# Patient Record
Sex: Female | Born: 1945 | Race: Asian | Hispanic: No | Smoking: Former smoker
Health system: Southern US, Community
[De-identification: ages and names within clinical notes are randomized; demographics above are authoritative.]

## PROBLEM LIST (undated history)

## (undated) DIAGNOSIS — K219 Gastro-esophageal reflux disease without esophagitis: Secondary | ICD-10-CM

## (undated) DIAGNOSIS — I10 Essential (primary) hypertension: Secondary | ICD-10-CM

## (undated) DIAGNOSIS — R1013 Epigastric pain: Secondary | ICD-10-CM

## (undated) DIAGNOSIS — C49A0 Gastrointestinal stromal tumor, unspecified site: Secondary | ICD-10-CM

## (undated) DIAGNOSIS — M1990 Unspecified osteoarthritis, unspecified site: Secondary | ICD-10-CM

## (undated) DIAGNOSIS — K648 Other hemorrhoids: Secondary | ICD-10-CM

## (undated) DIAGNOSIS — R42 Dizziness and giddiness: Secondary | ICD-10-CM

## (undated) DIAGNOSIS — C801 Malignant (primary) neoplasm, unspecified: Secondary | ICD-10-CM

## (undated) DIAGNOSIS — K859 Acute pancreatitis without necrosis or infection, unspecified: Principal | ICD-10-CM

## (undated) HISTORY — DX: Unspecified osteoarthritis, unspecified site: M19.90

## (undated) HISTORY — PX: OTHER SURGICAL HISTORY: SHX169

## (undated) HISTORY — DX: Other hemorrhoids: K64.8

## (undated) HISTORY — PX: CHOLECYSTECTOMY: SHX55

## (undated) HISTORY — DX: Gastrointestinal stromal tumor, unspecified site: C49.A0

## (undated) HISTORY — DX: Epigastric pain: R10.13

## (undated) HISTORY — PX: ESOPHAGOGASTRODUODENOSCOPY: SHX1529

## (undated) HISTORY — PX: COLONOSCOPY: SHX174

## (undated) HISTORY — DX: Gastro-esophageal reflux disease without esophagitis: K21.9

## (undated) HISTORY — DX: Essential (primary) hypertension: I10

## (undated) HISTORY — DX: Dizziness and giddiness: R42

---

## 2000-04-05 ENCOUNTER — Emergency Department (HOSPITAL_COMMUNITY): Admission: EM | Admit: 2000-04-05 | Discharge: 2000-04-05 | Payer: Self-pay | Admitting: Emergency Medicine

## 2000-04-05 ENCOUNTER — Encounter: Payer: Self-pay | Admitting: Emergency Medicine

## 2000-06-30 ENCOUNTER — Emergency Department (HOSPITAL_COMMUNITY): Admission: EM | Admit: 2000-06-30 | Discharge: 2000-06-30 | Payer: Self-pay | Admitting: Emergency Medicine

## 2000-06-30 ENCOUNTER — Encounter: Payer: Self-pay | Admitting: Emergency Medicine

## 2000-07-14 ENCOUNTER — Encounter: Admission: RE | Admit: 2000-07-14 | Discharge: 2000-07-14 | Payer: Self-pay | Admitting: Internal Medicine

## 2000-07-14 ENCOUNTER — Encounter: Payer: Self-pay | Admitting: Internal Medicine

## 2000-08-09 ENCOUNTER — Other Ambulatory Visit: Admission: RE | Admit: 2000-08-09 | Discharge: 2000-08-09 | Payer: Self-pay | Admitting: Obstetrics and Gynecology

## 2000-08-18 ENCOUNTER — Encounter: Payer: Self-pay | Admitting: Obstetrics and Gynecology

## 2000-08-18 ENCOUNTER — Ambulatory Visit (HOSPITAL_COMMUNITY): Admission: RE | Admit: 2000-08-18 | Discharge: 2000-08-18 | Payer: Self-pay | Admitting: Obstetrics and Gynecology

## 2000-09-01 ENCOUNTER — Ambulatory Visit (HOSPITAL_COMMUNITY): Admission: RE | Admit: 2000-09-01 | Discharge: 2000-09-01 | Payer: Self-pay | Admitting: Gastroenterology

## 2000-09-01 ENCOUNTER — Encounter (INDEPENDENT_AMBULATORY_CARE_PROVIDER_SITE_OTHER): Payer: Self-pay | Admitting: *Deleted

## 2000-09-24 ENCOUNTER — Emergency Department (HOSPITAL_COMMUNITY): Admission: EM | Admit: 2000-09-24 | Discharge: 2000-09-24 | Payer: Self-pay | Admitting: Emergency Medicine

## 2000-09-24 ENCOUNTER — Encounter: Payer: Self-pay | Admitting: Emergency Medicine

## 2007-02-23 ENCOUNTER — Emergency Department (HOSPITAL_COMMUNITY): Admission: EM | Admit: 2007-02-23 | Discharge: 2007-02-23 | Payer: Self-pay | Admitting: Emergency Medicine

## 2007-03-15 ENCOUNTER — Inpatient Hospital Stay (HOSPITAL_COMMUNITY): Admission: EM | Admit: 2007-03-15 | Discharge: 2007-03-26 | Payer: Self-pay | Admitting: Emergency Medicine

## 2007-03-16 ENCOUNTER — Encounter: Payer: Self-pay | Admitting: Internal Medicine

## 2007-03-16 DIAGNOSIS — Z85028 Personal history of other malignant neoplasm of stomach: Secondary | ICD-10-CM | POA: Insufficient documentation

## 2007-03-18 ENCOUNTER — Encounter (INDEPENDENT_AMBULATORY_CARE_PROVIDER_SITE_OTHER): Payer: Self-pay | Admitting: General Surgery

## 2007-03-18 ENCOUNTER — Encounter: Payer: Self-pay | Admitting: Internal Medicine

## 2007-03-18 DIAGNOSIS — C49A0 Gastrointestinal stromal tumor, unspecified site: Secondary | ICD-10-CM

## 2007-03-18 HISTORY — DX: Gastrointestinal stromal tumor, unspecified site: C49.A0

## 2007-03-21 ENCOUNTER — Ambulatory Visit: Payer: Self-pay | Admitting: Internal Medicine

## 2007-04-06 ENCOUNTER — Encounter: Admission: RE | Admit: 2007-04-06 | Discharge: 2007-04-06 | Payer: Self-pay | Admitting: General Surgery

## 2007-04-06 ENCOUNTER — Inpatient Hospital Stay (HOSPITAL_COMMUNITY): Admission: EM | Admit: 2007-04-06 | Discharge: 2007-04-11 | Payer: Self-pay | Admitting: General Surgery

## 2007-04-11 ENCOUNTER — Encounter (INDEPENDENT_AMBULATORY_CARE_PROVIDER_SITE_OTHER): Payer: Self-pay | Admitting: *Deleted

## 2007-04-17 ENCOUNTER — Ambulatory Visit: Payer: Self-pay | Admitting: Gastroenterology

## 2007-04-20 HISTORY — PX: BREAST BIOPSY: SHX20

## 2007-04-26 ENCOUNTER — Encounter: Admission: RE | Admit: 2007-04-26 | Discharge: 2007-04-26 | Payer: Self-pay | Admitting: General Surgery

## 2007-04-29 ENCOUNTER — Inpatient Hospital Stay (HOSPITAL_COMMUNITY): Admission: EM | Admit: 2007-04-29 | Discharge: 2007-05-09 | Payer: Self-pay | Admitting: Emergency Medicine

## 2007-05-01 ENCOUNTER — Encounter (INDEPENDENT_AMBULATORY_CARE_PROVIDER_SITE_OTHER): Payer: Self-pay | Admitting: *Deleted

## 2007-05-03 ENCOUNTER — Encounter (INDEPENDENT_AMBULATORY_CARE_PROVIDER_SITE_OTHER): Payer: Self-pay | Admitting: *Deleted

## 2007-05-31 ENCOUNTER — Ambulatory Visit: Payer: Self-pay | Admitting: Hematology and Oncology

## 2007-06-01 ENCOUNTER — Encounter: Payer: Self-pay | Admitting: Internal Medicine

## 2007-06-01 LAB — COMPREHENSIVE METABOLIC PANEL
ALT: 17 U/L (ref 0–35)
Albumin: 4.3 g/dL (ref 3.5–5.2)
CO2: 27 mEq/L (ref 19–32)
Chloride: 107 mEq/L (ref 96–112)
Potassium: 4.3 mEq/L (ref 3.5–5.3)
Sodium: 142 mEq/L (ref 135–145)
Total Bilirubin: 0.7 mg/dL (ref 0.3–1.2)
Total Protein: 6.8 g/dL (ref 6.0–8.3)

## 2007-06-01 LAB — CBC WITH DIFFERENTIAL/PLATELET
BASO%: 0.4 % (ref 0.0–2.0)
Eosinophils Absolute: 0.1 10*3/uL (ref 0.0–0.5)
LYMPH%: 23.5 % (ref 14.0–48.0)
MCHC: 34.6 g/dL (ref 32.0–36.0)
MONO#: 0.3 10*3/uL (ref 0.1–0.9)
NEUT#: 2.3 10*3/uL (ref 1.5–6.5)
RBC: 4.06 10*6/uL (ref 3.70–5.32)
RDW: 14.3 % (ref 11.3–14.5)
WBC: 3.5 10*3/uL — ABNORMAL LOW (ref 3.9–10.0)
lymph#: 0.8 10*3/uL — ABNORMAL LOW (ref 0.9–3.3)

## 2007-06-01 LAB — LACTATE DEHYDROGENASE: LDH: 132 U/L (ref 94–250)

## 2007-06-08 ENCOUNTER — Ambulatory Visit (HOSPITAL_COMMUNITY): Admission: RE | Admit: 2007-06-08 | Discharge: 2007-06-08 | Payer: Self-pay | Admitting: Hematology and Oncology

## 2007-06-09 ENCOUNTER — Ambulatory Visit: Payer: Self-pay | Admitting: Internal Medicine

## 2007-06-13 ENCOUNTER — Ambulatory Visit: Payer: Self-pay | Admitting: Internal Medicine

## 2007-06-22 LAB — CBC WITH DIFFERENTIAL/PLATELET
BASO%: 0 % (ref 0.0–2.0)
EOS%: 4.2 % (ref 0.0–7.0)
LYMPH%: 23.3 % (ref 14.0–48.0)
MCHC: 34.3 g/dL (ref 32.0–36.0)
MCV: 83.6 fL (ref 81.0–101.0)
MONO%: 10.4 % (ref 0.0–13.0)
NEUT#: 3.2 10*3/uL (ref 1.5–6.5)
Platelets: 191 10*3/uL (ref 145–400)
RBC: 4.78 10*6/uL (ref 3.70–5.32)
RDW: 13.3 % (ref 11.3–14.5)

## 2007-06-28 ENCOUNTER — Ambulatory Visit (HOSPITAL_COMMUNITY): Admission: RE | Admit: 2007-06-28 | Discharge: 2007-06-28 | Payer: Self-pay | Admitting: Hematology and Oncology

## 2007-06-28 ENCOUNTER — Encounter: Admission: RE | Admit: 2007-06-28 | Discharge: 2007-06-28 | Payer: Self-pay | Admitting: Hematology and Oncology

## 2007-06-28 ENCOUNTER — Encounter (INDEPENDENT_AMBULATORY_CARE_PROVIDER_SITE_OTHER): Payer: Self-pay | Admitting: Diagnostic Radiology

## 2007-07-03 ENCOUNTER — Encounter (INDEPENDENT_AMBULATORY_CARE_PROVIDER_SITE_OTHER): Payer: Self-pay | Admitting: *Deleted

## 2007-07-03 ENCOUNTER — Ambulatory Visit (HOSPITAL_COMMUNITY): Admission: RE | Admit: 2007-07-03 | Discharge: 2007-07-03 | Payer: Self-pay | Admitting: Hematology and Oncology

## 2007-08-07 ENCOUNTER — Ambulatory Visit: Payer: Self-pay | Admitting: Hematology and Oncology

## 2007-08-09 LAB — CBC WITH DIFFERENTIAL/PLATELET
BASO%: 0.4 % (ref 0.0–2.0)
HCT: 36.3 % (ref 34.8–46.6)
MCHC: 34.3 g/dL (ref 32.0–36.0)
MONO#: 0.3 10*3/uL (ref 0.1–0.9)
RBC: 4.35 10*6/uL (ref 3.70–5.32)
WBC: 5.1 10*3/uL (ref 3.9–10.0)
lymph#: 1.1 10*3/uL (ref 0.9–3.3)

## 2007-08-09 LAB — COMPREHENSIVE METABOLIC PANEL
ALT: 16 U/L (ref 0–35)
CO2: 25 mEq/L (ref 19–32)
Calcium: 8.9 mg/dL (ref 8.4–10.5)
Chloride: 104 mEq/L (ref 96–112)
Sodium: 138 mEq/L (ref 135–145)
Total Protein: 7.3 g/dL (ref 6.0–8.3)

## 2007-08-24 ENCOUNTER — Encounter: Payer: Self-pay | Admitting: Internal Medicine

## 2007-09-19 LAB — CBC WITH DIFFERENTIAL/PLATELET
BASO%: 0.5 % (ref 0.0–2.0)
Eosinophils Absolute: 0.1 10*3/uL (ref 0.0–0.5)
LYMPH%: 28.9 % (ref 14.0–48.0)
MCHC: 34.5 g/dL (ref 32.0–36.0)
MONO#: 0.2 10*3/uL (ref 0.1–0.9)
NEUT#: 1.8 10*3/uL (ref 1.5–6.5)
RBC: 4.24 10*6/uL (ref 3.70–5.32)
RDW: 18.6 % — ABNORMAL HIGH (ref 11.3–14.5)
WBC: 3.1 10*3/uL — ABNORMAL LOW (ref 3.9–10.0)
lymph#: 0.9 10*3/uL (ref 0.9–3.3)

## 2007-10-02 ENCOUNTER — Ambulatory Visit: Payer: Self-pay | Admitting: Hematology and Oncology

## 2007-10-04 ENCOUNTER — Encounter: Payer: Self-pay | Admitting: Internal Medicine

## 2007-10-04 LAB — COMPREHENSIVE METABOLIC PANEL
ALT: 13 U/L (ref 0–35)
CO2: 22 mEq/L (ref 19–32)
Calcium: 8.8 mg/dL (ref 8.4–10.5)
Chloride: 108 mEq/L (ref 96–112)
Creatinine, Ser: 0.87 mg/dL (ref 0.40–1.20)
Glucose, Bld: 133 mg/dL — ABNORMAL HIGH (ref 70–99)
Total Protein: 7 g/dL (ref 6.0–8.3)

## 2007-10-04 LAB — CBC WITH DIFFERENTIAL/PLATELET
BASO%: 0.3 % (ref 0.0–2.0)
Eosinophils Absolute: 0.1 10*3/uL (ref 0.0–0.5)
HCT: 35.6 % (ref 34.8–46.6)
HGB: 12.2 g/dL (ref 11.6–15.9)
MCHC: 34.4 g/dL (ref 32.0–36.0)
MONO#: 0.2 10*3/uL (ref 0.1–0.9)
NEUT#: 1.2 10*3/uL — ABNORMAL LOW (ref 1.5–6.5)
NEUT%: 49.4 % (ref 39.6–76.8)
WBC: 2.3 10*3/uL — ABNORMAL LOW (ref 3.9–10.0)
lymph#: 0.9 10*3/uL (ref 0.9–3.3)

## 2007-10-23 ENCOUNTER — Encounter: Payer: Self-pay | Admitting: Internal Medicine

## 2007-12-01 ENCOUNTER — Ambulatory Visit: Payer: Self-pay | Admitting: Hematology and Oncology

## 2007-12-05 ENCOUNTER — Encounter: Payer: Self-pay | Admitting: Internal Medicine

## 2007-12-05 LAB — CBC WITH DIFFERENTIAL/PLATELET
BASO%: 0.4 % (ref 0.0–2.0)
Eosinophils Absolute: 0.1 10*3/uL (ref 0.0–0.5)
HCT: 33.6 % — ABNORMAL LOW (ref 34.8–46.6)
LYMPH%: 29.6 % (ref 14.0–48.0)
MCHC: 34.8 g/dL (ref 32.0–36.0)
MONO#: 0.2 10*3/uL (ref 0.1–0.9)
NEUT#: 1.5 10*3/uL (ref 1.5–6.5)
NEUT%: 59.1 % (ref 39.6–76.8)
Platelets: 104 10*3/uL — ABNORMAL LOW (ref 145–400)
RBC: 3.64 10*6/uL — ABNORMAL LOW (ref 3.70–5.32)
WBC: 2.5 10*3/uL — ABNORMAL LOW (ref 3.9–10.0)
lymph#: 0.7 10*3/uL — ABNORMAL LOW (ref 0.9–3.3)

## 2007-12-05 LAB — COMPREHENSIVE METABOLIC PANEL
ALT: 15 U/L (ref 0–35)
CO2: 24 mEq/L (ref 19–32)
Calcium: 8.4 mg/dL (ref 8.4–10.5)
Chloride: 109 mEq/L (ref 96–112)
Glucose, Bld: 108 mg/dL — ABNORMAL HIGH (ref 70–99)
Sodium: 143 mEq/L (ref 135–145)
Total Protein: 6.4 g/dL (ref 6.0–8.3)

## 2008-02-05 ENCOUNTER — Ambulatory Visit (HOSPITAL_COMMUNITY): Admission: RE | Admit: 2008-02-05 | Discharge: 2008-02-05 | Payer: Self-pay | Admitting: Hematology and Oncology

## 2008-02-05 ENCOUNTER — Encounter (INDEPENDENT_AMBULATORY_CARE_PROVIDER_SITE_OTHER): Payer: Self-pay | Admitting: *Deleted

## 2008-02-12 ENCOUNTER — Ambulatory Visit: Payer: Self-pay | Admitting: Hematology and Oncology

## 2008-02-14 ENCOUNTER — Encounter: Payer: Self-pay | Admitting: Internal Medicine

## 2008-02-14 LAB — CBC WITH DIFFERENTIAL/PLATELET
Basophils Absolute: 0 10*3/uL (ref 0.0–0.1)
Eosinophils Absolute: 0.3 10*3/uL (ref 0.0–0.5)
HCT: 33.9 % — ABNORMAL LOW (ref 34.8–46.6)
HGB: 11.4 g/dL — ABNORMAL LOW (ref 11.6–15.9)
LYMPH%: 22.1 % (ref 14.0–48.0)
MONO#: 0.3 10*3/uL (ref 0.1–0.9)
NEUT#: 3.2 10*3/uL (ref 1.5–6.5)
NEUT%: 65.7 % (ref 39.6–76.8)
Platelets: 127 10*3/uL — ABNORMAL LOW (ref 145–400)
WBC: 4.8 10*3/uL (ref 3.9–10.0)
lymph#: 1.1 10*3/uL (ref 0.9–3.3)

## 2008-02-14 LAB — COMPREHENSIVE METABOLIC PANEL
ALT: 15 U/L (ref 0–35)
CO2: 25 mEq/L (ref 19–32)
Calcium: 8.6 mg/dL (ref 8.4–10.5)
Chloride: 111 mEq/L (ref 96–112)
Creatinine, Ser: 1.01 mg/dL (ref 0.40–1.20)
Glucose, Bld: 103 mg/dL — ABNORMAL HIGH (ref 70–99)
Total Bilirubin: 0.6 mg/dL (ref 0.3–1.2)

## 2008-03-04 ENCOUNTER — Ambulatory Visit: Payer: Self-pay | Admitting: Internal Medicine

## 2008-03-08 ENCOUNTER — Encounter (INDEPENDENT_AMBULATORY_CARE_PROVIDER_SITE_OTHER): Payer: Self-pay | Admitting: *Deleted

## 2008-03-08 ENCOUNTER — Ambulatory Visit: Payer: Self-pay | Admitting: Gastroenterology

## 2008-03-08 ENCOUNTER — Inpatient Hospital Stay (HOSPITAL_COMMUNITY): Admission: EM | Admit: 2008-03-08 | Discharge: 2008-03-11 | Payer: Self-pay | Admitting: Emergency Medicine

## 2008-03-10 ENCOUNTER — Ambulatory Visit: Payer: Self-pay | Admitting: Hematology and Oncology

## 2008-03-11 ENCOUNTER — Encounter: Payer: Self-pay | Admitting: Gastroenterology

## 2008-03-11 ENCOUNTER — Encounter: Payer: Self-pay | Admitting: Internal Medicine

## 2008-03-22 ENCOUNTER — Ambulatory Visit: Payer: Self-pay | Admitting: Internal Medicine

## 2008-03-22 DIAGNOSIS — R1013 Epigastric pain: Secondary | ICD-10-CM | POA: Insufficient documentation

## 2008-03-22 DIAGNOSIS — R112 Nausea with vomiting, unspecified: Secondary | ICD-10-CM | POA: Insufficient documentation

## 2008-03-22 HISTORY — DX: Epigastric pain: R10.13

## 2008-03-29 ENCOUNTER — Ambulatory Visit (HOSPITAL_COMMUNITY): Admission: RE | Admit: 2008-03-29 | Discharge: 2008-03-29 | Payer: Self-pay | Admitting: Internal Medicine

## 2008-04-01 ENCOUNTER — Ambulatory Visit: Payer: Self-pay | Admitting: Internal Medicine

## 2008-04-01 DIAGNOSIS — G4700 Insomnia, unspecified: Secondary | ICD-10-CM | POA: Insufficient documentation

## 2008-04-04 ENCOUNTER — Telehealth: Payer: Self-pay | Admitting: Internal Medicine

## 2008-04-05 ENCOUNTER — Ambulatory Visit: Payer: Self-pay | Admitting: Hematology and Oncology

## 2008-04-09 LAB — CBC WITH DIFFERENTIAL/PLATELET
BASO%: 0.5 % (ref 0.0–2.0)
Eosinophils Absolute: 0.1 10*3/uL (ref 0.0–0.5)
LYMPH%: 34.4 % (ref 14.0–48.0)
MCHC: 34.8 g/dL (ref 32.0–36.0)
MONO#: 0.2 10*3/uL (ref 0.1–0.9)
NEUT#: 1.6 10*3/uL (ref 1.5–6.5)
Platelets: 132 10*3/uL — ABNORMAL LOW (ref 145–400)
RBC: 3.7 10*6/uL (ref 3.70–5.32)
RDW: 13.7 % (ref 11.3–14.5)
WBC: 3 10*3/uL — ABNORMAL LOW (ref 3.9–10.0)
lymph#: 1 10*3/uL (ref 0.9–3.3)

## 2008-06-04 ENCOUNTER — Ambulatory Visit: Payer: Self-pay | Admitting: Internal Medicine

## 2008-06-04 DIAGNOSIS — R51 Headache: Secondary | ICD-10-CM | POA: Insufficient documentation

## 2008-06-04 DIAGNOSIS — R42 Dizziness and giddiness: Secondary | ICD-10-CM | POA: Insufficient documentation

## 2008-06-04 DIAGNOSIS — R519 Headache, unspecified: Secondary | ICD-10-CM | POA: Insufficient documentation

## 2008-06-04 LAB — CONVERTED CEMR LAB
BUN: 16 mg/dL (ref 6–23)
Creatinine, Ser: 0.8 mg/dL (ref 0.4–1.2)

## 2008-06-09 ENCOUNTER — Encounter: Admission: RE | Admit: 2008-06-09 | Discharge: 2008-06-09 | Payer: Self-pay | Admitting: Internal Medicine

## 2008-06-10 ENCOUNTER — Ambulatory Visit: Payer: Self-pay | Admitting: Hematology and Oncology

## 2008-06-12 ENCOUNTER — Encounter: Payer: Self-pay | Admitting: Internal Medicine

## 2008-06-12 LAB — CBC WITH DIFFERENTIAL/PLATELET
Basophils Absolute: 0 10*3/uL (ref 0.0–0.1)
Eosinophils Absolute: 0.2 10*3/uL (ref 0.0–0.5)
HGB: 12.2 g/dL (ref 11.6–15.9)
LYMPH%: 23.7 % (ref 14.0–49.7)
MCV: 92 fL (ref 79.5–101.0)
MONO#: 0.3 10*3/uL (ref 0.1–0.9)
MONO%: 8.8 % (ref 0.0–14.0)
NEUT#: 1.9 10*3/uL (ref 1.5–6.5)
Platelets: 171 10*3/uL (ref 145–400)

## 2008-06-12 LAB — COMPREHENSIVE METABOLIC PANEL
Albumin: 4.4 g/dL (ref 3.5–5.2)
Alkaline Phosphatase: 81 U/L (ref 39–117)
BUN: 20 mg/dL (ref 6–23)
CO2: 25 mEq/L (ref 19–32)
Glucose, Bld: 103 mg/dL — ABNORMAL HIGH (ref 70–99)
Potassium: 4.2 mEq/L (ref 3.5–5.3)
Total Bilirubin: 0.5 mg/dL (ref 0.3–1.2)

## 2008-06-12 LAB — LACTATE DEHYDROGENASE: LDH: 154 U/L (ref 94–250)

## 2008-07-01 ENCOUNTER — Encounter: Payer: Self-pay | Admitting: Internal Medicine

## 2008-09-30 ENCOUNTER — Ambulatory Visit: Payer: Self-pay | Admitting: Hematology and Oncology

## 2008-10-02 ENCOUNTER — Encounter (INDEPENDENT_AMBULATORY_CARE_PROVIDER_SITE_OTHER): Payer: Self-pay | Admitting: *Deleted

## 2008-10-02 ENCOUNTER — Ambulatory Visit (HOSPITAL_COMMUNITY): Admission: RE | Admit: 2008-10-02 | Discharge: 2008-10-02 | Payer: Self-pay | Admitting: Hematology and Oncology

## 2008-10-02 LAB — CBC WITH DIFFERENTIAL/PLATELET
Basophils Absolute: 0 10*3/uL (ref 0.0–0.1)
Eosinophils Absolute: 0 10*3/uL (ref 0.0–0.5)
HGB: 14.8 g/dL (ref 11.6–15.9)
LYMPH%: 17.8 % (ref 14.0–49.7)
MCV: 87 fL (ref 79.5–101.0)
MONO%: 10.9 % (ref 0.0–14.0)
NEUT#: 2.4 10*3/uL (ref 1.5–6.5)
NEUT%: 69.4 % (ref 38.4–76.8)
Platelets: 149 10*3/uL (ref 145–400)
RBC: 4.89 10*6/uL (ref 3.70–5.45)

## 2008-10-02 LAB — COMPREHENSIVE METABOLIC PANEL
Alkaline Phosphatase: 88 U/L (ref 39–117)
BUN: 12 mg/dL (ref 6–23)
Creatinine, Ser: 0.69 mg/dL (ref 0.40–1.20)
Glucose, Bld: 106 mg/dL — ABNORMAL HIGH (ref 70–99)
Total Bilirubin: 0.9 mg/dL (ref 0.3–1.2)

## 2008-10-06 ENCOUNTER — Encounter (INDEPENDENT_AMBULATORY_CARE_PROVIDER_SITE_OTHER): Payer: Self-pay | Admitting: *Deleted

## 2008-10-06 ENCOUNTER — Emergency Department (HOSPITAL_COMMUNITY): Admission: EM | Admit: 2008-10-06 | Discharge: 2008-10-07 | Payer: Self-pay | Admitting: Emergency Medicine

## 2008-10-09 ENCOUNTER — Encounter: Payer: Self-pay | Admitting: Internal Medicine

## 2009-03-28 ENCOUNTER — Ambulatory Visit: Payer: Self-pay | Admitting: Hematology and Oncology

## 2009-04-01 ENCOUNTER — Ambulatory Visit (HOSPITAL_COMMUNITY): Admission: RE | Admit: 2009-04-01 | Discharge: 2009-04-01 | Payer: Self-pay | Admitting: Hematology and Oncology

## 2009-04-01 ENCOUNTER — Encounter (INDEPENDENT_AMBULATORY_CARE_PROVIDER_SITE_OTHER): Payer: Self-pay | Admitting: *Deleted

## 2009-04-01 LAB — CBC WITH DIFFERENTIAL/PLATELET
Basophils Absolute: 0 10*3/uL (ref 0.0–0.1)
EOS%: 2.8 % (ref 0.0–7.0)
MCH: 30.8 pg (ref 25.1–34.0)
MCHC: 34.4 g/dL (ref 31.5–36.0)
MCV: 89.5 fL (ref 79.5–101.0)
MONO%: 7.9 % (ref 0.0–14.0)
RBC: 4.47 10*6/uL (ref 3.70–5.45)
RDW: 12.8 % (ref 11.2–14.5)

## 2009-04-01 LAB — COMPREHENSIVE METABOLIC PANEL
AST: 25 U/L (ref 0–37)
Albumin: 4 g/dL (ref 3.5–5.2)
Alkaline Phosphatase: 78 U/L (ref 39–117)
BUN: 14 mg/dL (ref 6–23)
Potassium: 4.7 mEq/L (ref 3.5–5.3)

## 2009-04-28 ENCOUNTER — Ambulatory Visit: Payer: Self-pay | Admitting: Hematology and Oncology

## 2009-04-30 ENCOUNTER — Encounter: Payer: Self-pay | Admitting: Internal Medicine

## 2009-08-20 ENCOUNTER — Ambulatory Visit: Payer: Self-pay | Admitting: Hematology and Oncology

## 2009-08-21 ENCOUNTER — Encounter (INDEPENDENT_AMBULATORY_CARE_PROVIDER_SITE_OTHER): Payer: Self-pay | Admitting: *Deleted

## 2009-08-21 ENCOUNTER — Ambulatory Visit (HOSPITAL_COMMUNITY): Admission: RE | Admit: 2009-08-21 | Discharge: 2009-08-21 | Payer: Self-pay | Admitting: Hematology and Oncology

## 2009-08-21 LAB — CBC WITH DIFFERENTIAL/PLATELET
BASO%: 0.5 % (ref 0.0–2.0)
LYMPH%: 24.7 % (ref 14.0–49.7)
MCHC: 34.2 g/dL (ref 31.5–36.0)
MONO#: 0.4 10*3/uL (ref 0.1–0.9)
Platelets: 182 10*3/uL (ref 145–400)
RBC: 4.76 10*6/uL (ref 3.70–5.45)
WBC: 4 10*3/uL (ref 3.9–10.3)

## 2009-08-21 LAB — COMPREHENSIVE METABOLIC PANEL
ALT: 20 U/L (ref 0–35)
Alkaline Phosphatase: 106 U/L (ref 39–117)
CO2: 28 mEq/L (ref 19–32)
Sodium: 140 mEq/L (ref 135–145)
Total Bilirubin: 1.1 mg/dL (ref 0.3–1.2)
Total Protein: 7.8 g/dL (ref 6.0–8.3)

## 2009-08-21 LAB — LACTATE DEHYDROGENASE: LDH: 142 U/L (ref 94–250)

## 2009-08-27 ENCOUNTER — Encounter: Payer: Self-pay | Admitting: Internal Medicine

## 2010-02-20 ENCOUNTER — Ambulatory Visit: Payer: Self-pay | Admitting: Hematology and Oncology

## 2010-02-24 LAB — COMPREHENSIVE METABOLIC PANEL
ALT: 27 U/L (ref 0–35)
AST: 30 U/L (ref 0–37)
Creatinine, Ser: 0.73 mg/dL (ref 0.40–1.20)
Total Bilirubin: 0.8 mg/dL (ref 0.3–1.2)

## 2010-02-24 LAB — CBC WITH DIFFERENTIAL/PLATELET
BASO%: 0.4 % (ref 0.0–2.0)
EOS%: 2.9 % (ref 0.0–7.0)
HCT: 41.2 % (ref 34.8–46.6)
LYMPH%: 24.7 % (ref 14.0–49.7)
MCH: 29.6 pg (ref 25.1–34.0)
MCHC: 34.1 g/dL (ref 31.5–36.0)
MCV: 86.9 fL (ref 79.5–101.0)
MONO%: 7.2 % (ref 0.0–14.0)
NEUT%: 64.8 % (ref 38.4–76.8)
Platelets: 185 10*3/uL (ref 145–400)

## 2010-03-05 ENCOUNTER — Encounter: Payer: Self-pay | Admitting: Internal Medicine

## 2010-04-17 ENCOUNTER — Ambulatory Visit: Payer: Self-pay | Admitting: Internal Medicine

## 2010-04-21 ENCOUNTER — Ambulatory Visit
Admission: RE | Admit: 2010-04-21 | Discharge: 2010-04-21 | Payer: Self-pay | Source: Home / Self Care | Attending: Internal Medicine | Admitting: Internal Medicine

## 2010-04-21 ENCOUNTER — Encounter: Payer: Self-pay | Admitting: Internal Medicine

## 2010-04-24 ENCOUNTER — Encounter: Payer: Self-pay | Admitting: Internal Medicine

## 2010-05-09 ENCOUNTER — Other Ambulatory Visit: Payer: Self-pay | Admitting: Hematology and Oncology

## 2010-05-09 DIAGNOSIS — C49A0 Gastrointestinal stromal tumor, unspecified site: Secondary | ICD-10-CM

## 2010-05-10 ENCOUNTER — Encounter: Payer: Self-pay | Admitting: Hematology and Oncology

## 2010-05-10 ENCOUNTER — Encounter: Payer: Self-pay | Admitting: Internal Medicine

## 2010-05-21 NOTE — Procedures (Signed)
Summary: Upper Endoscopy  Patient: [name] Note: All result statuses are Final unless otherwise noted.  Tests: (1) Upper Endoscopy (EGD)   EGD Upper Endoscopy       DONE     [facility]     [address]     [place], [place]  [address]           ENDOSCOPY PROCEDURE REPORT           PATIENT:  Dawn, [name]  MR#:  #[id number]     BIRTHDATE:  [birth year], 64 yrs. 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  The [facility] [id number] endoscope was     introduced through the mouth and advanced to the second portion of     the duodenum, without limitations.  The instrument was slowly     withdrawn as the mucosa was fully examined.     <<PROCEDUREIMAGES>>           There was stenosis in the body and the antrum of the stomach.     Persistent stenosis of body-antrum junction in area of prior wedge     resection of (GIST).  There were columnar-type mucosal changes in     the distal esophagus, that could represent Barrett's esophagus.     Tiny  area of columnar change just above z-line (40 cm). With     standard forceps, a biopsy was obtained and sent to pathology.     Otherwise the examination was normal.    Retroflexed views revealed     no abnormalities.    The scope was then withdrawn from the patient     and the procedure completed.           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 Marvell Fuller office and schedule a follow-up     appointment for late February or early March           REPEAT EXAM:  await biopsies to determine           [name],  MD, [facility]           CC:  Arlan Organ, MD     Maurice Small, MD     Claud Kelp, MD     The Patient           n.     eSIGNED:   [name] at 04/21/2010 11:25 AM           Calla Kicks, #[id number]  Note: An exclamation mark (!) indicates a result that was not dispersed into the flowsheet. Document Creation Date: 04/22/2010 9:14 AM _______________________________________________________________________  (1) Order result status: Final Collection or observation date-time: 04/21/2010 11:08 Requested date-time:  Receipt date-time:  Reported date-time:  Referring Physician:   Ordering Physician: Stan Head [phone]) Specimen Source:  Source: Launa Grill Order Number: [id number] Lab site:

## 2010-05-21 NOTE — Letter (Signed)
Summary: EGD Instructions  [facility]  [address] [place], [place] [address]   Phone: [phone]  Fax: [phone]       [name]    23-Feb-1946    MRN: [id number]       Procedure Day /Date: Tuesday 04/21/10     Arrival Time: 9:30 am     Procedure Time: 10:30 am     Location of Procedure:                    _ x _  Endoscopy Center (4th Floor)  PREPARATION FOR ENDOSCOPY   On 04/21/10 THE DAY OF THE PROCEDURE:  1.   No solid foods, milk or milk products are allowed after midnight the night before your procedure.  2.   Do not drink anything colored red or purple.  Avoid juices with pulp.  No orange juice.  3.  You may drink clear liquids until 8:30 am, which is 2 hours before your procedure.                                                                                                CLEAR LIQUIDS INCLUDE: Water Jello Ice Popsicles Tea (sugar ok, no milk/cream) Powdered fruit flavored drinks Coffee (sugar ok, no milk/cream) Gatorade Juice: apple, white grape, white cranberry  Lemonade Clear bullion, consomm, broth Carbonated beverages (any kind) Strained chicken noodle soup Hard Candy   MEDICATION INSTRUCTIONS  Unless otherwise instructed, you should take regular prescription medications with a small sip of water as early as possible the morning of your procedure.                   OTHER INSTRUCTIONS  You will need a responsible adult at least 64 years of age to accompany you and drive you home.   This person must remain in the waiting room during your procedure.  Wear loose fitting clothing that is easily removed.  Leave jewelry and other valuables at home.  However, you may wish to bring a book to read or an iPod/MP3 player to listen to music as you wait for your procedure to start.  Remove all body piercing jewelry and leave at home.  Total time from sign-in until discharge is approximately 2-3 hours.  You should  go home directly after your procedure and rest.  You can resume normal activities the day after your procedure.  The day of your procedure you should not:   Drive   Make legal decisions   Operate machinery   Drink alcohol   Return to work  You will receive specific instructions about eating, activities and medications before you leave.    The above instructions have been reviewed and explained to me by   Lamona Curl CMA Duncan Dull)  April 17, 2010 11:11 AM     I fully understand and can verbalize these instructions _____________________________ Date12/30/11

## 2010-05-21 NOTE — Discharge Summary (Signed)
Summary: Partial Gastric Outlet Obstruction, Gastroparesis  NAME:  [name], [name]             ACCOUNT NO.:  [id number]      MEDICAL RECORD NO.:  [id number]          PATIENT TYPE:  INP      LOCATION:  1531                         [facility]ACILITY:  [facility]      PHYSICIAN:  Angelia Mould. Derrell Lolling, M.D.DATE OF BIRTH:  1945-09-30      DATE OF ADMISSION:  04/06/2007   DATE OF DISCHARGE:  04/11/2007                                  DISCHARGE SUMMARY      FINAL DIAGNOSES:   1. Partial gastric outlet obstruction, improved.   2. Gastroparesis.   3. Gastric bezoar, resolved.   4. Protein calorie malnutrition, and failure to thrive.   5. 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      OPERATIONS PERFORMED:  Upper endoscopy, date April 08, 2007.      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  She was operated upon on March 18, 2007, and we were able   to staple and wedge out the tumor along the greater curvature of the   stomach.  This did narrow the gastric antrum somewhat, but   postoperatively she did well, got her NG tube out, and resumed diet, and   went home.      Since her discharge, she has felt weak and has complained of early   satiety and with intermittent vomiting, although she continued to have   bowel movements.  An upper GI was performed on the date of this   admission and showed a large gastric bezoar and delayed emptying of   barium into the duodenum, but ultimately it did empty.  She had lab work   on the date of admission showing a hemoglobin of 13, white blood cell   count of 4900, BUN of 8, and creatinine 0.2, and normal amylase.      I felt the patient was becoming malnourished due to inadequate oral   intake and also at risk of dehydration, and chose to admit her to the   hospital  for hydration, nutritional support, and further evaluation of   her gastric motility      PHYSICAL EXAMINATION:  GENERAL:  A thin, pleasant [place] woman who   appears to be in mild distress.   ABDOMEN:  Soft.  Mild tenderness in the left upper quadrant.  Midline   scar well healed.  Positive bowel sounds.  Not obviously distended.      HOSPITAL COURSE:  The patient was admitted, started on IV fluids, and   ultimately on hyperalimentation with a PICC line.  She was seen by Dr.   Claudette Head.  With bowel rest, she felt better and had no further   vomiting.  Dr. Russella Dar did an endoscopy on April 08, 2007, and reported   that it showed minimal retained food.  It also showed some narrowing at  the surgical site, but it was felt that this would probably open up over   time, and it was not a fixed stricture.      We put the patient back on a liquid diet, which she tolerated okay for a   couple of days.  We treated her very conservatively.      On April 11, 2007, the patient was feeling much better and was asking   to go home.  We chose to let her go home and continue the home   hyperalimentation and just told her to drink a liquid diet as tolerated.   She was asked to return to see me in the office in about 7-10 days.               Angelia Mould. Derrell Lolling, M.D.   Electronically Signed            HMI/MEDQ  D:  06/18/2007  T:  06/19/2007  Job:  [id number]

## 2010-05-21 NOTE — Letter (Signed)
Summary: [facility]  [facility]   Imported By: Lester [place] 03/18/2010 08:51:11  _____________________________________________________________________  External Attachment:    Type:   Image     Comment:   External Document

## 2010-05-21 NOTE — Letter (Signed)
[facility]   Imported By: Sherian Rein 09/11/2009 10:11:25  _____________________________________________________________________  External Attachment:    Type:   Image     Comment:   External Document

## 2010-05-21 NOTE — Miscellaneous (Signed)
Summary: omeprazole and amitriptylline rx  Clinical Lists Changes  Medications: Added new medication of OMEPRAZOLE 20 MG  CPDR (OMEPRAZOLE) 1 each day 30 minutes before meal - Signed Added new medication of AMITRIPTYLINE HCL 25 MG  TABS (AMITRIPTYLINE HCL) 1/2 tab nightly before bedtime for 1 week then 1 tab nightly - Signed Rx of OMEPRAZOLE 20 MG  CPDR (OMEPRAZOLE) 1 each day 30 minutes before meal;  #30 x 11;  Signed;  Entered by: [name] MD, [facility];  Authorized by: [name] MD, [facility];  Method used: Electronically to [facility]. [phone]*, [address] [place], [place], [place]  [address], Ph: [phone], Fax: [phone] Rx of AMITRIPTYLINE HCL 25 MG  TABS (AMITRIPTYLINE HCL) 1/2 tab nightly before bedtime for 1 week then 1 tab nightly;  #30 x 11;  Signed;  Entered by: [name] MD, [facility] Graham;  Authorized by: [name] MD, [facility];  Method used: Electronically to [facility]. [phone]*, [address], [place], [place], [place]  [address], Ph: [phone], Fax: [phone]    Prescriptions: AMITRIPTYLINE HCL 25 MG  TABS (AMITRIPTYLINE HCL) 1/2 tab nightly before bedtime for 1 week then 1 tab nightly  #30 x 11   Entered and Authorized by:   [name] MD, [facility]   Signed by:   [name] MD, [facility] on 04/21/2010   Method used:   Electronically to        [facility] [phone]* (retail)       [address]       [place], [place]  [address]       Ph: [phone]       Fax: [phone]   RxID:   [id number] OMEPRAZOLE 20 MG  CPDR (OMEPRAZOLE) 1 each day 30 minutes before meal  #30 x 11   Entered and Authorized by:   [name] MD, [facility]   Signed by:   [name] MD, FACG on 04/21/2010   Method used:   Electronically to        [facility] [phone]* (retail)       [address]       [place], [place]  [address]       Ph: [phone]       Fax: [phone]  RxID:   [id number]

## 2010-05-21 NOTE — Assessment & Plan Note (Signed)
Summary: reflux...as.    History of Present Illness Visit Type: Follow-up Visit Primary GI MD: Stan Head MD [facility] Primary Provider: Maurice Small, MD Requesting Provider: n/a Chief Complaint: GERD/ in am History of Present Illness:   65 yo [place] woman with prior resection of gastric GIST. She is having 2 months of epigastric burnng throughout the day with associated hot sensation in epigastrium and chest. she has regurgitation. Not sleeping well with this. She says she has vomited some. She feels bloated or swollen when she eats.  Here with family to help interpret. Patient speaks some English   GI Review of Systems    Reports acid reflux, bloating, heartburn, and  nausea.      Denies abdominal pain, belching, chest pain, dysphagia with liquids, dysphagia with solids, loss of appetite, vomiting, vomiting blood, weight loss, and  weight gain.        Denies anal fissure, black tarry stools, change in bowel habit, constipation, diarrhea, diverticulosis, fecal incontinence, heme positive stool, hemorrhoids, irritable bowel syndrome, jaundice, light color stool, liver problems, rectal bleeding, and  rectal pain.    Current Medications (verified): 1)  Norvasc 2.5 Mg Tabs (Amlodipine Besylate) .Marland Kitchen.. 1 By Mouth Once Daily  Allergies (verified): No Known Drug Allergies  Past History:  Past Medical History: Arthritis Hypertension  Past Surgical History: Reviewed history from 03/22/2008 and no changes required. GIST resection  Family History: Reviewed history from 03/22/2008 and no changes required. unremarkable  Social History: Reviewed history from 03/22/2008 and no changes required. Single Patient is a former smoker.  Alcohol Use - no Illicit Drug Use - no  Review of Systems       still gets dizzy at night - vertigo  Vital Signs:  Patient profile:   65 year old female Height:      60 inches Weight:      101.38 pounds BMI:     19.87 Pulse rate:   72 /  minute Pulse rhythm:   regular BP sitting:   104 / 68  (left arm) Cuff size:   regular  Vitals Entered By: June McMurray CMA Duncan Dull) (April 17, 2010 10:41 AM)  Physical Exam  General:  slightly frail Asian woman Eyes:  anicteric Mouth:  edentulous Lungs:  Clear throughout to auscultation. Heart:  Regular rate and rhythm; no murmurs, rubs,  or bruits. Abdomen:  tender to light touch over scar soft, no masses otherwise BS+   Impression & Recommendations:  Problem # 1:  EPIGASTRIC PAIN, CHRONIC (ICD-789.06) Assessment Deteriorated ? GERD vs recurrent functional abdominal pain that she has had in past - it was improved by amitriptyline in past  - this was stopped for unclear reasons Prilosec OTC once daily samples for now Risks, benefits,and indications of endoscopic procedure(s) were reviewed with the patient and all questions answered. 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 Please follow written prep instructions that were given to you today at your visit.  2)  We have given you samples of Prilosec OTC. You should take 1 (20 mg) tablet by mouth every morning. 3)  Copy sent to : Dr L.Odogwu, Dr Roseanne Reno 4)  The  medication list was reviewed and reconciled.  All changed / newly prescribed medications were explained.  A complete medication list was provided to the patient / caregiver.

## 2010-05-21 NOTE — Letter (Signed)
[facility]   Imported By: Sherian Rein 05/22/2009 07:24:55  _____________________________________________________________________  External Attachment:    Type:   Image     Comment:   External Document

## 2010-05-21 NOTE — Letter (Signed)
Summary: Patient [name] Biopsy Results  [facility]  [address] [place], [place] [address]   Phone: [phone]  Fax: [phone]        April 24, 2010 MRN: [id number]    [name] [address] [place], [place]  [address]    Dear [name],  I am pleased to inform you that the biopsies taken during your recent endoscopic examination did not show any problems. It was benign, essentially normal.  Continue with the treatment plan as outlined on the day of your      exam and see me in February or March as recommended..  Please call us if you are having persistent problems or have questions about your condition that have not been fully answered at this time.  Sincerely,  [name] MD, [facility]  This letter has been electronically signed by your physician.  Appended Document: Patient Notice-Endo Biopsy Results Letter mailed

## 2010-06-24 ENCOUNTER — Ambulatory Visit (INDEPENDENT_AMBULATORY_CARE_PROVIDER_SITE_OTHER): Payer: Medicaid Other | Admitting: Internal Medicine

## 2010-06-24 ENCOUNTER — Encounter: Payer: Self-pay | Admitting: Internal Medicine

## 2010-06-24 DIAGNOSIS — R1013 Epigastric pain: Secondary | ICD-10-CM

## 2010-06-30 NOTE — Assessment & Plan Note (Signed)
Summary: ABD PAIN F/U//SCH'D W/PT//MEDLIST//CX POLICY ADVISED    History of Present Illness Visit Type: Follow-up Visit Primary GI MD: Stan Head MD [facility] Primary Provider: Maurice Small, MD Requesting Provider: n/a Chief Complaint: GERD History of Present Illness:    65 year old [place] woman with chronic epigastric pain  after resection of GIST from the stomach. She was seen recently complaining of burning abdominal pain. Upper endoscopy did not show any significant pathology. She was started on omeprazole 20 mg daily and had amitriptyline restarted. She is here with her daughter who serves as an interpreter for the most part today. She is much better indicating that the medication is helping relieve her symptoms. It sounds like she is using the omeprazole daily and the amitriptyline on an as-needed basis mainly to help her sleep.   GI Review of Systems      Denies abdominal pain, acid reflux, belching, bloating, chest pain, dysphagia with liquids, dysphagia with solids, heartburn, loss of appetite, nausea, vomiting, vomiting blood, weight loss, and  weight gain.        Denies anal fissure, black tarry stools, change in bowel habit, constipation, diarrhea, diverticulosis, fecal incontinence, heme positive stool, hemorrhoids, irritable bowel syndrome, jaundice, light color stool, liver problems, rectal bleeding, and  rectal pain.    Current Medications (verified): 1)  Norvasc 2.5 Mg Tabs (Amlodipine Besylate) .Marland Kitchen.. 1 By Mouth Once Daily 2)  Omeprazole 20 Mg  Cpdr (Omeprazole) .Marland Kitchen.. 1 Each Day 30 Minutes Before Meal 3)  Amitriptyline Hcl 25 Mg  Tabs (Amitriptyline Hcl) .Marland Kitchen.. 1 Tab Nightly Before Bedtime  Allergies (verified): No Known Drug Allergies  Past History:  Past Medical History: Arthritis Hypertension Chronic Epigastric Pain after GIST resection  Past Surgical History: Reviewed history from 03/22/2008 and no changes required. GIST resection  Family  History: unremarkable No FH of Colon Cancer:  Social History: Unemployed Single Patient is a former smoker.  Alcohol Use - no Illicit Drug Use - no  Vital Signs:  Patient profile:   65 year old female Height:      60 inches Weight:      103 pounds BMI:     20.19 BSA:     1.41 Pulse rate:   76 / minute Pulse rhythm:   regular BP sitting:   120 / 64  (left arm) Cuff size:   regular  Vitals Entered By: Ok Anis CMA (June 24, 2010 4:05 PM)  Physical Exam  General:  Thin Asian woman in NAD.   Impression & Recommendations:  Problem # 1:  EPIGASTRIC PAIN, CHRONIC (ICD-789.06) Assessment Improved  She is doing well on omeprazole and what must be intermittent amitriptyline at this time. in the past I had thought the amitriptyline had benefit her morning more than anything. She will continue this regimen since she is improved.  I will see her in a year, sooner if needed. she is currently searching for a new primary care physician that is on the [employer].  15 minutes time spent with patient today over half counselling.  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 2)  Please continue current medications.  3)  The medication list was reviewed and reconciled.  All changed / newly prescribed medications were explained.  A complete medication list was provided to the patient / caregiver. Prescriptions: OMEPRAZOLE 20 MG  CPDR (OMEPRAZOLE) 1 each day 30 minutes  before meal  #30 x 11   Entered and Authorized by:   [name] MD, [facility]   Signed by:   [name] MD, [facility] on 06/24/2010   Method used:   Electronically to        [facility] [phone]* (retail)       [address]       [place], [place]  [address]       Ph: [phone]       Fax: [phone]   RxID:    [id number] AMITRIPTYLINE HCL 25 MG  TABS (AMITRIPTYLINE HCL) 1 tab nightly before bedtime  #30 x 11   Entered and Authorized by:   [name] MD, [facility]   Signed by:   [name] MD, FACG on 06/24/2010   Method used:   Electronically to        [facility]. [phone]* (retail)       [address]       [place], [place]  [address]       Ph: [phone]       Fax: [phone]   RxID:   [id number]  cc: Dr. Lysbeth Penner

## 2010-07-27 LAB — URINALYSIS, ROUTINE W REFLEX MICROSCOPIC
Bilirubin Urine: NEGATIVE
Hgb urine dipstick: NEGATIVE
Nitrite: NEGATIVE
Protein, ur: NEGATIVE mg/dL
Urobilinogen, UA: 0.2 mg/dL (ref 0.0–1.0)

## 2010-07-27 LAB — COMPREHENSIVE METABOLIC PANEL
ALT: 15 U/L (ref 0–35)
Alkaline Phosphatase: 77 U/L (ref 39–117)
CO2: 27 mEq/L (ref 19–32)
GFR calc non Af Amer: >60 mL/min (ref >60)
Glucose, Bld: 95 mg/dL (ref 70–99)
Potassium: 5.4 mEq/L — ABNORMAL HIGH (ref 3.5–5.1)
Sodium: 145 mEq/L (ref 135–145)
Total Bilirubin: 0.7 mg/dL (ref 0.3–1.2)

## 2010-07-27 LAB — CBC
Hemoglobin: 14.1 g/dL (ref 12.0–15.0)
RBC: 4.74 MIL/uL (ref 3.87–5.11)

## 2010-07-27 LAB — LACTIC ACID, PLASMA: Lactic Acid, Venous: 1.7 mmol/L (ref 0.5–2.2)

## 2010-07-27 LAB — URINE CULTURE

## 2010-07-27 LAB — DIFFERENTIAL
Basophils Relative: 0 % (ref 0–1)
Eosinophils Absolute: 0.1 10*3/uL (ref 0.0–0.7)
Monocytes Relative: 7 % (ref 3–12)
Neutrophils Relative %: 68 % (ref 43–77)

## 2010-07-27 LAB — LIPASE, BLOOD: Lipase: 48 U/L (ref 11–59)

## 2010-09-01 NOTE — H&P (Signed)
[name], HIRD NO.:  [id number]   MEDICAL RECORD NO.:  [id number]          PATIENT TYPE:  EMS   LOCATION:  ED                           FACILITY:  [facility]   PHYSICIAN:  [name], MD,FACGDATE OF BIRTH:  18-Dec-1945   DATE OF ADMISSION:  03/15/2007  DATE OF DISCHARGE:                              HISTORY & PHYSICAL   CHIEF COMPLAINT:  Vomiting blood.   HISTORY:  Ms. Dascoli is a generally healthy 65 year old [place] female  who was admitted through the emergency room.  She was brought in earlier  today per her family with complaints of vomiting blood at home.  She had  onset about 9:00 a.m. this morning with dizziness and weakness and then  apparently about noon began vomiting, initially with black dark  material, and then according to her family, eventually brighter blood.  She had about 6 bowel movements at home this morning as well, all of  black appearing.  She did not have any syncope, has no complaint of  chest pain but does complain of being very weak and dizzy.  She has not  had any previous bleeding problems or GI problems.  She uses Aleve  occasionally and has been taking 1-2 daily over the past few days for  generalized body aches.  She has no history of liver disease or  hepatitis that she is aware of.  Her family says that she has not been  feeling well over the past month or so with a decrease in appetite,  decrease in p.o. intake, complaints of heartburn and a burning in her  stomach and squeezing pain.  She has not had any dysphasia or  odynophagia.  Initially, in the emergency room, she was hypotensive with  blood pressure 80/60.  She responded to fluid bolus with blood pressure  up to 100 systolically.  Initial hemoglobin is 11.6.  Platelet count is  normal.  She is admitted at this time with an acute upper GI bleed for  supportive management and further diagnostic workup.   CURRENT MEDICATIONS:  None on a regular basis, occasional  Aleve.   ALLERGIES:  NO KNOWN DRUG ALLERGIES.   PAST HISTORY:  Completely benign.  No prior surgeries or known chronic  medical illnesses.   Labs in the ER show a WBC of 9.1, hemoglobin 11.2, hematocrit of 32.2,  MCV of 88, platelets 196, BUN of 49, creatinine 0.65.  LFTs normal.  Albumin 3.  UA is negative.  She was in the emergency room 11/06 with  complaints of a cough, hemoglobin was 14.3 at that time.   FAMILY HISTORY:  Negative for GI disease.   SOCIAL HISTORY:  The patient lives with her daughter.  She is  [place]  She does smoke 2-3 cigarettes per day.  No regular ETOH.   REVIEW OF SYSTEMS:  Difficult due to language barrier, but is as  outlined in the HPI.  She has complained of generalized body aches the  past few days.  GENITOURINARY:  Negative.  ENT: Negative.  CARDIOVASCULAR:  Negative.  RESPIRATORY: Negative.  All other review of  systems  negative except as outlined above.   PHYSICAL EXAMINATION:  GENERAL:  Well-developed, pale Asian female.  She  is alert.  Family is at her bedside.  VITAL SIGNS:  Blood pressure initially 82/60 with a pulse of 112; now  102/70, pulse in the 80s.  She does have some black emesis on her gown.  HEENT: Anicteric.  NECK:  Supple without nodes.  CARDIOVASCULAR:  Regular rate and rhythm with S1 and S2.  No murmur,  rub, or gallop.  PULMONARY:  Clear to A and P.  ABDOMEN:  Soft.  Bowel sounds are active.  She is tender in the  epigastrium.  There is no mass or hepatosplenomegaly felt.  No guarding  or rebound.  EXTREMITIES:  Without clubbing, cyanosis or edema.  NEURO:  Grossly nonfocal.  RECTAL:  Exam not done at this time.   IMPRESSION:  A 65 year old [place] female with acute upper  gastrointestinal bleed with associated hypotension; rule out peptic  ulcer disease or occult gastric lesion, rule out Mallory Weiss tear,  acute esophagitis.   PLAN:  The patient is admitted to the step-down unit for close  observation, volume  repletion, serial H&H's, transfuse as indicated.  She will be placed on a Protonix infusion and will be scheduled for  upper endoscopy.  For details ,please see the orders.      Amy Esterwood, PA-C      [name], MD,FACG  Electronically Signed    AE/MEDQ  D:  03/15/2007  T:  03/16/2007  Job:  [id number]

## 2010-09-01 NOTE — H&P (Signed)
[name], [name]             ACCOUNT NO.:  [id number]   MEDICAL RECORD NO.:  [id number]          PATIENT TYPE:  INP   LOCATION:  1502                         FACILITY:  [facility]   PHYSICIAN:  Angelia Mould. Derrell Lolling, M.D.DATE OF BIRTH:  1945/05/25   DATE OF ADMISSION:  04/29/2007  DATE OF DISCHARGE:                              HISTORY & PHYSICAL   CHIEF COMPLAINT:  Nausea, vomiting, diarrhea and fever.   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  She was operated  on on December 29 by Dr. Derrell Lolling with the tumor along the greater  curvature of the stomach and it left what was thought to be about a 3 cm  lumen along the lesser curvature.  She did well initially but then  started having a little nausea and vomiting.  Was able to tolerate a  diet and was discharged.  She had problems with nausea and vomiting and  was readmitted by Dr. Derrell Lolling on the, I am not sure of the date,  according to this it says she was discharged on the 7th and admitted on  the 8th of December.  I expect it was the other way.  She was endoscoped  during this admission and found to have a stricture of the midportion of  her stomach.  Since then she has got a PICC line and I think she has  received home TPN and had called Dr. Derrell Lolling earlier in the week and Dr.  Derrell Lolling told me that she was scheduled for upper endoscopy again by Dr.  Russella Dar at some time in the near future.  Today, this is Saturday, she  called and I was the physician on call and saying that she started  having diarrhea yesterday 2x and then 2x today and then also having  temperature elevation and persistent kind of nausea and vomiting and she  said she was really getting by before without the nausea and vomiting.  She was advised to come to the emergency room and was seen by the ER  physician who on  examination thought that she was mildly tender in her  right upper abdomen and obtained an ultrasound of the gallbladder.  This  showed a little bit of thickening in the gallbladder wall and I was  called to evaluate the patient.   She did arrive here at the emergency room by noon and her pulse was 130,  temperature 101.7.  Her pulse now since she has had an IV is down to  about 105.  Blood pressure is okay and her temperature was 101  approximately 4 p.m.  On physical exam I do not find her to have obvious  surgical abdomen.  She is kind of vaguely tender in the upper abdomen on  the right more so than the left and there is no tenderness around her  PICC line.  She appears to be resting comfortably, does not look like  she is toxic but does not  look like she feels well either.  I think that  what we need to do is admit her and do IV cultures and also get a urine  culture and also draw a blood culture through the PICC line as this  possibly could be related to infection of the PICC line since it has  been in now for over a month.  Her white count is low in the range of  1900.  When she was in December her white count was about 3000 so it  appears that she runs a low white count frequently.  I do not have the  etiology of her diarrhea which she is on kind of limited liquids and we  will get stool for C. difficile but this is probably not a C. difficile  issue problem.  I am going to order a CT of the abdomen to include the  lower chest.  It might show that we are not having acute gallbladder and  also that there is no pseudocyst or inflammation in the midline of her  abdomen from her surgery which is now nearly 7 weeks earlier.  Her  midline incision is well-healed and we will start her on Zosyn after she  has had the blood cultures and urine cultures.  We are going to admit  her to Dr. Jacinto Halim service.  We will get a CT this evening since it is  approximately 4 p.m.   This note was  dictated by Osvaldo Angst,  who evaluated the patient  in the [facility] on the date of admission.     ______________________________  [name], M.D.      Angelia Mould. Derrell Lolling, M.D.  Electronically Signed    WJW/MEDQ  D:  04/29/2007  T:  04/30/2007  Job:  [id number]

## 2010-09-01 NOTE — Op Note (Signed)
NAMECAYCEE, [name]             ACCOUNT NO.:  [id number]   MEDICAL RECORD NO.:  [id number]          PATIENT TYPE:  INP   LOCATION:  1527                         [facility]ACILITY:  [facility]   PHYSICIAN:  Angelia Mould. Derrell Lolling, M.D.DATE OF BIRTH:  [birth year]   DATE OF PROCEDURE:  03/18/2007  DATE OF DISCHARGE:                               OPERATIVE REPORT   PREOPERATIVE DIAGNOSIS:  Gastrointestinal stromal tumor of the stomach.   POSTOPERATIVE DIAGNOSIS:  Gastrointestinal stromal tumor of the stomach.   OPERATION PERFORMED:  Exploratory laparotomy, wedge resection GIST  (gastrointestinal stromal tumor) of the stomach.   SURGEON:  Angelia Mould. Derrell Lolling, M.D.   FIRST ASSISTANT:  Alfonse Ras, M.D.   OPERATIVE INDICATIONS:  This is a 66 year old [place] female who was  admitted on November 26 with hematemesis, melena, dizziness, and  significant anemia.  She was transfused and clinically her bleeding  stopped.  Upper endoscopy showed what appeared to be a submucosal tumor  in the mid portion of the stomach with central ulceration consistent  with recent hemorrhage.  CT scan showed a dumbbell-shaped tumor mass in  the stomach, part of it intraluminal and part of it extraluminal, along  the greater curvature.  There was no adenopathy or signs of any other  problem.  She was advised to have this area resected.  She was brought  to the operating room electively.   OPERATIVE FINDINGS:  The patient had a dumbbell-shaped tumor along the  greater curvature of the stomach.  There was about a 3.5-cm intraluminal  component and about 3.5-cm extraluminal component.  The attachment of  the tumor was along the greater curvature.  I was able to resect this  using the GIA stapler as a wedge resection and this left about 2.0-cm  lumen along the lesser curvature at its narrowest point with good  vascular supply.  The liver felt normal.  There was no adenopathy along  the aorta or the porta hepatis or the  omentum or the colon.  The spleen  felt normal to slightly enlarged but smooth.   OPERATIVE TECHNIQUE:  Following the induction of general endotracheal  anesthesia, a nasogastric tube was placed.  The abdomen was prepped and  draped in a sterile fashion.  The patient was identified as the correct  patient and correct procedure.  Upper midline laparotomy incision was  made.  The abdomen was entered and explored with findings as described  above.  Self-retaining retractors were placed.   I mobilized the stomach by dividing some of the gastrocolic omentum.  I  did this using the LigaSure device.  Larger vessels were clamped and  tied with 2-0 silk ties or 2-0 silk suture ligatures.  This allowed me  to get circumferentially around the stomach.  I took a little bit of the  lesser curvature down.  Once I had everything completely mobilized, it  was apparent to me that the tumor was soft and had benign physical  characteristics.  It appeared to be attached to the greater curvature of  the stomach.  I chose to try to perform a wedge resection.  I used a GIA  stapler and was able to tether the tumor up and fired the GIA 75 mm  staple a single time across the greater curvature of the stomach and  removed the specimen.  I oversewed the staple line with a running suture  of 2-0 Vicryl.  I inspected the lumen at its narrowest point, and it was  about 2 cm.  I easily passed the nasogastric tube through this.  I had  some bleeding from some of the gastrocolic omentum which required  further suture ligatures and then we had good hemostasis.   I took the specimen to the side table and cut the old staple line out.  I laid everything out.  It looked like I had good margins all the way  around.  Dr. Colin Benton inspected this and agreed.  In some areas, I had  about 4 cm of margin.  In the narrowest place, I had about a 2-cm margin  but it was on stretch and about a 1-cm margin when it was allowed to  retract.   Nevertheless, we had clean margins all around grossly.  I felt  this was adequate, and the specimen was sent for routine histology.   We irrigated out the abdomen and pelvis with saline.  A few blood clots  were removed.  We inspected all the areas of dissection.  There was no  bleeding anywhere.  We reinspected the nasogastric tube position, passed  the staple line and it looked good.  Retractors were removed.  The  midline fascia was closed with a running suture of #1 PDS and skin  closed with skin staples.  Clean bandages were placed and the patient  taken to the recovery room in stable condition.  Estimated blood loss  was about 100 to 250 mL.   COMPLICATIONS:  None.   SPONGE, NEEDLE AND INSTRUMENT COUNTS:  Correct.      Angelia Mould. Derrell Lolling, M.D.  Electronically Signed     HMI/MEDQ  D:  03/18/2007  T:  03/18/2007  Job:  [id number]   cc:   [name], MD,FACG  [facility]  [address] [place], [place] [address]

## 2010-09-01 NOTE — H&P (Signed)
NAMEMAKENLEY, SHIMP NO.:  [id number]   MEDICAL RECORD NO.:  [id number]          PATIENT TYPE:  EMS   LOCATION:  ED                           FACILITY:  [facility]   PHYSICIAN:  Ramiro Harvest, MD    DATE OF BIRTH:  February 25, 1946   DATE OF ADMISSION:  03/08/2008  DATE OF DISCHARGE:                              HISTORY & PHYSICAL   PRIMARY CARE PHYSICIAN:  Dr. Kirby Funk of [facility]   GASTROENTEROLOGIST:  Dr. Marina Goodell of [facility].   ONCOLOGIST:  Dr. Dalene Carrow.   HPI:  Ms. Glas is a 65 year old [place] female who speaks very  little English and as such most of the history was obtained from a  neighbor and her daughter.  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  Patient stated the abdominal pain was squeezing and cramping  in nature, was a 6 to a 7/10 with no radiation and not constant in  nature.  Patient also states that she had several normal bowel movements  on the day of admission.  Patient denies any fever.  No cough.  No upper  respiratory symptoms.  No shortness of breath.  No chest pain.  No  melena.  No hematemesis.  No hematochezia.  No focal neurological  symptoms.  No use of NSAIDs.  No recent travel.  No change in diet.  Patient does endorse some dysuria.  Patient was seen in the ED.  CBC  showed a white count of 2.3, hemoglobin of 11.7, platelets of 125,  hematocrit of 34.2.  Coags were within normal limits.  CMET with a  calcium of 8.2, protein of 5.8, otherwise was within normal limits.  Lipase of 50.  EKG showed accelerated junctional rhythm.  Point of care  cardiac markers were negative.  UA was negative.  CT of the head was  negative.  Acute abdominal  series was negative.  We were called to admit  the patient for further evaluation and management.   ALLERGIES:  NO KNOWN DRUG ALLERGIES.   PAST MEDICAL HISTORY:  1. A GI stromal tumor status post wedge resection, March 18, 2007,      per Dr. Derrell Lolling.  2. Hypertension during the hospitalization of December 2008.  3. History of gastroparesis.  4. History of partial gastric outlet obstruction, April 06, 2007.  5. Gastric bezoar which resolved on April 06, 2007.  6. Failure to thrive.  7. 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  8. A gram-negative bacteremia secondary to an infected PICC line.   MEDICATIONS:  1. Gleevec 400 mg p.o. daily.  2. Zofran 8 mg p.o. every 12 hours as needed.   SOCIAL HISTORY:  Patient lives with her daughter in [place] and  patient is [place]  Prior tobacco history, quit in 2008.  No alcohol  use.  No IV drug use.   FAMILY HISTORY:  Noncontributory.   REVIEW OF SYSTEMS:  As per HPI, otherwise negative.   PHYSICAL EXAM:  Temperature 97.7.  blood pressure 159/53.  Pulse of 91.  Respiratory rate 20.  Satting 97% on room air.  GENERAL:  Patient in bed in no apparent distress.  HEENT: Normocephalic, atraumatic.  Pupils equal, round, and reactive to  light and accommodation.  Extraocular movements intact.  Oropharynx is  clear.  No lesions.  No exudates.  NECK:  Supple.  No lymphadenopathy.  Dry mucous membranes.  RESPIRATORY:  Lungs are clear to auscultation bilaterally.  No crackles.  No wheezes.  No rhonchi.  CARDIOVASCULAR:  Regular rate and rhythm.  No murmurs, rubs, or gallops.  ABDOMEN:  Soft, nondistended, and tender to palpation in the epigastric  region greater than the right upper quadrant.  Positive bowel sounds.  EXTREMITIES:  No clubbing, cyanosis, or edema.  NEUROLOGICAL:  Patient is alert and oriented x3.  Cranial nerves II-XII  are grossly intact.  No focal deficits.   LABS:  CBC, white count to  2.3, hemoglobin 11.7, platelets 125,  hematocrit 34.2, ANC of 1.5, PT of 15.0, INR of 1.1, sodium 140,  potassium 4.4, chloride 112, bicarb 26, BUN 13, creatinine 0.89, glucose  167, bilirubin 0.8, alk phosphatase 60, AST 24, ALT 17, protein 5.8,  albumin 3.6, calcium of 8.2, lipase of 50.  EKG showed accelerated  junctional rhythm.  No ST-T wave changes.  UA was negative for nitrite,  negative for leukocytes.  Point of care cardiac markers, CK-MB less than  1.0, myoglobin 39.0, troponin I less than 0.05.  CT of the head without  contrast showed no acute intracranial abnormality, mild cerebral  atrophia.  Acute abdominal series shows no acute abdominal or pulmonary  abnormality.   ASSESSMENT AND PLAN:  1. Nausea and emesis, questionable etiology, and the differential      includes a gastric outlet obstruction versus gastroparesis versus      an infection versus pancreatitis versus cholecystitis versus peptic      ulcer disease versus recurrence of gastrointestinal stromal tumor.      We will admit the patient.  Check a CT of the abdomen and pelvis.      Check blood cultures x2.  Hydrate with intravenous fluids, bowel      rest, intravenous Reglan, and we will place on empiric intravenous      antibiotics of Zosyn for now.  We will consult with GI for further      evaluation and management.  2. Dehydration, intravenous fluids.  3. Dizziness, likely secondary to dehydration.  We will check      orthostasis.  We will place on intravenous fluids and monitor.  4. History of gastrointestinal stromal tumor status post wedge      resection.  Continue home dose Gleevec.  May need to inform Dr.      Dalene Carrow of patient's admission.  5. Prophylaxis.  Protonix for gastrointestinal prophylaxis.      Sequential compression devices for deep venous thrombosis      prophylaxis.   It has been a pleasure taking care of Ms. Kensinger.      Ramiro Harvest, MD  Electronically Signed     DT/MEDQ  D:   03/08/2008  T:  03/08/2008  Job:  [id number]   cc:   [name], M.D.  Fax: [phone]   Wilhemina Bonito. Marina Goodell, MD  [address] [facility]  [place]  [place] [address]   Angelia Mould. Derrell Lolling, M.D.  [address]  [place]  [place] [address]   Vicente Serene I. Odogwu, M.D.  Fax: [phone]

## 2010-09-01 NOTE — Consult Note (Signed)
NAMEJAYMES, REVELS             ACCOUNT NO.:  [id number]   MEDICAL RECORD NO.:  [id number]          PATIENT TYPE:  INP   LOCATION:  1527                         FACILITY:  [facility]   PHYSICIAN:  Angelia Mould. Derrell Lolling, M.D.DATE OF BIRTH:  03/19/1946   DATE OF CONSULTATION:  03/17/2007  DATE OF DISCHARGE:                                 CONSULTATION   REASON FOR CONSULTATION:  Evaluate upper GI bleed and gastric tumor.   HISTORY OF PRESENT ILLNESS:  This is a 65 year old [place] female who  has been healthy.  She was admitted on March 15, 2007, with  complaints of vomiting blood at home, dizziness and weakness.  She also  reports some upper abdominal pain and left flank pain for about three  days.  No fever or chills.  She also complains of a cough.  She has no  history of liver disease or hepatitis.  She has had decreased appetite  for about 1 month.  She has had some heartburn and burning sensation in  her stomach.  No swallowing problems.   When she came to the emergency room she was hypotensive with blood  pressure of 80/60 and responded to fluid bolus with normalization of  vital signs.  Initial hemoglobin in the emergency room was 11.6 but when  that was repeated about four hours later it was 7.6 and she required  transfusion of 2 units of packed red blood cells.  Her hemoglobin now  has been stable and she is not clinically actively bleeding.  Hemoglobin  last night, near midnight, was 11.1.   She has had a CT scan which shows a bilobed gastric mass in the mid body  of the stomach somewhere between the antrum and the body of the stomach.  There is a 3 cm intraluminal component and another 3 cm extraluminal  component.  There are no inflammatory changes.  There is no adenopathy  or other abnormalities noted.  She has had an endoscopy by Dr. 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  The intraluminal component  appeared ulcerated but it was mostly a submucosal mass.  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  The cardia and fundus of the stomach were  normal.  The antrum and pylorus were normal.  The mass was not actively  bleeding.  She has been stable since yesterday.  I am evaluating her for  consideration of surgical resection of this area.   PAST HISTORY:  Completely benign.  No known surgeries or chronic medical  illnesses.   CURRENT MEDICATIONS:  None on a regular basis that she sometimes takes  Aleve.   ALLERGIES:  None known.   FAMILY HISTORY:  Negative for GI disease.   SOCIAL HISTORY:  She lives with her daughters.  She is [place]  She  smokes two to three cigarettes per day.  No regular alcohol.  REVIEW OF SYSTEMS:  Difficult due to language barrier.  We attempted a  15-system review of systems and they were all negative except as  described above.   PHYSICAL EXAMINATION:  GENERAL:  Pleasant, alert, thin [place] woman  in no distress.  She does appear tired, a little bit lethargic but quite  oriented and understanding.  Translation was done by one daughter at the  bedside and one daughter on a cell phone.  VITAL SIGNS:  Blood pressure 133/66, pulse 69 and regular, respirations  16, temperature 98.  Oxygen saturation 98% on room air.  HEENT:  Eyes:  Sclerae clear.  Extraocular movements intact.  Ears,  mouth, throat, nose, lips, tongue and oropharynx are without gross  lesions.  NECK:  Supple, nontender.  No mass.  No adenopathy.  No jugular venous  distention.  LUNGS:  Clear to auscultation anteriorly.  No chest wall tenderness.  HEART:  Regular rate and rhythm.  No murmurs.  Radial and femoral pulses  are palpable.  ABDOMEN:  Soft.  A little bit tender in the left upper quadrant.  No  guarding.  No mass.  No distention.   No hernias.  Active bowel sounds.  EXTREMITIES:  Moves all four extremities well without pain or deformity.  NEUROLOGIC:  No gross motor or sensory deficits.   DATA:  I have reviewed the CT scan, the upper endoscopy report and  photographs and all of her lab work.  I have discussed the endoscopy  with Dr. Stan Head and I have discussed the radiology findings with  Dr. Irish Lack.   ASSESSMENT:  1. 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  2. Cough, etiology unclear.   PLAN:  1. The patient will require resection of her tumor.  I will try to      resect this with a 1-2 cm margin all the way around.  This may      require a sleeve resection but I might be able to do this as a      wedge resection as well.  2. Preop pulmonary evaluation with a chest x-ray and preop lab work.   I have discussed the indication and details of surgery with the patient  and her daughters.  Risks and complications have been outlined,  including but not limited to bleeding, infection, anastomotic leak,  thromboembolic problems, pulmonary complications, wound problems such as  infection or hernia.  They seem to understand all these issues well.  At  this time, all their questions were answered.  They are in full  agreement with this plan.      Angelia Mould. Derrell Lolling, M.D.  Electronically Signed     HMI/MEDQ  D:  03/17/2007  T:  03/17/2007  Job:  [id number]   cc:   [name], MD,FACG  [facility]  [address] [place], [place] [address]

## 2010-09-01 NOTE — Discharge Summary (Signed)
NAMEDAY, GREB             ACCOUNT NO.:  [id number]   MEDICAL RECORD NO.:  [id number]          PATIENT TYPE:  INP   LOCATION:  1531                         FACILITY:  [facility]   PHYSICIAN:  Angelia Mould. Derrell Lolling, M.D.DATE OF BIRTH:  May 21, 1945   DATE OF ADMISSION:  04/06/2007  DATE OF DISCHARGE:  04/11/2007                               DISCHARGE SUMMARY   FINAL DIAGNOSES:  1. Partial gastric outlet obstruction, improved.  2. Gastroparesis.  3. Gastric bezoar, resolved.  4. Protein calorie malnutrition, and failure to thrive.  5. 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   OPERATIONS PERFORMED:  Upper endoscopy, date April 08, 2007.   HISTORY:  This is a 65 year old [place] female who was admitted with  upper GI bleed on March 15, 2007 and was found to have a  gastrointestinal stromal tumor along the greater curvature of the  stomach.  She was operated upon on March 18, 2007, and we were able  to staple and wedge out the tumor along the greater curvature of the  stomach.  This did narrow the gastric antrum somewhat, but  postoperatively she did well, got her NG tube out, and resumed diet, and  went home.   Since her discharge, she has felt weak and has complained of early  satiety and with intermittent vomiting, although she continued to have  bowel movements.  An upper GI was performed on the date of this  admission and showed a large gastric bezoar and delayed emptying of  barium into the duodenum, but ultimately it did empty.  She had lab work  on the date of admission showing a hemoglobin of 13, white blood cell  count of 4900, BUN of 8, and creatinine 0.2, and normal amylase.   I felt the patient was becoming malnourished due to inadequate oral  intake and also at risk of dehydration, and chose to admit her to the  hospital for hydration, nutritional support, and further evaluation of  her gastric motility   PHYSICAL EXAMINATION:   GENERAL:  A thin, pleasant [place] woman who  appears to be in mild distress.  ABDOMEN:  Soft.  Mild tenderness in the left upper quadrant.  Midline  scar well healed.  Positive bowel sounds.  Not obviously distended.   HOSPITAL COURSE:  The patient was admitted, started on IV fluids, and  ultimately on hyperalimentation with a PICC line.  She was seen by Dr.  Claudette Head.  With bowel rest, she felt better and had no further  vomiting.  Dr. Russella Dar did an endoscopy on April 08, 2007, and reported  that it showed minimal retained food.  It also showed some narrowing at  the surgical site, but it was felt that this would probably open up over  time, and it was not a fixed stricture.   We put the patient back on a liquid diet, which she tolerated okay for a  couple of days.  We treated her very conservatively.   On April 11, 2007, the patient was feeling much better and was asking  to go home.  We chose to  let her go home and continue the home  hyperalimentation and just told her to drink a liquid diet as tolerated.  She was asked to return to see me in the office in about 7-10 days.      Angelia Mould. Derrell Lolling, M.D.  Electronically Signed     HMI/MEDQ  D:  06/18/2007  T:  06/19/2007  Job:  [id number]

## 2010-09-01 NOTE — Discharge Summary (Signed)
[name], HAGG             ACCOUNT NO.:  [id number]   MEDICAL RECORD NO.:  [id number]          PATIENT TYPE:  INP   LOCATION:  1435                         FACILITY:  [facility]   PHYSICIAN:  [name], M.D.  DATE OF BIRTH:  01/18/1946   DATE OF ADMISSION:  03/08/2008  DATE OF DISCHARGE:  03/11/2008                               DISCHARGE SUMMARY   REASON FOR ADMISSION:  The patient is 65 year old, [place] female  with a history of GIST status post wedge resection as well as  gastroparesis and partial gastric outlet obstruction, who presented with  several hours of nausea, emesis, and upper abdominal pain, with weakness  and dizziness.   SIGNIFICANT FINDINGS:  Temperature 97.7, blood pressure 189/53, heart  rate 91, respirations 20.  LUNGS:  Clear.  HEART:  Regular rate and rhythm without murmur, gallop, or rub.  ABDOMEN:  Shows tenderness in the epigastrium with normal bowel sounds.   LABORATORY:  Hemoglobin 11.7, WBC 2.3, platelet count 125,000, INR 1.1.  Sodium 140, potassium 4.4, chloride 112, bicarbonate 26, BUN 13,  creatinine 0.8, glucose 157.  Bilirubin 0.8, alk phos 60, AST 24, ALT  17, calcium 8.2, total protein 5.8, albumin 3.6, lipase 50.  Urinalysis  negative, CK-MB less than 1.0, troponin I less than 0.05.  CT scan of  the head:  No acute intracranial abnormality.  Acute abdominal series:  No acute abnormality pulmonary   HOSPITAL COURSE:  1. Gastric outlet obstruction.  The patient was seen by Dr. Russella Dar of      the Gastroenterology Service.  The assessment was probable partial      gastric outlet obstruction.  The patient was given IV Protonix      b.i.d.  She was made n.p.o.  The patient was treated with Reglan.      The patient was taken for EGD and had dilation of the gastric      stricture.  The patient's symptoms improved and she was discharged      in good condition.   DISCHARGE DIAGNOSIS:  1. Gastric outlet obstruction.  2. Abdominal pain.  3. Gastrointestinal stromal tumor with wedge resection.  4. Hypertension.  5. Gastroparesis.   PROCEDURES:  1. CT scan of the brain.  2. Gastric stricture dilation.  3. Panendoscopy.   DISPOSITION:  Discharged to home.   DISCHARGE MEDICATIONS:  Gleevec 1 mg daily.   FOLLOWUP:  With GI.   ACTIVITY:  As tolerated.           ______________________________  [name], M.D.     JJG/MEDQ  D:  05/01/2008  T:  05/01/2008  Job:  [id number]

## 2010-09-01 NOTE — H&P (Signed)
[name], [name]             ACCOUNT NO.:  [id number]   MEDICAL RECORD NO.:  [id number]          PATIENT TYPE:  INP   LOCATION:  1531                         [facility]ACILITY[facility]  [facility]   PHYSICIAN:  Angelia Mould. Derrell Lolling, M.D.DATE OF BIRTH:  Aug 26, 1945   DATE OF ADMISSION:  04/06/2007  DATE OF DISCHARGE:  03/26/2007                              HISTORY & PHYSICAL   CHIEF COMPLAINT:  Early satiety, vomiting.   HISTORY OF PRESENT ILLNESS:  This is a 65 year old [place] female who  was admitted with an upper GI bleed on November 26.  She was found to  have a gastrointestinal stromal tumor along the greater curvature of the  stomach.  She was taken to the operating room on March 18, 2007 and  we were able to use a GIA stapler and wedge out a tumor along the  greater curvature of the stomach.  This left a 2-3 cm lumen along the  lesser curvature with good blood supply.  Postoperatively she did well,  got her NG tube out and resumed liquid and then a soft diet and went  home.   Since discharge, she has had some problems with weakness and early  satiety and some vomiting intermittently.  She has had bowel movements  about every other day.  I saw her in the office yesterday and she really  looked like she was not feeling well.   She was sent for upper GI today and that shows large gastric bezoar and  delayed emptying of the barium into the duodenum but it ultimately did  empty into the duodenum. She also had lab work yesterday which shows a  hemoglobin of 13.0, white blood cell count of 4900, BUN of 8, creatinine  of 0.72 and otherwise completely normal complete metabolic panel.  Amylase was 73.   I am admitting her back to the hospital for rehydration, bowel rest,  prokinetic agents, and consideration of endoscopy to get the bezoar out  and to assess the gastric emptying channel.   PAST MEDICAL HISTORY:  Wedge resection of GIST tumor of stomach March 18, 2007, otherwise completely  benign.  She did have some hypertension  problems in the hospital last time.   CURRENT MEDICATIONS:  Protonix, Vicodin.   DRUG ALLERGIES:  None known.   FAMILY HISTORY:  Negative for GI disease.   SOCIAL HISTORY:  Lives with her daughter.  She is [place] She smokes  2 or 3 cigarettes per day.  No regular alcohol, does not speak [place].   REVIEW OF SYSTEMS:  Difficult due to language barrier. We attempted a 15  system review of systems and all negative except as described above.   PHYSICAL EXAM:  Thin, pleasant, [place] woman in no acute distress  although thin and a little bit feeble.  Translation is done by her  daughter.  EYES:  Sclerae clear.  Extraocular is intact.  ENT:  Ears, nose, mouth and throat and tongue are without gross lesions.  NECK:  Supple, nontender.  No mass.  No adenopathy.  LUNGS:  Clear to auscultation.  No chest wall tenderness.  HEART:  Regular rate and rhythm.  No murmurs.  Radial and femoral pulses  are palpable.  ABDOMEN:  Soft.  A little bit tender in the left upper quadrant.  Midline scar well healed.  Positive bowel sounds.  EXTREMITIES:  She moves all four extremities well without pain or  deformity.  NEUROLOGIC:  No gross motor or sensory deficits.   ASSESSMENT:  1. Gastric outlet obstruction and gastric bezoar.  Question whether      this is a motility disorder or mechanical narrowing due to the      surgery.  2. Protein calorie malnutrition and failure to thrive.   PLAN:  1. The patient will be admitted to hospital for IV fluid hydration,      initiation of hyperalimentation and bowel rest.  2. We will keep her on proton pump inhibitors.  3. I will ask [facility] to take a look at her in the next day or two      to consider endoscopy.      Angelia Mould. Derrell Lolling, M.D.  Electronically Signed     HMI/MEDQ  D:  04/06/2007  T:  04/07/2007  Job:  [id number]

## 2010-09-02 ENCOUNTER — Ambulatory Visit (HOSPITAL_COMMUNITY)
Admission: RE | Admit: 2010-09-02 | Discharge: 2010-09-02 | Disposition: A | Discharge status: Home / Self Care | Payer: Medicaid Other | Source: Ambulatory Visit | Attending: Hematology and Oncology | Admitting: Hematology and Oncology

## 2010-09-02 ENCOUNTER — Other Ambulatory Visit: Payer: Self-pay | Admitting: Hematology and Oncology

## 2010-09-02 ENCOUNTER — Encounter (HOSPITAL_COMMUNITY): Payer: Self-pay

## 2010-09-02 ENCOUNTER — Encounter (HOSPITAL_BASED_OUTPATIENT_CLINIC_OR_DEPARTMENT_OTHER): Payer: Medicaid Other | Admitting: Hematology and Oncology

## 2010-09-02 DIAGNOSIS — C494 Malignant neoplasm of connective and soft tissue of abdomen: Secondary | ICD-10-CM

## 2010-09-02 DIAGNOSIS — J986 Disorders of diaphragm: Secondary | ICD-10-CM | POA: Insufficient documentation

## 2010-09-02 DIAGNOSIS — K5730 Diverticulosis of large intestine without perforation or abscess without bleeding: Secondary | ICD-10-CM | POA: Insufficient documentation

## 2010-09-02 DIAGNOSIS — M949 Disorder of cartilage, unspecified: Secondary | ICD-10-CM | POA: Insufficient documentation

## 2010-09-02 DIAGNOSIS — K7689 Other specified diseases of liver: Secondary | ICD-10-CM | POA: Insufficient documentation

## 2010-09-02 DIAGNOSIS — M899 Disorder of bone, unspecified: Secondary | ICD-10-CM | POA: Insufficient documentation

## 2010-09-02 DIAGNOSIS — C49A0 Gastrointestinal stromal tumor, unspecified site: Secondary | ICD-10-CM

## 2010-09-02 HISTORY — DX: Malignant (primary) neoplasm, unspecified: C80.1

## 2010-09-02 LAB — CBC WITH DIFFERENTIAL/PLATELET
BASO%: 0.4 % (ref 0.0–2.0)
LYMPH%: 16.5 % (ref 14.0–49.7)
MCHC: 34.2 g/dL (ref 31.5–36.0)
MONO#: 0.4 10*3/uL (ref 0.1–0.9)
Platelets: 177 10*3/uL (ref 145–400)
RBC: 4.7 10*6/uL (ref 3.70–5.45)
WBC: 5.4 10*3/uL (ref 3.9–10.3)

## 2010-09-02 LAB — LACTATE DEHYDROGENASE: LDH: 138 U/L (ref 94–250)

## 2010-09-02 LAB — CMP (CANCER CENTER ONLY)
ALT(SGPT): 53 U/L — ABNORMAL HIGH (ref 10–47)
AST: 38 U/L (ref 11–38)
Alkaline Phosphatase: 132 U/L — ABNORMAL HIGH (ref 26–84)
CO2: 28 mEq/L (ref 18–33)
Sodium: 146 mEq/L — ABNORMAL HIGH (ref 128–145)
Total Bilirubin: 0.7 mg/dl (ref 0.20–1.60)
Total Protein: 7.5 g/dL (ref 6.4–8.1)

## 2010-09-02 MED ORDER — IOHEXOL 300 MG/ML  SOLN
100.0000 mL | Freq: Once | INTRAMUSCULAR | Status: AC | PRN
  Administered 2010-09-02: 100 mL via INTRAVENOUS

## 2010-09-04 NOTE — Op Note (Signed)
. [facility]  Patient:    [name], [name]                      MRN: [id number] Proc. Date: 09/01/00 Adm. Date:  09/01/00 Attending:  Verlin Grills, M.D. CC:         Beather Arbour. Thomasena Edis, M.D.  [name], M.D.   Operative Report  PROCEDURE:  Esophagogastroduodenoscopy with small bowel biopsies using the Pediatric colonoscope, and protocolonoscopy to the cecum.  ENDOSCOPIST:  Verlin Grills, M.D.  REFERRING PHYSICIAN:  Dr. Kirby Funk, Dr. Artist Pais.  INDICATIONS:  [name] is a 65 year old [place] female with generalized abdominal pain and weight loss.  The patient was evaluated in the [facility] emergency room on June 30, 2000, complaining of abdominal pain, bloating, vomiting, and diarrhea.  Her complete metabolic profile, CBC, urine pregnancy test, amylase, urinalysis, chest x-ray and acute abdominal x-ray series were normal.  Her July 14, 2000, CT scan of the abdomen and pelvis revealed a cyst in the right lobe of the liver, an area of thickened, dilated bronchi in the left lingular region (rule out focal bronchiectasis), and a retroverted uterus with prominent endometrial cavity associated with a 1.5 cm simple cyst in the right ovary.  Ms. Reaves has an appointment with Dr. Artist Pais for gynecologic consultation.  She is referred by Dr. Kirby Funk to undergo esophagogastroduodenoscopy, small bowel biopsies, and diagnostic colonoscopy.  The patients husband speaks [place] and acted as Copy in my office. they viewed our colonoscopy education film.  I attempted to discuss the complications associated with upper and lower GI endoscopy including intestinal bleeding and intestinal perforation.   The operative permit has been signed.  MEDICATION ALLERGIES:  None.  CURRENT MEDICATIONS:  Nexium.  PAST MEDICAL HISTORY:  No history of chronic medical illnesses, injuries or surgical  procedures.  HABITS:  Ms. Tallman does not consume alcohol.  She does smoke 1/4 a pack of cigarettes per day.  PREMEDICATIONS:  Versed 6 mg, Fentanyl 50 mcg.  ENDOSCOPE:  Olympus Pediatric colonoscope.  DESCRIPTION OF PROCEDURE:  After obtaining informed consent the patient was placed in the left lateral decubitus position.  I administered intravenous versed and intravenous Fentanyl to achieve conscious sedation for the procedure.  The patients blood pressure, oxygen saturation, and cardiac rhythm were monitored throughout the procedure and documented in the medical record.  The Olympus pediatric colonoscope was passed through the posterior hypopharynx into the proximal esophagus without difficulty.  The hypopharynx, larynx and vocal cords appeared normal.  Esophagoscopy: The proximal, mid and lower segments of the esophagus appeared completely normal.  Gastroscopy: A retroflexed view of the gastric cardia and fundus was normal. The gastric body, antrum, and pylorus appeared normal.  Duodenoscopy: The duodenal bulb, mid duodenum, distal duodenum, and proximal jejunum appeared normal.  Five biopsies were taken from the second - third portions of the duodenum to rule out villous atrophy or parasitic infection.  ASSESSMENT:  Normal esophagogastroduodenoscopy.  Small bowel biopsies pending.  PROCEDURE:  PROCTOCOLONOSCOPY TO THE CECUM:  Anal inspection was normal. Digital rectal examination was normal.  The Olympus Pediatric Video Colonoscope was introduced into the rectum and advanced to the cecum under direct visualization.  Colonic preparation for the exam today was excellent.  Rectum:  Normal.  Sigmoid colon:  Sigmoid colon and descending colon normal.  Splenic flexure:  Normal.  Transverse colon:  Normal.  Hepatic flexure:  Normal.  Ascending  colon:  Normal.  Cecum and ileocecal valve:  Normal.  ASSESSMENT:  Normal proctocolonoscopy to the cecum.  No endoscopic  evidence for the presence of colorectal neoplasia. DD:  09/01/00 TD:  09/02/00 Job: [id number]

## 2010-09-04 NOTE — Discharge Summary (Signed)
NAMEDORSEY, CHARETTE             ACCOUNT NO.:  [id number]   MEDICAL RECORD NO.:  [id number]          PATIENT TYPE:  INP   LOCATION:  1527                         FACILITY:  [facility]   PHYSICIAN:  Angelia Mould. Derrell Lolling, M.D.DATE OF BIRTH:  1945/12/03   DATE OF ADMISSION:  03/15/2007  DATE OF DISCHARGE:  03/26/2007                               DISCHARGE SUMMARY   FINAL DIAGNOSES:  1. 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  2. Upper GI (gastrointestinal) bleed secondary to #1.  3. Hyponatremia, resolved.  4. Hypertension.  5. Thrush.   OPERATIONS PERFORMED:  1. Upper endoscopy with biopsy dated 03/16/2007.  2. Laparotomy with wedge resection of gastrointestinal stromal tumor      of greater curvature of stomach date 03/18/2007.   HISTORY:  This is a generally healthy, thin 65 year old [place]  female who was admitted through the emergency room by Dr. Stan Head  because of vomiting blood at home.  She had dizziness and weakness as a  complaint.  No history of liver disease or hepatitis.  She did complain  of heartburn and discomfort in the epigastrium.  She was seen in the  emergency room by Dr. Leone Payor.  She had initial blood pressure of 80/60  and responded nicely to fluid bolus.  Initial hemoglobin was 11.6 prior  to re-equilibration.  She was admitted by Dr. Stan Head.   PHYSICAL EXAMINATION ON ADMISSION:  VITAL SIGNS:  She had a blood  pressure of 82/60 with a pulse of 112 and after IV saline was up to  102/70 with a pulse down to the 80s.  EYES: Sclerae clear.  No icterus.  LUNGS: Clear to auscultation.  HEART: Regular rate and rhythm, no murmur.  ABDOMEN: Soft, active bowel sounds.  Mild tenderness in the epigastrium  but no mass felt.   HOSPITAL COURSE:  The patient was admitted by Dr. Stan Head.  Four  hours after admission, her hemoglobin was noted to be 7.6 and she  required transfusion of two units of packed red blood  cells and she  remained fairly stable thereafter.  She had an upper endoscopy by Dr.  Stan Head which showed what appeared to be a gastrointestinal stromal  tumor in the midportion of the stomach, intraluminal component,  ulcerated.  The CT scan showed a bilobed, dumbbell-shaped gastric mass  in the mid body of the stomach with about a 3-cm intraluminal component  and another 3-cm extraluminal component but no inflammatory changes or  adenopathy.  She remained stable.  I was asked to see her on 03/17/2007  in consultation and I felt that this area would need to be resected.  This was discussed with her through an interpreter and with her daughter  who spoke [place] and she consented to this.   The patient was taken to the operating room on March 18, 2007.  She  was explored and I found that she had a bilobed mass along the greater  curvature of the stomach.  I found that I could resect this by stapling  around it and  leaving the lesser curvature of the stomach intact,  leaving a 2- to 3-cm channel.  The staple line was oversewn.   Final pathology report showed that this was a gastrointestinal stromal  tumor.  I had negative margins all the way around with the closest  margin to the proximal margin being 1.5 cm.  There was no vascular or  perineural invasion.  Two lymph nodes were incidentally examined and  they were negative.  Immunohistochemical stains include CD117 (c-kit)  and CD34 were positive.  The tumor was negative for S-100, desmin, and  smooth muscle actin.  Dr. Guerry Bruin felt that due to the tumor size,  mitotic rate, that the tumor had an intermediate risk of progression.   Postoperatively, the patient did fairly well.  She had an ileus for a  few days and required nasogastric suction for a few days.  She had some  problems transiently with hyponatremia thought to be due to  overhydration and, with fluid restriction, this resolved.  She had some  problems with  hypertension which responded nicely to beta blockers and  she was switched to oral beta blockers at a later date.  She also was  identified to have thrush and that was treated with oral Magic mouthwash  and that improved as well.   We started a clear liquid diet on 03/23/2007 following unremarkable upper  GI which showed no leak or obstruction.  Her appetite was not great and  it took a few days to get her ready to go home.  She was discharged on  03/26/2007.  At that time, she felt good and was tolerating full liquids,  keeping things down and wanted to go home and really was not distended.  Follow-up appointment was arranged with me in the office.  The wound was  looking good.      Angelia Mould. Derrell Lolling, M.D.  Electronically Signed     HMI/MEDQ  D:  04/04/2007  T:  04/04/2007  Job:  [id number]   cc:   [name], MD,FACG  [facility]  [address] [place], [place] [address]

## 2010-09-10 ENCOUNTER — Encounter (HOSPITAL_BASED_OUTPATIENT_CLINIC_OR_DEPARTMENT_OTHER): Payer: Medicaid Other | Admitting: Hematology and Oncology

## 2010-09-10 DIAGNOSIS — C494 Malignant neoplasm of connective and soft tissue of abdomen: Secondary | ICD-10-CM

## 2010-11-12 ENCOUNTER — Inpatient Hospital Stay (HOSPITAL_COMMUNITY)
Admission: EM | Admit: 2010-11-12 | Discharge: 2010-11-16 | DRG: 419 | Disposition: A | Discharge status: Home / Self Care | Payer: Medicaid Other | Attending: Obstetrics and Gynecology | Admitting: Obstetrics and Gynecology

## 2010-11-12 DIAGNOSIS — K8010 Calculus of gallbladder with chronic cholecystitis without obstruction: Secondary | ICD-10-CM | POA: Diagnosis present

## 2010-11-12 DIAGNOSIS — K8000 Calculus of gallbladder with acute cholecystitis without obstruction: Principal | ICD-10-CM | POA: Diagnosis present

## 2010-11-12 DIAGNOSIS — R7989 Other specified abnormal findings of blood chemistry: Secondary | ICD-10-CM | POA: Diagnosis present

## 2010-11-12 DIAGNOSIS — K660 Peritoneal adhesions (postprocedural) (postinfection): Secondary | ICD-10-CM | POA: Diagnosis present

## 2010-11-12 DIAGNOSIS — Z903 Acquired absence of stomach [part of]: Secondary | ICD-10-CM

## 2010-11-12 DIAGNOSIS — I10 Essential (primary) hypertension: Secondary | ICD-10-CM | POA: Diagnosis present

## 2010-11-12 DIAGNOSIS — Z8500 Personal history of malignant neoplasm of unspecified digestive organ: Secondary | ICD-10-CM

## 2010-11-13 ENCOUNTER — Encounter (HOSPITAL_COMMUNITY): Payer: Self-pay

## 2010-11-13 ENCOUNTER — Emergency Department (HOSPITAL_COMMUNITY): Payer: Medicaid Other

## 2010-11-13 ENCOUNTER — Inpatient Hospital Stay (HOSPITAL_COMMUNITY): Payer: Medicaid Other

## 2010-11-13 DIAGNOSIS — R740 Nonspecific elevation of levels of transaminase and lactic acid dehydrogenase [LDH]: Secondary | ICD-10-CM

## 2010-11-13 DIAGNOSIS — R7402 Elevation of levels of lactic acid dehydrogenase (LDH): Secondary | ICD-10-CM

## 2010-11-13 DIAGNOSIS — K8020 Calculus of gallbladder without cholecystitis without obstruction: Secondary | ICD-10-CM

## 2010-11-13 DIAGNOSIS — R109 Unspecified abdominal pain: Secondary | ICD-10-CM

## 2010-11-13 DIAGNOSIS — K859 Acute pancreatitis without necrosis or infection, unspecified: Secondary | ICD-10-CM

## 2010-11-13 LAB — URINALYSIS, ROUTINE W REFLEX MICROSCOPIC
Glucose, UA: NEGATIVE mg/dL
Hgb urine dipstick: NEGATIVE
Nitrite: NEGATIVE
Protein, ur: NEGATIVE mg/dL
Urobilinogen, UA: 0.2 mg/dL (ref 0.0–1.0)
pH: 7.5 (ref 5.0–8.0)

## 2010-11-13 LAB — DIFFERENTIAL
Basophils Absolute: 0 10*3/uL (ref 0.0–0.1)
Basophils Relative: 0 % (ref 0–1)
Eosinophils Absolute: 0.1 10*3/uL (ref 0.0–0.7)
Eosinophils Relative: 2 % (ref 0–5)
Monocytes Absolute: 0.5 10*3/uL (ref 0.1–1.0)
Monocytes Relative: 8 % (ref 3–12)

## 2010-11-13 LAB — COMPREHENSIVE METABOLIC PANEL
ALT: 61 U/L — ABNORMAL HIGH (ref 0–35)
Albumin: 3.6 g/dL (ref 3.5–5.2)
Calcium: 9.3 mg/dL (ref 8.4–10.5)
GFR calc Af Amer: >60 mL/min (ref >60)
Glucose, Bld: 126 mg/dL — ABNORMAL HIGH (ref 70–99)
Sodium: 138 mEq/L (ref 135–145)
Total Protein: 7.1 g/dL (ref 6.0–8.3)

## 2010-11-13 LAB — CBC
Hemoglobin: 13.4 g/dL (ref 12.0–15.0)
MCH: 29.5 pg (ref 26.0–34.0)
MCHC: 35 g/dL (ref 30.0–36.0)
RDW: 12.4 % (ref 11.5–15.5)

## 2010-11-13 LAB — URINE MICROSCOPIC-ADD ON

## 2010-11-13 LAB — LIPASE, BLOOD: Lipase: 77 U/L — ABNORMAL HIGH (ref 11–59)

## 2010-11-13 MED ORDER — IOHEXOL 300 MG/ML  SOLN
80.0000 mL | Freq: Once | INTRAMUSCULAR | Status: AC | PRN
  Administered 2010-11-13: 80 mL via INTRAVENOUS

## 2010-11-14 ENCOUNTER — Inpatient Hospital Stay (HOSPITAL_COMMUNITY): Payer: Medicaid Other

## 2010-11-14 ENCOUNTER — Other Ambulatory Visit (INDEPENDENT_AMBULATORY_CARE_PROVIDER_SITE_OTHER): Payer: Self-pay | Admitting: General Surgery

## 2010-11-14 DIAGNOSIS — K811 Chronic cholecystitis: Secondary | ICD-10-CM

## 2010-11-14 LAB — COMPREHENSIVE METABOLIC PANEL
ALT: 47 U/L — ABNORMAL HIGH (ref 0–35)
Albumin: 3.1 g/dL — ABNORMAL LOW (ref 3.5–5.2)
Alkaline Phosphatase: 106 U/L (ref 39–117)
Chloride: 110 mEq/L (ref 96–112)
Glucose, Bld: 130 mg/dL — ABNORMAL HIGH (ref 70–99)
Potassium: 3.2 mEq/L — ABNORMAL LOW (ref 3.5–5.1)
Sodium: 142 mEq/L (ref 135–145)
Total Protein: 6.2 g/dL (ref 6.0–8.3)

## 2010-11-14 LAB — URINE CULTURE
Colony Count: NO GROWTH
Culture  Setup Time: 201207270456

## 2010-11-14 LAB — LIPASE, BLOOD: Lipase: 48 U/L (ref 11–59)

## 2010-11-15 NOTE — Op Note (Signed)
NAMETAMIRRA, SIENKIEWICZ NO.:  [id number]  MEDICAL RECORD NO.:  [id number]  LOCATION:  1332                         FACILITY:  [facility]  PHYSICIAN:  Angelia Mould. Derrell Lolling, M.D.DATE OF BIRTH:  July 08, 1945  DATE OF PROCEDURE:  11/14/2010 DATE OF DISCHARGE:                              OPERATIVE REPORT   PREOPERATIVE DIAGNOSIS:  Acute and chronic cholecystitis with cholelithiasis.  POSTOPERATIVE DIAGNOSIS:  Acute and chronic cholecystitis with cholelithiasis.  OPERATION PERFORMED:  Laparoscopic lysis of adhesions, laparoscopic cholecystectomy with intraoperative cholangiogram.  SURGEON:  Angelia Mould. Derrell Lolling, M.D.  FIRST ASSISTANT:  Ardeth Sportsman, MD  OPERATIVE INDICATIONS:  This is a 65 year old [place] woman who has a past history of a partial gastrectomy for GIST tumor several years ago. There has been no known recurrence today.  She has chronic epigastric pain and nausea.   She was admitted to this hospital more than 24 hours ago with epigastric pain but was found to have elevated liver function tests, marginally elevated lipase, and a gallbladder ultrasound which showed gallstones.  She also had a CT scan which showed no sign of any tumor. This morning, her liver function tests have essentially normalized and her lipase has normalized.  I advised her and her son that I thought that at least some of her abdominal pain symptoms were due to her gallbladder and that she may have passed a common bile duct stone.  I offered to proceed with cholecystectomy today and they very much wanted to do that.  OPERATIVE FINDINGS:  The gallbladder was chronically inflamed.  It was slightly thick-walled, discolored with extensive adhesions to it, suggesting multiple prior episodes.  The anatomy of the cystic duct, cystic artery and common bile duct were normal.  Common bile duct was slightly dilated on cholangiogram but the cholangiogram otherwise looked normal.  We could see  intrahepatic and extrahepatic bile ducts.  The contrast flowed into the duodenum and there was no filling defect. There was some contrast in the colon from the CAT scan with slightly obscured visualization of the distal common bile duct.  There were adhesions of the stomach to the anterior midline and also adhesions of the omentum to the anterior midline.  The liver looked healthy.  There was no sign of any tumor.  OPERATIVE TECHNIQUE:  Following induction of general endotracheal anesthesia, the patient's abdomen was prepped and draped in a sterile fashion.  Intravenous antibiotics were given.  The patient was identified as correct patient and correct procedure.  0.5% Marcaine with epinephrine was used as a local infiltration anesthetic.  5-mm optical trocar was placed in the right upper quadrant.  This was uneventful.  There were no adhesions in the right upper quadrant. Pneumoperitoneum was created.  Video cam was inserted with visualization and  findings as described above.  I placed another 5-mm trocar in the right upper quadrant and used this to take down some of the adhesions around the falciform ligament and some of the adhesions around the umbilicus. I then put a 5-mm trocar in subxiphoid region and an 11-mm trocar in the umbilicus, directing it to the right laterally.  These were all done under direct vision and this was atraumatic.  We lifted the fundus of the gallbladder.  We took all the adhesions down off the gallbladder.  We were able to then identify the infundibulum and retract it and then take all the adhesions down there.  We incised the peritoneum on the right and the left of the infundibulum.  We isolated the cystic duct and the cystic artery.  The cystic artery was isolated. As it went onto the wall of the gallbladder, secured with metal clips and divided.  We created a window behind the cystic duct.  We got a little bit of bleeding behind the cystic duct and on  looking to the right, found that there was a posterior branch of the cystic artery and we isolated it and secured it with metal clips and divided it.  That stopped all the bleeding and everything was clean.  Cholangiogram catheter was inserted into the cystic duct and a cholangiogram was obtained using the C-arm.  The cholangiogram was essentially normal as described above.  The cholangiogram catheter was removed, the cystic ducts secured with multiple metal clips and divided.  The gallbladder was dissected from its bed with electrocautery, placed in the specimen bag and removed.  The subphrenic space and the operative field and subhepatic space were copiously irrigated with saline.  Hemostasis was excellent.  The irrigation fluid was completely clear.  The trocars were removed under direct vision.  There was no bleeding from trocar sites. The fascia at the umbilicus was closed with a figure-of-eight suture of 0 Vicryl.  The skin incisions were closed with subcuticular sutures of 4- 0 Monocryl and Dermabond.  The patient tolerated the procedure well and was taken to recovery room in stable condition.  Estimated blood loss was probably about 20 mL or less.  Complications were none.  Sponge, needle and instrument counts were correct.     Angelia Mould. Derrell Lolling, M.D.     HMI/MEDQ  D:  11/14/2010  T:  11/14/2010  Job:  [id number]  cc:   Venita Lick. Russella Dar, MD, FACG [address] [place] [address]  Electronically Signed by Claud Kelp M.D. on 11/15/2010 11:42:12 AM

## 2010-11-16 LAB — BASIC METABOLIC PANEL
BUN: 9 mg/dL (ref 6–23)
CO2: 27 mEq/L (ref 19–32)
Calcium: 9.5 mg/dL (ref 8.4–10.5)
Chloride: 104 mEq/L (ref 96–112)
Creatinine, Ser: 0.62 mg/dL (ref 0.50–1.10)
GFR calc Af Amer: >60 mL/min (ref >60)
GFR calc non Af Amer: >60 mL/min (ref >60)
Glucose, Bld: 120 mg/dL — ABNORMAL HIGH (ref 70–99)
Potassium: 3.6 mEq/L (ref 3.5–5.1)
Sodium: 140 mEq/L (ref 135–145)

## 2010-11-17 NOTE — Consult Note (Signed)
[name], [name]             ACCOUNT NO.:  [id number]  MEDICAL RECORD NO.:  [id number]  LOCATION:  1332                         FACILITY:  [facility]  PHYSICIAN:  Ollen Gross. Vernell Morgans, M.D. DATE OF BIRTH:  [birth year]  DATE OF CONSULTATION:  11/13/2010 DATE OF DISCHARGE:                                CONSULTATION   REQUESTING PHYSICIAN:  Malcolm T. Russella Dar, MD, Clementeen Graham.  PRIMARY CARE PHYSICIAN:  [name], M.D.  CONSULTING SURGEON:  Ollen Gross. Vernell Morgans, M.D.  REASON FOR CONSULTATION:  Biliary colic.  HISTORY OF PRESENT ILLNESS:  [name] is a 65 year old [place] female with a history of GIST tumor status post resection as well as gastric outlet obstruction status post dilatation with hypertension and arthritis, who has had intermittent episodic epigastric pains for approximately 4 years.  She has had several workups in the past including ultrasound which did not show any gallstones.  She then underwent endoscopy which did reveal stenosis at the body and antrum after her GIST removal.  She has received several dilatations via endoscopy by the gastroenterologist.  Her most recent endoscopy was in January 2012, which showed very mild amount of stenosis once again at the body and the antrum of the stomach.  Yesterday, the patient was at home and began having very similar symptoms.  She states her pain was in the epigastrium.  She admits to nausea and emesis x6.  She certainly no longer has any nausea or vomiting but continues to complain of epigastric abdominal pain.  She presented to the emergency department where she had further workup which included ultrasound of the abdomen revealing gallbladder sludge and gallbladder stones without evidence for cholecystitis.  She had a dilated proximal common bile duct up to 8 mm.  She also had CT scan of her abdomen and pelvis to rule out any further issues from her GIST tumor.  This revealed a mild common duct dilatation up to 7 mm.   There was no obstructing lesion seen on CT scan.  Her gallbladder stones could not be demonstrated on CT scan as they could on ultrasound.  She also had some elevated LFTs.  Her alkaline phosphatase was 126, AST 111, ALT 61.  Her total bilirubin was normal as well as her white blood cell count.  She did have a lipase that was slightly elevated at 77.  We have been ultimately asked to evaluate the patient in case she needs laparoscopic cholecystectomy.  REVIEW OF SYSTEMS:  Please see HPI.  Otherwise, all other systems have been reviewed and are negative.  FAMILY HISTORY:  Noncontributory.  PAST MEDICAL HISTORY: 1. GIST tumor status post resection. 2. Hypertension. 3. Osteoarthritis.  PAST SURGICAL HISTORY:  Laparotomy with wedge resection secondary to GIST tumor.  SOCIAL HISTORY:  The patient lives at home with her family.  She is from [place] and moved to [place] in 1994.  She denies any alcohol, tobacco or illicit drug abuse.  ALLERGIES:  NKDA.  MEDICATIONS AT HOME:  Include: 1. Norvasc 10 mg daily. 2. Omeprazole 20 mg daily.  PHYSICAL EXAMINATION:  GENERAL:  [name] is a pleasant well-developed, well-nourished [place] female who is currently in bed in no  acute distress. VITAL SIGNS:  Temperature 98, pulse 64, blood pressure 135/73, respirations 22. HEENT:  Head is normocephalic, atraumatic.  Sclerae noninjected.  Pupils are equal, round and reactive to light.  Ears and nose without any obvious masses or lesions.  No rhinorrhea.  Mouth is pink.  Throat shows no exudate. HEART:  Regular rate and rhythm.  Normal S1, S2.  No murmurs, gallops or rubs are noted.  She does have palpable carotid, radial and pedal pulses bilaterally. LUNGS:  Clear to auscultation bilaterally with no wheezes, rhonchi or rubs noted.  Respiratory effort is nonlabored. ABDOMEN:  Soft and very tender in the epigastric region.  She does have mild right upper quadrant tenderness but no  Murphy sign.  She does have active bowel sounds and is otherwise nondistended.  She does have upper midline scar present but no masses, hernias or organomegaly are noted. MUSCULOSKELETAL:  All 4 extremities are symmetrical with no cyanosis, clubbing or edema. SKIN:  Warm and dry with no masses, lesions or rashes. PSYCHIATRIC:  The patient is alert and oriented x3 with an appropriate affect.  LABORATORY DATA:  Lipase 77.  Sodium 138, potassium 3.7, glucose 126, BUN 21, creatinine 0.8.  Total bilirubin 0.4, alkaline phosphatase 126, AST 111, ALT 61.  White blood cell count 6000, hemoglobin 13.4, hematocrit 38.3, platelet count is 166.  DIAGNOSTICS:  CT scan of the abdomen and pelvis revealed mild common bile duct dilatation up to 7 mm.  No gallstones are evident on CT scan and no other obstructing lesions are identified.  Ultrasound of the abdomen and pelvis revealed gallbladder sludge and gallstones with no evidence of cholecystitis.  The common bile duct measures approximately 8 mm.  IMPRESSION: 1. Biliary pancreatitis with cholelithiasis. 2. History of gastrointestinal stromal tumor with postoperative     gastric outlet obstructions requiring endoscopic dilatation. 3. Hypertension.  PLAN:  Currently, given the patient's history of similar symptoms as well as pain the past without findings of gallstones on ultrasound and ultimate findings of gastric outlet obstruction with endoscopy, we cannot be sure at this time that her pain is definitely related to gallstones or biliary colic.  It is quite likely though given her symptoms that this is possible.  We discussed this patient with the gastroenterologist.  What we will plan on doing is repeating her LFTs as well as the lipase in the morning.  If these are trending upwards, then we would recommend proceeding with an endoscopy versus ERCP.  If they begin trending down, then we will likely proceed with a laparoscopic cholecystectomy.   We will follow the patient along with you.  Thank you for your consultation.     Letha Cape, PA   ______________________________ Ollen Gross. Vernell Morgans, M.D.    KEO/MEDQ  D:  11/13/2010  T:  11/13/2010  Job:  [id number]  cc:   Venita Lick. Russella Dar, MD, FACG [address] [place] [address]  [name], M.D. Fax: [phone]  Electronically Signed by Barnetta Chapel PA on 11/16/2010 11:05:55 AM Electronically Signed by Chevis Pretty III M.D. on 11/17/2010 03:50:51 AM

## 2010-11-19 ENCOUNTER — Telehealth (INDEPENDENT_AMBULATORY_CARE_PROVIDER_SITE_OTHER): Payer: Self-pay

## 2010-11-19 LAB — CULTURE, BLOOD (ROUTINE X 2)
Culture  Setup Time: 201207270843
Culture: NO GROWTH

## 2010-11-19 NOTE — Telephone Encounter (Signed)
Lmom for pt to call for po appt. Need to f/u in office in 3-4 weeks.

## 2010-11-23 NOTE — Discharge Summary (Signed)
  [name], [name] NO.:  [id number]  MEDICAL RECORD NO.:  [id number]  LOCATION:  1332                         FACILITY:  [facility]  PHYSICIAN:  [name], M.D.  DATE OF BIRTH:  September 29, 1945  DATE OF ADMISSION:  11/12/2010 DATE OF DISCHARGE:  11/16/2010                              DISCHARGE SUMMARY   REASON FOR ADMISSION:  A 65 year old [place] female GIFT tumor, who presented with 2 days of epigastric pain and mildly elevated liver function tests.  SIGNIFICANT FINDINGS:  VITAL SIGNS:  Blood pressure 134/72/, heart rate 26, respirations 20, temperature 98.4. LUNGS:  Clear. HEART:  Regular rate and rhythm without murmur, gallops, or rub. ABDOMEN:  Tender in the epigastric area.  Normal bowel sounds.  No mass. EXTREMITIES:  No edema.  LABORATORY DATA:  Sodium 138, potassium 3.7, glucose 126, creatinine 0.8, SGOT 111, SGPT 61, alk phosphorus 126, lipase 77.  Ultrasound showed stone without definite evidence of cholecystitis, proximal, bile duct is 8 mm.  HOSPITAL COURSE:  The patient was admitted with abdominal pain.  A CT scan of the abdomen showed mild common bile duct dilatation of 7 mm but no obstruction identified.  The patient was seen by GI and general surgery was consulted.  On January 28, the patient was taken for a laparoscopic cholecystectomy with intraoperative cholangiogram.  The cholangiogram was negative for any stone.  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  The patient tolerated the procedure well.  She did have some postoperative nausea which resolved after 24 hours.  Her hypertension remained under good control during the hospitalization.  Followup liver function tests on July 28 were normalizing.  DISCHARGE DIAGNOSES: 1. Cholelithiasis. 2. Chronic cholecystitis. 3. Hypertension. 4. GIFT tumor.  PROCEDURES: 1. CT scan of the abdomen. 2. Ultrasound of the abdomen. 3. Laparoscopic cholecystectomy with  intraoperative cholangiogram.  DISCHARGE MEDICATIONS: 1. Amlodipine 10 mg 1 p.o. daily. 2. Omeprazole 20 mg 1 p.o. daily. 3. Vicodin 5/500 1 or 2 q.4h. p.r.n. for pain.  DISPOSITION:  Discharged to home.  FOLLOW UP:  As per general surgery.  DIET:  Low sodium.  ACTIVITY:  As tolerated.  CODE STATUS:  Full code.          ______________________________ [name], M.D.     JJG/MEDQ  D:  11/16/2010  T:  11/16/2010  Job:  [id number]  cc:   Angelia Mould. Derrell Lolling, M.D. [address], [address] [place] [address]  Electronically Signed by Kirby Funk M.D. on 11/23/2010 01:59:26 PM

## 2010-12-08 NOTE — H&P (Signed)
NAMELENELL, MCCONNELL NO.:  [id number]  MEDICAL RECORD NO.:  [id number]  LOCATION:  WLED                         FACILITY:  [facility]  PHYSICIAN:  Houston Siren, MD           DATE OF BIRTH:  [birth year]  DATE OF ADMISSION:  11/12/2010 DATE OF DISCHARGE:                             HISTORY & PHYSICAL   REASON FOR ADMISSION:  Abdominal pain.  ADVANCE DIRECTIVE:  Full code.  HISTORY OF PRESENT ILLNESS:  This is a 65 year old [place] female with history of GIST (gastrointestinal stromal cell tumor), diagnosed about 4 years ago, status post resection; GERD; hypertension; and osteoarthritis; presents to the emergency room at [facility] with epigastric pain for 2 days.  Family said that last week she was doing well without any abdominal pain.  She has no fever or chills, but some nausea.  She denied any black stool or bloody stool.  She does take occasional Aleve in significant amount.  For her GIST, she had beenfollowed by Dr. Chip Boer and had negative abdominal CT about 2 months ago. Workup in the emergency room shows that her liver function tests slightly elevated with SGOT of 111, SGPT of 61, and alkaline phosphatase of 126.  An abdominal ultrasound shows stones and sludge, but the common bile duct is only slightly dilated at 8 mm.  She has normal creatinine and her lipase is slightly elevated at 77.  Her white count is 6000 and hemoglobin is 13.4.  Her urinalysis is negative.  Hospitalist was asked to admit the patient because of gallstone, biliary pain.  PAST MEDICAL HISTORY:  As above.  SOCIAL HISTORY:  She came from [place] in the 1990s.  She lives with her family.  Denies alcohol, drug, or tobacco use.  ALLERGIES:  No known drug allergies.  CURRENT MEDICATIONS: 1. Azor 10 mg once a day. 2. Omeprazole 20 mg t.i.d.  FAMILY HISTORY:  Noncontributory.  PHYSICAL EXAMINATION:  VITAL SIGNS:  Blood pressure 134/73, pulse of 76, respiratory rate of 20, temperature  98.4. GENERAL:  She is alert and oriented, but she appeared to be exhausted. HEENT:  Sclerae nonicteric.  Speech is fluent.  Tongue is midline. CARDIAC:  S1 and S2, regular. LUNGS:  Clear. ABDOMEN:  Tender in the epigastric area and little bit over the right upper quadrant area.  Surgical wound is well healed.  Bowel sounds present.  Difficult to palpate mass since she is in so much pain.  She does have a little bit of left flank pain as well. EXTREMITIES:  No edema.  She has good pulses bilaterally. SKIN:  Warm and dry.  No evidence of shock. PSYCHIATRIC AND NEUROLOGIC:  Unremarkable as well.  LABORATORY STUDIES:  Serum sodium 138, potassium 3.7, glucose 126, creatinine 0.8.  SGOT 111, SGPT 61, alkaline phosphatase 126, lipase 77. Ultrasound shows sludge and stone without definite evidence for cholecystitis, proximal common bile duct is 8 mm, the distal duct is obscured by bowel gas artifact.  She has no hydronephrosis.  White count of 6000, hemoglobin of 13.4, platelet count of 166,000.  Urinalysis is negative.  IMPRESSION:  This is a 65 year old [place] female who presented with epigastric pain with  elevated alkaline phosphatase, liver function tests, and ultrasound shows stones and sludge, it is suspicious for gallstones stuck in the common bile duct (choledocholithiasis).  Having said that, I am also concerned about recurrence of her small bowel tumor and this must be excluded, therefore I will get a repeat abdominal pelvic CT and if negative, then ERCP with stone removal will make sense. For now, we will place on n.p.o., give her intravenous fluids, and IV Dilaudid.  We will admit her to Dr. Amedeo Kinsman service and as per prior arrangement, he will resume her care.  We will continue her Azor. Please consult GI in the morning.  She is stable.     Houston Siren, MD     PL/MEDQ  D:  11/13/2010  T:  11/13/2010  Job:  [id number]  Electronically Signed by Houston Siren  on  12/08/2010 12:49:44 AM

## 2011-01-06 LAB — CULTURE, BLOOD (ROUTINE X 2)

## 2011-01-06 LAB — CBC
HCT: 27.3 — ABNORMAL LOW
Hemoglobin: 11.3 — ABNORMAL LOW
Hemoglobin: 8.8 — ABNORMAL LOW
MCHC: 33.9
MCHC: 34.4
MCHC: 34.6
MCV: 86.7
MCV: 87.1
MCV: 87.2
MCV: 88.4
Platelets: 104 — ABNORMAL LOW
Platelets: 131 — ABNORMAL LOW
Platelets: 141 — ABNORMAL LOW
RBC: 2.94 — ABNORMAL LOW
RBC: 3.78 — ABNORMAL LOW
RBC: 3.8 — ABNORMAL LOW
RDW: 13.9
RDW: 14
RDW: 14.1
WBC: 1.9 — ABNORMAL LOW
WBC: 3.3 — ABNORMAL LOW

## 2011-01-06 LAB — URINALYSIS, ROUTINE W REFLEX MICROSCOPIC
Bilirubin Urine: NEGATIVE
Glucose, UA: NEGATIVE
Ketones, ur: NEGATIVE
Nitrite: NEGATIVE
Protein, ur: NEGATIVE
pH: 5.5

## 2011-01-06 LAB — HEPATIC FUNCTION PANEL
Albumin: 2.9 — ABNORMAL LOW
Alkaline Phosphatase: 154 — ABNORMAL HIGH
Bilirubin, Direct: 0.3
Total Bilirubin: 1.4 — ABNORMAL HIGH

## 2011-01-06 LAB — DIFFERENTIAL
Basophils Absolute: 0
Basophils Relative: 0
Eosinophils Absolute: 0
Eosinophils Absolute: 0.1
Eosinophils Relative: 1
Eosinophils Relative: 5
Lymphocytes Relative: 19
Lymphs Abs: 0.5 — ABNORMAL LOW
Monocytes Absolute: 0 — ABNORMAL LOW
Monocytes Absolute: 0.3
Monocytes Absolute: 0.3
Monocytes Relative: 10
Neutrophils Relative %: 69

## 2011-01-06 LAB — PROTIME-INR: Prothrombin Time: 13.4

## 2011-01-06 LAB — COMPREHENSIVE METABOLIC PANEL
ALT: 125 — ABNORMAL HIGH
AST: 153 — ABNORMAL HIGH
AST: 25
AST: 41 — ABNORMAL HIGH
Albumin: 2.7 — ABNORMAL LOW
Albumin: 3 — ABNORMAL LOW
Albumin: 3.3 — ABNORMAL LOW
Alkaline Phosphatase: 148 — ABNORMAL HIGH
Alkaline Phosphatase: 210 — ABNORMAL HIGH
BUN: 12
BUN: 5 — ABNORMAL LOW
CO2: 23
CO2: 24
Calcium: 7.7 — ABNORMAL LOW
Calcium: 8.5
Chloride: 107
Chloride: 113 — ABNORMAL HIGH
Creatinine, Ser: 0.7
Creatinine, Ser: 0.87
GFR calc Af Amer: >60
GFR calc Af Amer: >60
GFR calc Af Amer: >60
GFR calc non Af Amer: >60
GFR calc non Af Amer: >60
GFR calc non Af Amer: >60
Glucose, Bld: 116 — ABNORMAL HIGH
Potassium: 3.8
Potassium: 4.2
Sodium: 139
Sodium: 140
Sodium: 140
Total Bilirubin: 1.5 — ABNORMAL HIGH
Total Protein: 4.9 — ABNORMAL LOW
Total Protein: 5.3 — ABNORMAL LOW
Total Protein: 6

## 2011-01-06 LAB — CATH TIP CULTURE: Culture: NO GROWTH

## 2011-01-06 LAB — URINE CULTURE

## 2011-01-06 LAB — TRIGLYCERIDES: Triglycerides: 125

## 2011-01-06 LAB — CHOLESTEROL, TOTAL: Cholesterol: 161

## 2011-01-06 LAB — POCT PREGNANCY, URINE
Operator id: 22985
Preg Test, Ur: NEGATIVE

## 2011-01-06 LAB — LIPASE, BLOOD: Lipase: 39

## 2011-01-06 LAB — MAGNESIUM: Magnesium: 1.9

## 2011-01-07 LAB — BASIC METABOLIC PANEL
CO2: 23
CO2: 26
Calcium: 9.1
Chloride: 108
Creatinine, Ser: 0.61
GFR calc Af Amer: >60
GFR calc Af Amer: >60
GFR calc Af Amer: >60
GFR calc non Af Amer: >60
GFR calc non Af Amer: >60
Potassium: 4.2
Potassium: 4.5
Sodium: 139
Sodium: 140

## 2011-01-07 LAB — COMPREHENSIVE METABOLIC PANEL
ALT: 81 — ABNORMAL HIGH
AST: 56 — ABNORMAL HIGH
Albumin: 2.8 — ABNORMAL LOW
Alkaline Phosphatase: 133 — ABNORMAL HIGH
Alkaline Phosphatase: 149 — ABNORMAL HIGH
BUN: 14
CO2: 26
GFR calc non Af Amer: >60
Glucose, Bld: 136 — ABNORMAL HIGH
Potassium: 3.9
Potassium: 4.3
Sodium: 138
Total Protein: 5.4 — ABNORMAL LOW

## 2011-01-07 LAB — DIFFERENTIAL
Basophils Relative: 0
Eosinophils Absolute: 0.1
Eosinophils Relative: 3
Monocytes Relative: 11
Neutrophils Relative %: 74

## 2011-01-07 LAB — CBC
Hemoglobin: 10.6 — ABNORMAL LOW
RBC: 3.54 — ABNORMAL LOW
WBC: 4.5

## 2011-01-07 LAB — PHOSPHORUS
Phosphorus: 3.2
Phosphorus: 3.5

## 2011-01-07 LAB — PREALBUMIN
Prealbumin: 19
Prealbumin: 29.3

## 2011-01-19 LAB — COMPREHENSIVE METABOLIC PANEL
BUN: 13
CO2: 26
Calcium: 8.2 — ABNORMAL LOW
Chloride: 112
Creatinine, Ser: 0.89
GFR calc Af Amer: >60
GFR calc non Af Amer: >60
Glucose, Bld: 167 — ABNORMAL HIGH
Total Bilirubin: 0.8

## 2011-01-19 LAB — POCT I-STAT, CHEM 8
BUN: 13
Creatinine, Ser: 1.1
Hemoglobin: 11.9 — ABNORMAL LOW
Potassium: 4.4
Sodium: 142
TCO2: 23

## 2011-01-19 LAB — CROSSMATCH

## 2011-01-19 LAB — BASIC METABOLIC PANEL
BUN: 9
CO2: 26
Chloride: 113 — ABNORMAL HIGH
Chloride: 116 — ABNORMAL HIGH
Creatinine, Ser: 0.81
Creatinine, Ser: 0.84
GFR calc Af Amer: >60
GFR calc Af Amer: >60
GFR calc non Af Amer: >60
Glucose, Bld: 106 — ABNORMAL HIGH
Glucose, Bld: 91
Potassium: 3.5
Potassium: 3.8
Sodium: 143

## 2011-01-19 LAB — CBC
HCT: 31.9 — ABNORMAL LOW
HCT: 34.2 — ABNORMAL LOW
Hemoglobin: 10.8 — ABNORMAL LOW
Hemoglobin: 11.7 — ABNORMAL LOW
MCHC: 33.8
MCHC: 34.1
MCV: 95.3
MCV: 96.1
RBC: 3.32 — ABNORMAL LOW
RBC: 3.59 — ABNORMAL LOW
RDW: 14.1
WBC: 2.3 — ABNORMAL LOW

## 2011-01-19 LAB — DIFFERENTIAL
Basophils Absolute: 0
Basophils Relative: 0
Eosinophils Relative: 3
Eosinophils Relative: 3
Lymphocytes Relative: 25
Lymphs Abs: 0.6 — ABNORMAL LOW
Monocytes Absolute: 0.4
Monocytes Relative: 9
Neutro Abs: 1.5 — ABNORMAL LOW
Neutro Abs: 3
Neutrophils Relative %: 66

## 2011-01-19 LAB — URINALYSIS, ROUTINE W REFLEX MICROSCOPIC
Glucose, UA: NEGATIVE
Ketones, ur: NEGATIVE
Nitrite: NEGATIVE
Protein, ur: NEGATIVE
pH: 7.5

## 2011-01-19 LAB — POCT CARDIAC MARKERS
CKMB, poc: <1 — ABNORMAL LOW
Myoglobin, poc: 39

## 2011-01-19 LAB — ABO/RH: ABO/RH(D): B POS

## 2011-01-19 LAB — URINE CULTURE: Culture: NO GROWTH

## 2011-01-19 LAB — CULTURE, BLOOD (ROUTINE X 2): Culture: NO GROWTH

## 2011-01-19 LAB — LIPASE, BLOOD: Lipase: 50

## 2011-01-19 LAB — AMYLASE: Amylase: 97

## 2011-01-22 LAB — BASIC METABOLIC PANEL
BUN: 14
Calcium: 8.9
Chloride: 107
Creatinine, Ser: 0.63
GFR calc Af Amer: >60
GFR calc non Af Amer: >60

## 2011-01-22 LAB — COMPREHENSIVE METABOLIC PANEL
AST: 25
Albumin: 3 — ABNORMAL LOW
Albumin: 3.2 — ABNORMAL LOW
Alkaline Phosphatase: 62
Alkaline Phosphatase: 63
Alkaline Phosphatase: 71
BUN: 2 — ABNORMAL LOW
BUN: 24 — ABNORMAL HIGH
BUN: 6
CO2: 27
Chloride: 110
Chloride: 112
Creatinine, Ser: 0.67
GFR calc Af Amer: >60
Glucose, Bld: 101 — ABNORMAL HIGH
Glucose, Bld: 133 — ABNORMAL HIGH
Potassium: 3.9
Potassium: 4.3
Potassium: 4.4
Total Bilirubin: 0.8
Total Bilirubin: 1.1
Total Protein: 5.1 — ABNORMAL LOW
Total Protein: 5.7 — ABNORMAL LOW

## 2011-01-22 LAB — CBC
HCT: 31.1 — ABNORMAL LOW
Hemoglobin: 10.9 — ABNORMAL LOW
MCV: 87.5
Platelets: 131 — ABNORMAL LOW
Platelets: 187
RBC: 3.55 — ABNORMAL LOW
RDW: 13.4
WBC: 3.2 — ABNORMAL LOW
WBC: 3.8 — ABNORMAL LOW

## 2011-01-22 LAB — DIFFERENTIAL
Basophils Absolute: 0
Basophils Absolute: 0
Basophils Relative: 0
Basophils Relative: 1
Lymphocytes Relative: 20
Lymphocytes Relative: 27
Monocytes Absolute: 0.2
Monocytes Absolute: 0.3
Monocytes Relative: 9
Neutro Abs: 1.9
Neutro Abs: 2.1
Neutro Abs: 2.5
Neutrophils Relative %: 65

## 2011-01-22 LAB — TRIGLYCERIDES
Triglycerides: 121
Triglycerides: 144

## 2011-01-22 LAB — CHOLESTEROL, TOTAL
Cholesterol: 168
Cholesterol: 184

## 2011-01-22 LAB — PHOSPHORUS
Phosphorus: 3.4
Phosphorus: 3.5
Phosphorus: 4.3

## 2011-01-22 LAB — MAGNESIUM: Magnesium: 1.9

## 2011-01-25 LAB — BASIC METABOLIC PANEL
BUN: 13
BUN: 7
CO2: 21
CO2: 24
Calcium: 8.4
Chloride: 91 — ABNORMAL LOW
Chloride: 96
Creatinine, Ser: 0.59
GFR calc Af Amer: >60
GFR calc non Af Amer: >60
Glucose, Bld: 105 — ABNORMAL HIGH
Glucose, Bld: 72
Potassium: 3.4 — ABNORMAL LOW
Potassium: 3.8
Sodium: 124 — ABNORMAL LOW
Sodium: 133 — ABNORMAL LOW

## 2011-01-25 LAB — CBC
HCT: 29.9 — ABNORMAL LOW
Hemoglobin: 10.6 — ABNORMAL LOW
WBC: 7.5

## 2011-01-26 LAB — HEMOGLOBIN AND HEMATOCRIT, BLOOD
HCT: 21.8 — ABNORMAL LOW
HCT: 31 — ABNORMAL LOW
HCT: 32 — ABNORMAL LOW
HCT: 32.2 — ABNORMAL LOW
HCT: 32.3 — ABNORMAL LOW
HCT: 34.5 — ABNORMAL LOW
Hemoglobin: 10.6 — ABNORMAL LOW
Hemoglobin: 11.1 — ABNORMAL LOW
Hemoglobin: 11.1 — ABNORMAL LOW
Hemoglobin: 11.2 — ABNORMAL LOW
Hemoglobin: 11.5 — ABNORMAL LOW
Hemoglobin: 12.1

## 2011-01-26 LAB — CBC
HCT: 42.2
Hemoglobin: 11.2 — ABNORMAL LOW
Hemoglobin: 11.7 — ABNORMAL LOW
Hemoglobin: 14.3
MCHC: 34.7
MCHC: 35.2
MCHC: 33.9
MCV: 88.4
Platelets: 129 — ABNORMAL LOW
Platelets: 193
RBC: 3.49 — ABNORMAL LOW
RBC: 3.78 — ABNORMAL LOW
RDW: 13.2
RDW: 12.7
WBC: 4.1
WBC: 8.1

## 2011-01-26 LAB — URINALYSIS, ROUTINE W REFLEX MICROSCOPIC
Bilirubin Urine: NEGATIVE
Glucose, UA: NEGATIVE
Ketones, ur: 15 — AB
Ketones, ur: NEGATIVE
Nitrite: NEGATIVE
Nitrite: NEGATIVE
Protein, ur: NEGATIVE
Specific Gravity, Urine: 1.023
Urobilinogen, UA: 0.2
pH: 6

## 2011-01-26 LAB — BASIC METABOLIC PANEL
BUN: 5 — ABNORMAL LOW
CO2: 22
Calcium: 7.8 — ABNORMAL LOW
Calcium: 8 — ABNORMAL LOW
Creatinine, Ser: 0.61
GFR calc Af Amer: >60
GFR calc Af Amer: >60
GFR calc non Af Amer: >60
Sodium: 134 — ABNORMAL LOW

## 2011-01-26 LAB — CROSSMATCH
ABO/RH(D): B POS
Antibody Screen: NEGATIVE

## 2011-01-26 LAB — DIFFERENTIAL
Lymphocytes Relative: 18
Lymphocytes Relative: 27
Lymphs Abs: 1.6
Lymphs Abs: 1.5
Monocytes Absolute: 0.5
Monocytes Relative: 8
Monocytes Relative: 9
Neutro Abs: 6.6
Neutro Abs: 3.4
Neutrophils Relative %: 73
Neutrophils Relative %: 61

## 2011-01-26 LAB — COMPREHENSIVE METABOLIC PANEL
ALT: 19
Albumin: 3.7
Alkaline Phosphatase: 110
BUN: 49 — ABNORMAL HIGH
BUN: 9
Calcium: 8.3 — ABNORMAL LOW
Calcium: 9.2
Creatinine, Ser: 0.65
Glucose, Bld: 133 — ABNORMAL HIGH
Glucose, Bld: 116 — ABNORMAL HIGH
Potassium: 3.7
Sodium: 142
Total Protein: 5.4 — ABNORMAL LOW
Total Protein: 6.6

## 2011-01-26 LAB — LIPASE, BLOOD: Lipase: 59

## 2011-01-26 LAB — PROTIME-INR: INR: 1.3

## 2011-01-26 LAB — PREPARE RBC (CROSSMATCH)

## 2011-03-05 ENCOUNTER — Encounter: Payer: Self-pay | Admitting: *Deleted

## 2011-03-15 ENCOUNTER — Other Ambulatory Visit: Payer: Self-pay | Admitting: Hematology and Oncology

## 2011-03-15 ENCOUNTER — Other Ambulatory Visit (HOSPITAL_BASED_OUTPATIENT_CLINIC_OR_DEPARTMENT_OTHER): Payer: Medicaid Other

## 2011-03-15 DIAGNOSIS — C494 Malignant neoplasm of connective and soft tissue of abdomen: Secondary | ICD-10-CM

## 2011-03-15 LAB — COMPREHENSIVE METABOLIC PANEL
Albumin: 4.1 g/dL (ref 3.5–5.2)
Alkaline Phosphatase: 146 U/L — ABNORMAL HIGH (ref 39–117)
BUN: 19 mg/dL (ref 6–23)
Calcium: 9.2 mg/dL (ref 8.4–10.5)
Creatinine, Ser: 0.69 mg/dL (ref 0.50–1.10)
Glucose, Bld: 94 mg/dL (ref 70–99)
Potassium: 3.8 mEq/L (ref 3.5–5.3)

## 2011-03-15 LAB — CBC WITH DIFFERENTIAL/PLATELET
Basophils Absolute: 0.1 10*3/uL (ref 0.0–0.1)
EOS%: 1.8 % (ref 0.0–7.0)
Eosinophils Absolute: 0.1 10*3/uL (ref 0.0–0.5)
HCT: 41.1 % (ref 34.8–46.6)
HGB: 14 g/dL (ref 11.6–15.9)
MCH: 30.1 pg (ref 25.1–34.0)
MCV: 88.2 fL (ref 79.5–101.0)
MONO%: 10.8 % (ref 0.0–14.0)
NEUT#: 4.6 10*3/uL (ref 1.5–6.5)
NEUT%: 67.3 % (ref 38.4–76.8)
Platelets: 183 10*3/uL (ref 145–400)
RDW: 13.7 % (ref 11.2–14.5)

## 2011-03-19 ENCOUNTER — Ambulatory Visit (HOSPITAL_BASED_OUTPATIENT_CLINIC_OR_DEPARTMENT_OTHER): Payer: Medicaid Other | Admitting: Hematology and Oncology

## 2011-03-19 VITALS — BP 150/86 | HR 78 | Temp 97.1°F | Ht 59.5 in | Wt 98.0 lb

## 2011-03-19 DIAGNOSIS — C494 Malignant neoplasm of connective and soft tissue of abdomen: Secondary | ICD-10-CM

## 2011-03-19 DIAGNOSIS — C49A0 Gastrointestinal stromal tumor, unspecified site: Secondary | ICD-10-CM | POA: Insufficient documentation

## 2011-03-19 NOTE — Progress Notes (Signed)
This office note has been dictated.

## 2011-03-19 NOTE — Progress Notes (Signed)
CC:   Angelia Mould. Derrell Lolling, M.D. Wilhemina Bonito. Marina Goodell, MD [name], M.D.  IDENTIFYING STATEMENT:  Ms. Berres is a 66 year old woman with a GI stromal tumor who presents for followup.  INTERIM HISTORY:  Ms. Dobransky is happy to report some improvement in her "abdominal pain."  She has had no vomiting.  She denies anorexia.  Her weight is stable.  MEDICATIONS:  Reviewed and updated.  ALLERGIES:  None.  PHYSICAL EXAMINATION:  General Appearance:  A well-appearing and well- nourished woman in no distress.  Vital Signs:  Pulse 78.  Blood pressure 150/86.  Temperature 97.1.  Respirations 20.  Weight is 98 pounds. HEENT:  Head is atraumatic, normocephalic.  Sclerae are anicteric. Mouth is moist.  Chest:  Clear.  CVS:  Unremarkable.  Abdomen:  Soft, nontender.  Bowel sounds present.  Extremities:  No edema.  LABORATORY DATA:  On 03/15/2011, white cell count 6.8, hemoglobin 14, hematocrit 41.1, platelets 183.  Sodium 143, potassium 3.8, chloride 106, CO2 27, BUN 19, creatinine 0.69, glucose 94, t. bilirubin 0.5, alkaline phosphatase 146, AST 87, ALT 155, calcium 9.2.  LDH 163.  IMPRESSION AND PLAN:  Ms. Schrodt is a 65 year old woman who is status post gastric resection on 03/18/2007 for an intermediate grade, 5.5 cm, gastrointestinal stromal tumor.  She began Gleevec on 07/06/2007 through March 2010.  Her liver test are elevated. Will obtain an ultrasound and repeat liver test in next month or so. We will telephone her with results. She follows up in 6-9 months' time or sooner if needed.    ______________________________ Laurice Record, M.D. LIO/MEDQ  D:  03/19/2011  T:  03/19/2011  Job:  [id number]

## 2011-03-22 ENCOUNTER — Other Ambulatory Visit (HOSPITAL_COMMUNITY): Payer: Self-pay | Admitting: Hematology and Oncology

## 2011-03-22 DIAGNOSIS — C49A0 Gastrointestinal stromal tumor, unspecified site: Secondary | ICD-10-CM

## 2011-04-09 ENCOUNTER — Telehealth: Payer: Self-pay | Admitting: Hematology and Oncology

## 2011-04-09 NOTE — Telephone Encounter (Signed)
Received staff message from LO to schedule pt for lb/us 1/2 (orders entered). S/w pt today re appts for 1/2 @ 10:30 am for lb and 11 am @ [facility] for [place]. Per pt mailed appts and scheduled interpreter for above visit (s/w lawanna).

## 2011-04-21 ENCOUNTER — Other Ambulatory Visit (HOSPITAL_BASED_OUTPATIENT_CLINIC_OR_DEPARTMENT_OTHER): Payer: Medicaid Other | Admitting: Lab

## 2011-04-21 ENCOUNTER — Ambulatory Visit (HOSPITAL_COMMUNITY)
Admission: RE | Admit: 2011-04-21 | Discharge: 2011-04-21 | Disposition: A | Discharge status: Home / Self Care | Payer: Medicaid Other | Source: Ambulatory Visit | Attending: Hematology and Oncology | Admitting: Hematology and Oncology

## 2011-04-21 ENCOUNTER — Telehealth: Payer: Self-pay | Admitting: Nurse Practitioner

## 2011-04-21 DIAGNOSIS — Z9089 Acquired absence of other organs: Secondary | ICD-10-CM | POA: Insufficient documentation

## 2011-04-21 DIAGNOSIS — C49A0 Gastrointestinal stromal tumor, unspecified site: Secondary | ICD-10-CM

## 2011-04-21 DIAGNOSIS — C494 Malignant neoplasm of connective and soft tissue of abdomen: Secondary | ICD-10-CM

## 2011-04-21 DIAGNOSIS — R7989 Other specified abnormal findings of blood chemistry: Secondary | ICD-10-CM | POA: Insufficient documentation

## 2011-04-21 LAB — HEPATIC FUNCTION PANEL
Albumin: 4.5 g/dL (ref 3.5–5.2)
Alkaline Phosphatase: 142 U/L — ABNORMAL HIGH (ref 39–117)
Bilirubin, Direct: 0.2 mg/dL (ref 0.0–0.3)
Indirect Bilirubin: 0.5 mg/dL (ref 0.0–0.9)
Total Bilirubin: 0.7 mg/dL (ref 0.3–1.2)

## 2011-04-21 NOTE — Telephone Encounter (Signed)
Spoke with patient.  Informed per Dr. Dalene Carrow, liver function labs are "much better". Pt. Verbalized understanding.

## 2011-08-23 ENCOUNTER — Other Ambulatory Visit (HOSPITAL_COMMUNITY)
Admission: RE | Admit: 2011-08-23 | Discharge: 2011-08-23 | Disposition: A | Discharge status: Home / Self Care | Payer: Medicaid Other | Source: Ambulatory Visit | Attending: Internal Medicine | Admitting: Internal Medicine

## 2011-08-23 DIAGNOSIS — Z01419 Encounter for gynecological examination (general) (routine) without abnormal findings: Secondary | ICD-10-CM | POA: Insufficient documentation

## 2011-08-23 DIAGNOSIS — Z1159 Encounter for screening for other viral diseases: Secondary | ICD-10-CM | POA: Insufficient documentation

## 2011-11-30 ENCOUNTER — Other Ambulatory Visit (HOSPITAL_BASED_OUTPATIENT_CLINIC_OR_DEPARTMENT_OTHER): Payer: Medicaid Other

## 2011-11-30 ENCOUNTER — Ambulatory Visit (HOSPITAL_COMMUNITY)
Admission: RE | Admit: 2011-11-30 | Discharge: 2011-11-30 | Disposition: A | Discharge status: Home / Self Care | Payer: Medicaid Other | Source: Ambulatory Visit | Attending: Hematology and Oncology | Admitting: Hematology and Oncology

## 2011-11-30 DIAGNOSIS — C494 Malignant neoplasm of connective and soft tissue of abdomen: Secondary | ICD-10-CM | POA: Insufficient documentation

## 2011-11-30 DIAGNOSIS — Z903 Acquired absence of stomach [part of]: Secondary | ICD-10-CM | POA: Insufficient documentation

## 2011-11-30 DIAGNOSIS — C49A0 Gastrointestinal stromal tumor, unspecified site: Secondary | ICD-10-CM

## 2011-11-30 DIAGNOSIS — Z9089 Acquired absence of other organs: Secondary | ICD-10-CM | POA: Insufficient documentation

## 2011-11-30 DIAGNOSIS — K7689 Other specified diseases of liver: Secondary | ICD-10-CM | POA: Insufficient documentation

## 2011-11-30 DIAGNOSIS — K838 Other specified diseases of biliary tract: Secondary | ICD-10-CM | POA: Insufficient documentation

## 2011-11-30 LAB — CBC WITH DIFFERENTIAL/PLATELET
Basophils Absolute: 0 10*3/uL (ref 0.0–0.1)
EOS%: 2.3 % (ref 0.0–7.0)
HCT: 44.8 % (ref 34.8–46.6)
HGB: 15.3 g/dL (ref 11.6–15.9)
MCH: 29.6 pg (ref 25.1–34.0)
MCV: 86.9 fL (ref 79.5–101.0)
NEUT%: 70.4 % (ref 38.4–76.8)
Platelets: 190 10*3/uL (ref 145–400)
lymph#: 1.3 10*3/uL (ref 0.9–3.3)

## 2011-11-30 LAB — CMP (CANCER CENTER ONLY)
Albumin: 4 g/dL (ref 3.3–5.5)
Alkaline Phosphatase: 136 U/L — ABNORMAL HIGH (ref 26–84)
BUN, Bld: 11 mg/dL (ref 7–22)
Calcium: 9.4 mg/dL (ref 8.0–10.3)
Glucose, Bld: 125 mg/dL — ABNORMAL HIGH (ref 73–118)
Potassium: 3.9 mEq/L (ref 3.3–4.7)

## 2011-11-30 MED ORDER — IOHEXOL 300 MG/ML  SOLN
100.0000 mL | Freq: Once | INTRAMUSCULAR | Status: AC | PRN
  Administered 2011-11-30: 80 mL via INTRAVENOUS

## 2011-12-02 ENCOUNTER — Telehealth: Payer: Self-pay | Admitting: Hematology and Oncology

## 2011-12-02 ENCOUNTER — Encounter: Payer: Self-pay | Admitting: Hematology and Oncology

## 2011-12-02 ENCOUNTER — Ambulatory Visit (HOSPITAL_BASED_OUTPATIENT_CLINIC_OR_DEPARTMENT_OTHER): Payer: Medicaid Other | Admitting: Hematology and Oncology

## 2011-12-02 VITALS — BP 127/80 | HR 81 | Temp 96.9°F | Resp 20 | Ht 59.5 in | Wt 100.9 lb

## 2011-12-02 DIAGNOSIS — C49A0 Gastrointestinal stromal tumor, unspecified site: Secondary | ICD-10-CM

## 2011-12-02 DIAGNOSIS — C494 Malignant neoplasm of connective and soft tissue of abdomen: Secondary | ICD-10-CM

## 2011-12-02 NOTE — Telephone Encounter (Signed)
appts made and printed for pt aom °

## 2011-12-02 NOTE — Progress Notes (Signed)
This office note has been dictated.

## 2011-12-02 NOTE — Progress Notes (Signed)
CC:   [name]. Derrell Lolling, M.D. [name], MD,FACG [name], M.D.  IDENTIFYING STATEMENT:  The patient is a 66 year old woman with history of a GI stromal tumor who presents for followup.  INTERVAL HISTORY:  The patient continues to report "chronic abdominal pain."  She was seen by Dr. Leone Payor 2 weeks ago and was given Prilosec and notes some relief.  She has not lost any weight.  Denies nausea or vomiting.  MEDICATIONS:  Reviewed and updated.  ALLERGIES:  None.  PHYSICAL EXAMINATION:  General:  The patient is alert and oriented x3. Vitals:  Pulse 81, blood pressure 177/80, temperature 100.9, respirations 20, weight is 100.9.  HEENT:  Head is atraumatic, normocephalic.  Mouth moist.  Chest:  Clear.  Abdomen:  Soft, nontender. Bowel sounds present.  Extremities:  No edema.  LABORATORY DATA:  11/30/2011 white cell count 6.8, hemoglobin 15.3, hematocrit 44.8, platelets 190.  Sodium 140, potassium 3.9, chloride 104, CO2 is 24, BUN 11, creatinine 0.8, glucose 125.  Total bilirubin 1.4, alkaline phosphatase 136, AST 31, ALT 47.  CT scan of the abdomen and pelvis on 11/30/2011 showed no evidence of GIST recurrence within the stomach.  There was no evidence of metastases in the abdomen or pelvis.  It did show progressive dilatation of the common bile duct and pancreatic duct.  IMPRESSION AND PLAN:  Ms. Gains is a 66 year old woman who is status post gastric resection on 03/18/2007 for an intermediate grade 5.5-cm gastrointestinal stromal tumor (GIST).  She received Gleevec from 07/06/2007 through March 2010.  Recent CT scan of the abdomen and pelvis showed no evidence of recurrence.  However, the common bile duct and pancreatic duct did show progressive dilatation.  The patient's liver function tests were unremarkable except for a mildly raised alkaline phosphatase.  I would like to defer these findings to her gastroenterologist, Dr. Leone Payor, to see if any further workup  such as an MRCP is required.  I also asked to see if an upper endoscopy is warranted for screening.  Otherwise from my standpoint, she follows up in 9 months' time with labs.    ______________________________ Laurice Record, M.D. LIO/MEDQ  D:  12/02/2011  T:  12/02/2011  Job:  [id number]

## 2011-12-02 NOTE — Patient Instructions (Signed)
[name]  [id number]  [facility] Discharge Instructions  RECOMMENDATIONS MADE BY THE CONSULTANT AND ANY TEST RESULTS WILL BE SENT TO YOUR REFERRING DOCTOR.   EXAM FINDINGS BY MD TODAY AND SIGNS AND SYMPTOMS TO REPORT TO CLINIC OR PRIMARY MD:   Your current list of medications are: Current Outpatient Prescriptions  Medication Sig Dispense Refill  . amLODipine (NORVASC) 10 MG tablet Take 10 mg by mouth daily.        Marland Kitchen omeprazole (PRILOSEC) 20 MG capsule Take 20 mg by mouth daily.           INSTRUCTIONS GIVEN AND DISCUSSED:   SPECIAL INSTRUCTIONS/FOLLOW-UP:  See above.  I acknowledge that I have been informed and understand all the instructions given to me and received a copy. I do not have any more questions at this time, but understand that I may call the [facility] at [phone] during business hours should I have any further questions or need assistance in obtaining follow-up care.

## 2011-12-02 NOTE — Telephone Encounter (Signed)
Pt also aware of dr Leone Payor appt     aom

## 2011-12-08 ENCOUNTER — Other Ambulatory Visit: Payer: Self-pay | Admitting: Hematology and Oncology

## 2011-12-08 ENCOUNTER — Ambulatory Visit (HOSPITAL_COMMUNITY)
Admission: RE | Admit: 2011-12-08 | Discharge: 2011-12-08 | Disposition: A | Discharge status: Home / Self Care | Payer: Medicaid Other | Source: Ambulatory Visit | Attending: Hematology and Oncology | Admitting: Hematology and Oncology

## 2011-12-08 DIAGNOSIS — C49A0 Gastrointestinal stromal tumor, unspecified site: Secondary | ICD-10-CM

## 2011-12-08 DIAGNOSIS — Z8500 Personal history of malignant neoplasm of unspecified digestive organ: Secondary | ICD-10-CM | POA: Insufficient documentation

## 2011-12-08 DIAGNOSIS — Z9089 Acquired absence of other organs: Secondary | ICD-10-CM | POA: Insufficient documentation

## 2011-12-08 DIAGNOSIS — R7989 Other specified abnormal findings of blood chemistry: Secondary | ICD-10-CM | POA: Insufficient documentation

## 2011-12-08 DIAGNOSIS — K7689 Other specified diseases of liver: Secondary | ICD-10-CM | POA: Insufficient documentation

## 2011-12-08 DIAGNOSIS — K838 Other specified diseases of biliary tract: Secondary | ICD-10-CM | POA: Insufficient documentation

## 2011-12-08 MED ORDER — GADOBENATE DIMEGLUMINE 529 MG/ML IV SOLN
10.0000 mL | Freq: Once | INTRAVENOUS | Status: AC | PRN
  Administered 2011-12-08: 9 mL via INTRAVENOUS

## 2011-12-09 NOTE — Progress Notes (Signed)
Quick Note:  We will get her an appointment  [name] - needs to see me in weeks after hospital week - assuming pains are not severe ______

## 2011-12-31 ENCOUNTER — Ambulatory Visit (INDEPENDENT_AMBULATORY_CARE_PROVIDER_SITE_OTHER): Payer: Medicaid Other | Admitting: Internal Medicine

## 2011-12-31 ENCOUNTER — Encounter: Payer: Self-pay | Admitting: Internal Medicine

## 2011-12-31 VITALS — BP 96/58 | HR 92 | Ht 59.25 in | Wt 99.4 lb

## 2011-12-31 DIAGNOSIS — K838 Other specified diseases of biliary tract: Secondary | ICD-10-CM

## 2011-12-31 DIAGNOSIS — R109 Unspecified abdominal pain: Secondary | ICD-10-CM

## 2011-12-31 DIAGNOSIS — Z8509 Personal history of malignant neoplasm of other digestive organs: Secondary | ICD-10-CM

## 2011-12-31 DIAGNOSIS — R1010 Upper abdominal pain, unspecified: Secondary | ICD-10-CM

## 2011-12-31 MED ORDER — PROMETHAZINE HCL 12.5 MG PO TABS: 12.5000 mg | ORAL_TABLET | Freq: Four times a day (QID) | ORAL | Status: DC | PRN

## 2011-12-31 NOTE — Progress Notes (Signed)
Subjective:    Patient ID: [name], female    DOB: 03/13/1946, 65 y.o.   MRN: [id number]  HPI interpreter present This is a very pleasant [place] woman with a history of a gastric GI ST tumor. She had that resected. She had a lot of functional problems with nausea and vomiting and epigastric pain that were improved and mostly relieved by amitriptyline in the past. During followup imaging she's had progressive dilation of the bile duct and pancreatic duct with some irregular beading of the left intrahepatic duct on MRCP. She continues to have epigastric pain that radiates to the back at times, and nausea and vomiting. She also still complains of headaches like she did in the past. She is no longer on the amitriptyline, we did not discuss that today. He is on Prilosec and amlodipine. Wt Readings from Last 3 Encounters:  12/31/11 99 lb 6 oz (45.076 kg)  12/02/11 100 lb 14.4 oz (45.768 kg)  03/19/11 98 lb (44.453 kg)   No Known Allergies Outpatient Prescriptions Prior to Visit  Medication Sig Dispense Refill  . amLODipine (NORVASC) 10 MG tablet Take 10 mg by mouth daily.        . omeprazole (PRILOSEC) 20 MG capsule Take 20 mg by mouth daily.         Past Medical History  Diagnosis Date  . gist dx'd 02/2007    gleevac comp  . 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 Hypertension   . Arthritis   . Internal hemorrhoids   . Vertigo    Past Surgical History  Procedure Date  . Gist resection   . Esophagogastroduodenoscopy   . Colonoscopy    History   Social History  . Marital Status: Widowed    Spouse Name: N/A    Number of Children: 1  . Years of Education: N/A   Occupational History  . UNEMPLOYED    Social History Main Topics  . Smoking status: Former Smoker -- 0.5 packs/day for 40 years    Types: Cigarettes  . Smokeless tobacco: Never Used  . Alcohol Use: No  . Drug Use: No    History reviewed. No pertinent family  history.  Review of Systems As above    Objective:   Physical Exam General:  NAD petite Asian woman Eyes:   anicteric Lungs:  clear Heart:  S1S2 no rubs, murmurs or gallops Abdomen:  soft and nontender, BS+ Ext:   no edema    Data Reviewed:  Recent MRCP, CT scanning  Mild intrahepatic/extrahepatic ductal dilatation, as described  above. No pancreatic head mass or choledocholithiasis is seen.  Primary differential considerations include distal CBD stricture or  nonvisualized calculus. Additional etiologies such as a primary  sclerosing cholangitis are also possible given the irregular/beaded  appearance within the left hepatic lobe.  In the setting of abnormal LFTs, consider ERCP for further  evaluation.   Review of LFTs are last year showed him to be fluctuating. More recently last month she had an elevated alkaline phosphatase there was mild but she's had 2-3 times abnormalities in her transaminases as well as alkaline phosphatase elevation over the last year.     Assessment & Plan:   1. Dilated bile duct   2. Upper abdominal pain   3. History of malignant gastrointestinal stromal tumor (GIST)    1. Given her overall history of think an ERCP is warranted. I explained the procedure to the interpreter including the risks   benefits and indications including the risk of pancreatitis and hospitalization. 2. Depending upon what is seen would consider therapeutic stenting trial. 3. Will reassess her GI ST resection site 4. Need to consider going back on amitriptyline for her chronic symptoms. Though perhaps some of these symptoms could of been related to biliary obstruction at some point.  I appreciate the opportunity to care for this patient.   CC: GRIFFIN,JOHN JOSEPH, MD and LAURETTA ODOGWU, MD  

## 2011-12-31 NOTE — Patient Instructions (Addendum)
You have been set up for an ERCP to be done at [facility] on October 4th at 10:30am, you need to arrive at admitting by 9:00am.   Nothing to eat or drink after midnight October 3rd.    We have sent the following medications to your pharmacy for you to pick up at your convenience: Phenergan for nausea  Thank you for choosing me and [facility].  [name], M.D., [facility]

## 2012-01-21 ENCOUNTER — Encounter (HOSPITAL_COMMUNITY)
Admission: RE | Disposition: A | Discharge status: Home / Self Care | Payer: Self-pay | Source: Ambulatory Visit | Attending: Internal Medicine

## 2012-01-21 ENCOUNTER — Encounter (HOSPITAL_COMMUNITY): Payer: Self-pay | Admitting: Anesthesiology

## 2012-01-21 ENCOUNTER — Ambulatory Visit (HOSPITAL_COMMUNITY): Payer: Medicaid Other

## 2012-01-21 ENCOUNTER — Encounter (HOSPITAL_COMMUNITY): Payer: Self-pay

## 2012-01-21 ENCOUNTER — Ambulatory Visit (HOSPITAL_COMMUNITY): Payer: Medicaid Other | Admitting: Anesthesiology

## 2012-01-21 ENCOUNTER — Inpatient Hospital Stay (HOSPITAL_COMMUNITY)
Admission: RE | Admit: 2012-01-21 | Discharge: 2012-01-29 | DRG: 438 | Disposition: A | Discharge status: Home / Self Care | Payer: Medicaid Other | Source: Ambulatory Visit | Attending: Internal Medicine | Admitting: Internal Medicine

## 2012-01-21 DIAGNOSIS — K859 Acute pancreatitis without necrosis or infection, unspecified: Principal | ICD-10-CM | POA: Diagnosis not present

## 2012-01-21 DIAGNOSIS — K838 Other specified diseases of biliary tract: Secondary | ICD-10-CM | POA: Diagnosis present

## 2012-01-21 DIAGNOSIS — R112 Nausea with vomiting, unspecified: Secondary | ICD-10-CM

## 2012-01-21 DIAGNOSIS — R42 Dizziness and giddiness: Secondary | ICD-10-CM | POA: Diagnosis present

## 2012-01-21 DIAGNOSIS — E876 Hypokalemia: Secondary | ICD-10-CM | POA: Diagnosis present

## 2012-01-21 DIAGNOSIS — I10 Essential (primary) hypertension: Secondary | ICD-10-CM | POA: Diagnosis present

## 2012-01-21 DIAGNOSIS — Z87891 Personal history of nicotine dependence: Secondary | ICD-10-CM

## 2012-01-21 DIAGNOSIS — Z8509 Personal history of malignant neoplasm of other digestive organs: Secondary | ICD-10-CM

## 2012-01-21 DIAGNOSIS — R1010 Upper abdominal pain, unspecified: Secondary | ICD-10-CM

## 2012-01-21 DIAGNOSIS — R1013 Epigastric pain: Secondary | ICD-10-CM

## 2012-01-21 DIAGNOSIS — K831 Obstruction of bile duct: Secondary | ICD-10-CM | POA: Diagnosis present

## 2012-01-21 DIAGNOSIS — Z8500 Personal history of malignant neoplasm of unspecified digestive organ: Secondary | ICD-10-CM

## 2012-01-21 DIAGNOSIS — K8689 Other specified diseases of pancreas: Secondary | ICD-10-CM | POA: Diagnosis present

## 2012-01-21 HISTORY — PX: ERCP: SHX5425

## 2012-01-21 HISTORY — DX: Acute pancreatitis without necrosis or infection, unspecified: K85.90

## 2012-01-21 SURGERY — ERCP, WITH INTERVENTION IF INDICATED
Anesthesia: General

## 2012-01-21 MED ORDER — LIDOCAINE HCL (CARDIAC) 20 MG/ML IV SOLN
INTRAVENOUS | Status: DC | PRN
  Administered 2012-01-21: 50 mg via INTRAVENOUS

## 2012-01-21 MED ORDER — SODIUM CHLORIDE 0.9 % IV SOLN: INTRAVENOUS | Status: DC

## 2012-01-21 MED ORDER — MIDAZOLAM HCL 5 MG/5ML IJ SOLN
INTRAMUSCULAR | Status: DC | PRN
  Administered 2012-01-21: 1 mg via INTRAVENOUS
  Administered 2012-01-21: 0.5 mg via INTRAVENOUS

## 2012-01-21 MED ORDER — SODIUM CHLORIDE 0.9 % IV SOLN
1.5000 g | Freq: Once | INTRAVENOUS | Status: DC
  Filled 2012-01-21: qty 1.5

## 2012-01-21 MED ORDER — PROMETHAZINE HCL 25 MG/ML IJ SOLN: 6.2500 mg | INTRAMUSCULAR | Status: DC | PRN

## 2012-01-21 MED ORDER — ONDANSETRON HCL 4 MG/2ML IJ SOLN
INTRAMUSCULAR | Status: DC | PRN
  Administered 2012-01-21: 4 mg via INTRAVENOUS

## 2012-01-21 MED ORDER — FENTANYL CITRATE 0.05 MG/ML IJ SOLN
25.0000 ug | Freq: Once | INTRAMUSCULAR | Status: AC
  Administered 2012-01-21: 25 ug via INTRAVENOUS

## 2012-01-21 MED ORDER — SODIUM CHLORIDE 0.9 % IV BOLUS (SEPSIS)
1000.0000 mL | Freq: Once | INTRAVENOUS | Status: AC
  Administered 2012-01-21: 1000 mL via INTRAVENOUS

## 2012-01-21 MED ORDER — KCL IN DEXTROSE-NACL 20-5-0.45 MEQ/L-%-% IV SOLN
INTRAVENOUS | Status: DC
  Administered 2012-01-21: 21:00:00 via INTRAVENOUS
  Filled 2012-01-21 (×2): qty 1000

## 2012-01-21 MED ORDER — NEOSTIGMINE METHYLSULFATE 1 MG/ML IJ SOLN
INTRAMUSCULAR | Status: DC | PRN
  Administered 2012-01-21: 3 mg via INTRAVENOUS

## 2012-01-21 MED ORDER — LACTATED RINGERS IV SOLN
INTRAVENOUS | Status: DC | PRN
  Administered 2012-01-21 (×2): via INTRAVENOUS

## 2012-01-21 MED ORDER — ONDANSETRON HCL 4 MG PO TABS: 4.0000 mg | ORAL_TABLET | Freq: Four times a day (QID) | ORAL | Status: DC | PRN

## 2012-01-21 MED ORDER — HYDROMORPHONE HCL PF 1 MG/ML IJ SOLN
1.0000 mg | INTRAMUSCULAR | Status: DC | PRN
  Administered 2012-01-21 – 2012-01-23 (×10): 1 mg via INTRAVENOUS
  Filled 2012-01-21 (×10): qty 1

## 2012-01-21 MED ORDER — SODIUM CHLORIDE 0.9 % IV SOLN
3.0000 g | INTRAVENOUS | Status: DC | PRN
  Administered 2012-01-21: 1.5 g via INTRAVENOUS

## 2012-01-21 MED ORDER — CISATRACURIUM BESYLATE (PF) 10 MG/5ML IV SOLN
INTRAVENOUS | Status: DC | PRN
  Administered 2012-01-21: 6 mg via INTRAVENOUS

## 2012-01-21 MED ORDER — LACTATED RINGERS IV SOLN: INTRAVENOUS | Status: DC

## 2012-01-21 MED ORDER — ONDANSETRON HCL 4 MG/2ML IJ SOLN
INTRAMUSCULAR | Status: AC
  Filled 2012-01-21: qty 2

## 2012-01-21 MED ORDER — ONDANSETRON HCL 4 MG/2ML IJ SOLN
4.0000 mg | Freq: Four times a day (QID) | INTRAMUSCULAR | Status: DC | PRN
  Administered 2012-01-22 – 2012-01-28 (×3): 4 mg via INTRAVENOUS
  Filled 2012-01-21 (×5): qty 2

## 2012-01-21 MED ORDER — INFLUENZA VIRUS VACC SPLIT PF IM SUSP
0.5000 mL | INTRAMUSCULAR | Status: AC
  Filled 2012-01-21: qty 0.5

## 2012-01-21 MED ORDER — SODIUM CHLORIDE 0.9 % IV SOLN
INTRAVENOUS | Status: AC
  Filled 2012-01-21: qty 1.5

## 2012-01-21 MED ORDER — ACETAMINOPHEN 650 MG RE SUPP: 650.0000 mg | Freq: Four times a day (QID) | RECTAL | Status: DC | PRN

## 2012-01-21 MED ORDER — ACETAMINOPHEN 325 MG PO TABS: 650.0000 mg | ORAL_TABLET | Freq: Four times a day (QID) | ORAL | Status: DC | PRN

## 2012-01-21 MED ORDER — INDOMETHACIN 50 MG RE SUPP
100.0000 mg | Freq: Once | RECTAL | Status: AC
  Administered 2012-01-21: 100 mg via RECTAL
  Filled 2012-01-21: qty 2

## 2012-01-21 MED ORDER — DEXAMETHASONE SODIUM PHOSPHATE 10 MG/ML IJ SOLN
INTRAMUSCULAR | Status: DC | PRN
  Administered 2012-01-21: 10 mg via INTRAVENOUS

## 2012-01-21 MED ORDER — GLYCOPYRROLATE 0.2 MG/ML IJ SOLN
INTRAMUSCULAR | Status: DC | PRN
  Administered 2012-01-21: 0.4 mg via INTRAVENOUS

## 2012-01-21 MED ORDER — LACTATED RINGERS IV SOLN
INTRAVENOUS | Status: DC
  Administered 2012-01-21: 1000 mL via INTRAVENOUS

## 2012-01-21 MED ORDER — PANTOPRAZOLE SODIUM 40 MG IV SOLR
40.0000 mg | Freq: Every day | INTRAVENOUS | Status: DC
  Administered 2012-01-21 – 2012-01-26 (×6): 40 mg via INTRAVENOUS
  Filled 2012-01-21 (×8): qty 40

## 2012-01-21 MED ORDER — PNEUMOCOCCAL VAC POLYVALENT 25 MCG/0.5ML IJ INJ
0.5000 mL | INJECTION | INTRAMUSCULAR | Status: AC
  Filled 2012-01-21: qty 0.5

## 2012-01-21 MED ORDER — PROPOFOL 10 MG/ML IV EMUL
INTRAVENOUS | Status: DC | PRN
  Administered 2012-01-21: 80 mg via INTRAVENOUS

## 2012-01-21 MED ORDER — SODIUM CHLORIDE 0.9 % IV SOLN
INTRAVENOUS | Status: DC | PRN
  Administered 2012-01-21: 12:00:00

## 2012-01-21 MED ORDER — BIOTENE DRY MOUTH MT LIQD
15.0000 mL | Freq: Two times a day (BID) | OROMUCOSAL | Status: DC
  Administered 2012-01-22 – 2012-01-29 (×9): 15 mL via OROMUCOSAL

## 2012-01-21 MED ORDER — FENTANYL CITRATE 0.05 MG/ML IJ SOLN
INTRAMUSCULAR | Status: DC | PRN
  Administered 2012-01-21 (×2): 25 ug via INTRAVENOUS
  Administered 2012-01-21: 50 ug via INTRAVENOUS

## 2012-01-21 MED ORDER — ONDANSETRON HCL 4 MG/2ML IJ SOLN
4.0000 mg | Freq: Once | INTRAMUSCULAR | Status: AC
  Administered 2012-01-21: 4 mg via INTRAVENOUS

## 2012-01-21 MED ORDER — MORPHINE SULFATE 2 MG/ML IJ SOLN
2.0000 mg | INTRAMUSCULAR | Status: DC | PRN
  Administered 2012-01-21: 2 mg via INTRAVENOUS
  Filled 2012-01-21: qty 1

## 2012-01-21 MED ORDER — AMLODIPINE BESYLATE 10 MG PO TABS
10.0000 mg | ORAL_TABLET | Freq: Every day | ORAL | Status: DC
  Administered 2012-01-21 – 2012-01-29 (×9): 10 mg via ORAL
  Filled 2012-01-21 (×9): qty 1

## 2012-01-21 MED ORDER — SUCCINYLCHOLINE CHLORIDE 20 MG/ML IJ SOLN
INTRAMUSCULAR | Status: DC | PRN
  Administered 2012-01-21: 80 mg via INTRAVENOUS

## 2012-01-21 MED ORDER — FENTANYL CITRATE 0.05 MG/ML IJ SOLN
INTRAMUSCULAR | Status: AC
  Filled 2012-01-21: qty 2

## 2012-01-21 NOTE — Progress Notes (Signed)
Pt. Having abdominal pain.  Belly soft but unable to pass air.  Seen by Dr. Leone Payor.  Also dryheaving because of nausea.  Pt. Voided using bedpan 250 ccs amber, clear urine.  No interpreter here at this time.  Pt. Given 4 mg Zofran and Femtanyl per order of Dr. Leone Payor per IV.  Repositioned on left side and resting at this time.  O2 on at 2L per [place].  VSS

## 2012-01-21 NOTE — Progress Notes (Signed)
Patient awakened with  Complaints of abdominal pain. Dr Leone Payor notified in to examine patient. Orders given for pain medication and meds for nausea as patient is gagging and attempting to vomit. At MD order patient given 25 mcg fentanyl and Zofran 4mg  IV at 1430

## 2012-01-21 NOTE — Interval H&P Note (Signed)
History and Physical Interval Note:  01/21/2012 10:05 AM  [name]  has presented today for surgery, with the diagnosis of dilated bile duct, upper abd. pain,hx of GIST  The various methods of treatment have been discussed with the patient and family. After consideration of risks, benefits and other options for treatment, the patient has consented to  Procedure(s) (LRB) with comments: ENDOSCOPIC RETROGRADE CHOLANGIOPANCREATOGRAPHY (ERCP) (N/A) - Needs interpreter- vietnanese as a surgical intervention .  The patient's history has been reviewed, patient examined, no change in status, stable for surgery.  I have reviewed the patient's chart and labs.  Questions were answered to the patient's satisfaction.     Stan Head, MD, [facility]

## 2012-01-21 NOTE — Progress Notes (Signed)
Dr. Leone Payor has been in to see patient. Approved interpreter used for data base.

## 2012-01-21 NOTE — Anesthesia Preprocedure Evaluation (Signed)
Anesthesia Evaluation  Patient identified by MRN, date of birth, ID band Patient awake    Reviewed: Allergy & Precautions, H&P , NPO status , Patient's Chart, lab work & pertinent test results  Airway Mallampati: II TM Distance: >3 FB Neck ROM: Full    Dental  (+) Edentulous Upper and Edentulous Lower   Pulmonary neg pulmonary ROS,  breath sounds clear to auscultation  Pulmonary exam normal       Cardiovascular hypertension, Rhythm:Regular Rate:Normal     Neuro/Psych negative neurological ROS  negative psych ROS   GI/Hepatic Neg liver ROS, GERD-  Medicated,  Endo/Other  negative endocrine ROS  Renal/GU negative Renal ROS  negative genitourinary   Musculoskeletal negative musculoskeletal ROS (+)   Abdominal   Peds negative pediatric ROS (+)  Hematology negative hematology ROS (+)   Anesthesia Other Findings   Reproductive/Obstetrics negative OB ROS                           Anesthesia Physical Anesthesia Plan  ASA: II  Anesthesia Plan: General   Post-op Pain Management:    Induction: Intravenous  Airway Management Planned: Oral ETT  Additional Equipment:   Intra-op Plan:   Post-operative Plan: Extubation in OR  Informed Consent: I have reviewed the patients History and Physical, chart, labs and discussed the procedure including the risks, benefits and alternatives for the proposed anesthesia with the patient or authorized representative who has indicated his/her understanding and acceptance.     Plan Discussed with: CRNA  Anesthesia Plan Comments:         Anesthesia Quick Evaluation

## 2012-01-21 NOTE — Anesthesia Postprocedure Evaluation (Signed)
Anesthesia Post Note  Patient: [name]  Procedure(s) Performed: Procedure(s) (LRB): ENDOSCOPIC RETROGRADE CHOLANGIOPANCREATOGRAPHY (ERCP) (N/A)  Anesthesia type: MAC  Patient location: PACU  Post pain: Pain level controlled  Post assessment: Post-op Vital signs reviewed  Last Vitals:  Filed Vitals:   01/21/12 1313  BP: 145/78  Pulse: 68  Temp: 36.6 C  Resp: 18    Post vital signs: Reviewed  Level of consciousness: sedated  Complications: No apparent anesthesia complications

## 2012-01-21 NOTE — Op Note (Addendum)
[facility] [address] [place], [address]   ERCP PROCEDURE REPORT  PATIENT: Dawn, Koestner T.  MR# :[id number] BIRTHDATE: 1945-09-12  GENDER: Female ENDOSCOPIST: [name], MD, Clementeen Graham REFERRED BY: Arlan Organ, M.D. PROCEDURE DATE:  01/21/2012 PROCEDURE:   ERCP with sphincterotomy/papillotomy ASA CLASS:   Class II INDICATIONS:dilated bile duct on MRCP, history of gastric GIST (has had epigastric pain, abnormal LFT's also) MEDICATIONS: See Anesthesia Report and Antibiotics   IV Unasyn 1.5 grams TOPICAL ANESTHETIC: none  DESCRIPTION OF PROCEDURE:   After the risks benefits and alternatives of the procedure were thoroughly explained, informed consent was obtained.  The Pentax ERCP [id number]  endoscope was introduced through the mouth  and advanced to the second portion of the duodenum .Esophagus not seen well. The stomach and duodenum were normal, s/p wedge gastric resection.  1.  The major ampulla was located in the second portion of the duodenum.  The ampulla appeared normal but was somewhat small. It was cannulated and at first wire would only enter pancreas, confirmed by contrast injection - partial filling. wire left in and second wire used to cannulate CBD. It was then injected, and in order to allow passage of balloon into duct a biliary sphincterotomy was performed. balloon occlusion cholangiogram used to opacify intrahepatics. S/P cholecystectomy. 2.  There was diffuse dilation of the common bile duct and common hemaptic duct measuring 13mm maximum 3.  A localized irregularity was found in the left intrahepatic branches. about 2 branches in periphery with slight irrecularity. 4.  Mild ductal dilation was found at the head of the pancreatic duct and neck, with partial filling and no tapering seen. The scope was then completely withdrawn from the patient and the procedure terminated.     COMPLICATIONS: .  There were no  complications.  ENDOSCOPIC IMPRESSION: 1.   The ampulla was located in the second portion of the duodenum and was somewhat small - sphincterotomy was performed to allow passage of a balloon catheter. (? if this may help relieve chronic intermittent epigastric pain - she does have the bile duct dilation and has had abnormal LFT's) 2.   There was dilation of the extrahepatic bile ducts measuring 13 mm maximum. 3.   A localized irregularity was found in the left intrahepatic branches - about 2 peripheral branches with slight irregularity of doubtful significance. 4.    Mild dilation was found in the head and neck of the pancreatic duct - partially filled by intent  RECOMMENDATIONS: Call my office next week to get appointment for early November. Liquids only today, try solids tomorrow.      eSigned:  [name], MD, [facility] 01/21/2012 1:35 PM Revised: 01/21/2012 1:35 PM  VH:QIONGEXB Odogwu, MD The Patient

## 2012-01-21 NOTE — Preoperative (Signed)
Beta Blockers   Reason not to administer Beta Blockers:Not Applicable 

## 2012-01-21 NOTE — H&P (View-Only) (Signed)
Subjective:    Patient ID: [name], female    DOB: December 21, 1945, 66 y.o.   MRN: [id number]  HPI interpreter present This is a very pleasant [place] woman with a history of a gastric GI ST tumor. She had that resected. She had a lot of functional problems with nausea and vomiting and epigastric pain that were improved and mostly relieved by amitriptyline in the past. During followup imaging she's had progressive dilation of the bile duct and pancreatic duct with some irregular beading of the left intrahepatic duct on MRCP. She continues to have epigastric pain that radiates to the back at times, and nausea and vomiting. She also still complains of headaches like she did in the past. She is no longer on the amitriptyline, we did not discuss that today. He is on Prilosec and amlodipine. Wt Readings from Last 3 Encounters:  12/31/11 99 lb 6 oz (45.076 kg)  12/02/11 100 lb 14.4 oz (45.768 kg)  03/19/11 98 lb (44.453 kg)   No Known Allergies Outpatient Prescriptions Prior to Visit  Medication Sig Dispense Refill  . amLODipine (NORVASC) 10 MG tablet Take 10 mg by mouth daily.        Marland Kitchen omeprazole (PRILOSEC) 20 MG capsule Take 20 mg by mouth daily.         Past Medical History  Diagnosis Date  . gist dx'd 02/2007    gleevac comp  . 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 Hypertension   . Arthritis   . Internal hemorrhoids   . Vertigo    Past Surgical History  Procedure Date  . Gist resection   . Esophagogastroduodenoscopy   . Colonoscopy    History   Social History  . Marital Status: Widowed    Spouse Name: N/A    Number of Children: 1  . Years of Education: N/A   Occupational History  . UNEMPLOYED    Social History Main Topics  . Smoking status: Former Smoker -- 0.5 packs/day for 40 years    Types: Cigarettes  . Smokeless tobacco: Never Used  . Alcohol Use: No  . Drug Use: No    History reviewed. No pertinent family  history.  Review of Systems As above    Objective:   Physical Exam General:  NAD petite Asian woman Eyes:   anicteric Lungs:  clear Heart:  S1S2 no rubs, murmurs or gallops Abdomen:  soft and nontender, BS+ Ext:   no edema    Data Reviewed:  Recent MRCP, CT scanning  Mild intrahepatic/extrahepatic ductal dilatation, as described  above. No pancreatic head mass or choledocholithiasis is seen.  Primary differential considerations include distal CBD stricture or  nonvisualized calculus. Additional etiologies such as a primary  sclerosing cholangitis are also possible given the irregular/beaded  appearance within the left hepatic lobe.  In the setting of abnormal LFTs, consider ERCP for further  evaluation.   Review of LFTs are last year showed him to be fluctuating. More recently last month she had an elevated alkaline phosphatase there was mild but she's had 2-3 times abnormalities in her transaminases as well as alkaline phosphatase elevation over the last year.     Assessment & Plan:   1. Dilated bile duct   2. Upper abdominal pain   3. History of malignant gastrointestinal stromal tumor (GIST)    1. Given her overall history of think an ERCP is warranted. I explained the procedure to the interpreter including the risks  benefits and indications including the risk of pancreatitis and hospitalization. 2. Depending upon what is seen would consider therapeutic stenting trial. 3. Will reassess her GI ST resection site 4. Need to consider going back on amitriptyline for her chronic symptoms. Though perhaps some of these symptoms could of been related to biliary obstruction at some point.  I appreciate the opportunity to care for this patient.   CC: Lillia Mountain, MD and Arlan Organ, MD

## 2012-01-21 NOTE — Transfer of Care (Signed)
Immediate Anesthesia Transfer of Care Note  Patient: [name]  Procedure(s) Performed: Procedure(s) (LRB) with comments: ENDOSCOPIC RETROGRADE CHOLANGIOPANCREATOGRAPHY (ERCP) (N/A) - Needs interpreter- vietnanese  Patient Location: PACU  Anesthesia Type: General  Level of Consciousness: awake, alert , oriented and patient cooperative  Airway & Oxygen Therapy: Patient Spontanous Breathing and Patient connected to T-piece oxygen  Post-op Assessment: Report given to PACU RN, Post -op Vital signs reviewed and stable and Patient moving all extremities  Post vital signs: Reviewed and stable  Complications: No apparent anesthesia complications

## 2012-01-22 ENCOUNTER — Encounter (HOSPITAL_COMMUNITY): Payer: Self-pay | Admitting: Internal Medicine

## 2012-01-22 DIAGNOSIS — K859 Acute pancreatitis without necrosis or infection, unspecified: Principal | ICD-10-CM

## 2012-01-22 DIAGNOSIS — R112 Nausea with vomiting, unspecified: Secondary | ICD-10-CM

## 2012-01-22 DIAGNOSIS — R1013 Epigastric pain: Secondary | ICD-10-CM

## 2012-01-22 HISTORY — DX: Acute pancreatitis without necrosis or infection, unspecified: K85.90

## 2012-01-22 LAB — COMPREHENSIVE METABOLIC PANEL
ALT: 248 U/L — ABNORMAL HIGH (ref 0–35)
Albumin: 3.5 g/dL (ref 3.5–5.2)
Alkaline Phosphatase: 172 U/L — ABNORMAL HIGH (ref 39–117)
BUN: 10 mg/dL (ref 6–23)
Chloride: 103 mEq/L (ref 96–112)
GFR calc Af Amer: >90 mL/min (ref >90)
Glucose, Bld: 177 mg/dL — ABNORMAL HIGH (ref 70–99)
Potassium: 3.3 mEq/L — ABNORMAL LOW (ref 3.5–5.1)
Sodium: 135 mEq/L (ref 135–145)
Total Bilirubin: 0.7 mg/dL (ref 0.3–1.2)

## 2012-01-22 LAB — CBC
HCT: 37.4 % (ref 36.0–46.0)
Hemoglobin: 13.3 g/dL (ref 12.0–15.0)
WBC: 13.4 10*3/uL — ABNORMAL HIGH (ref 4.0–10.5)

## 2012-01-22 LAB — AMYLASE: Amylase: 1296 U/L — ABNORMAL HIGH (ref 0–105)

## 2012-01-22 MED ORDER — ENOXAPARIN SODIUM 30 MG/0.3ML ~~LOC~~ SOLN
30.0000 mg | SUBCUTANEOUS | Status: DC
  Administered 2012-01-22 – 2012-01-28 (×7): 30 mg via SUBCUTANEOUS
  Filled 2012-01-22 (×8): qty 0.3

## 2012-01-22 MED ORDER — POTASSIUM CHLORIDE 10 MEQ/100ML IV SOLN
10.0000 meq | INTRAVENOUS | Status: AC
  Administered 2012-01-22 (×4): 10 meq via INTRAVENOUS
  Filled 2012-01-22 (×4): qty 100

## 2012-01-22 MED ORDER — PROMETHAZINE HCL 25 MG RE SUPP
25.0000 mg | Freq: Four times a day (QID) | RECTAL | Status: DC | PRN
  Administered 2012-01-22 – 2012-01-25 (×5): 25 mg via RECTAL
  Filled 2012-01-22 (×5): qty 1

## 2012-01-22 MED ORDER — SODIUM CHLORIDE 0.9 % IV BOLUS (SEPSIS)
500.0000 mL | Freq: Once | INTRAVENOUS | Status: AC
  Administered 2012-01-22: 500 mL via INTRAVENOUS

## 2012-01-22 MED ORDER — KCL IN DEXTROSE-NACL 40-5-0.45 MEQ/L-%-% IV SOLN
INTRAVENOUS | Status: DC
  Administered 2012-01-22: 125 mL/h via INTRAVENOUS
  Administered 2012-01-23: 100 mL/h via INTRAVENOUS
  Administered 2012-01-23 – 2012-01-24 (×3): via INTRAVENOUS
  Filled 2012-01-22 (×6): qty 1000

## 2012-01-22 NOTE — Progress Notes (Signed)
ANTICOAGULATION CONSULT NOTE - Initial Consult  Pharmacy Consult for Lovenox Indication: VTE prophylaxis  No Known Allergies  Patient Measurements: Height: 5\' 2"  (157.5 cm) (Documented in Chart from 01/21/12) Weight: 98 lb 1.7 oz (44.5 kg) (Documented in chart from 10/4) IBW/kg (Calculated) : 50.1  Heparin Dosing Weight: 44.5kg  Vital Signs: Temp: 98.1 F (36.7 C) (10/05 0405) Temp src: Oral (10/05 0405) BP: 137/59 mmHg (10/05 0405) Pulse Rate: 74  (10/05 0405)  Labs: No results found for this basename: HGB:2,HCT:3,PLT:3,APTT:3,LABPROT:3,INR:3,HEPARINUNFRC:3,CREATININE:3,CKTOTAL:3,CKMB:3,TROPONINI:3 in the last 72 hours  Estimated Creatinine Clearance: 49.9 ml/min (by C-G formula based on Cr of 0.8).   Medical History: Past Medical History  Diagnosis Date  . gist dx'd 02/2007    gleevac comp  . 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 Hypertension   . Arthritis   . Internal hemorrhoids   . Vertigo     Medications:  Scheduled:    . amLODipine  10 mg Oral Daily  . antiseptic oral rinse  15 mL Mouth Rinse BID  . fentaNYL  25 mcg Intravenous Once  . indomethacin  100 mg Rectal Once  . influenza  inactive virus vaccine  0.5 mL Intramuscular Tomorrow-1000  . ondansetron  4 mg Intravenous Once  . pantoprazole (PROTONIX) IV  40 mg Intravenous QHS  . pneumococcal 23 valent vaccine  0.5 mL Intramuscular Tomorrow-1000  . sodium chloride  1,000 mL Intravenous Once  . sodium chloride  500 mL Intravenous Once  . DISCONTD: ampicillin-sulbactam (UNASYN) IV  1.5 g Intravenous Once   Infusions:    . dextrose 5 % and 0.45 % NaCl with KCl 20 mEq/L 75 mL/hr at 01/21/12 2044  . DISCONTD: sodium chloride    . DISCONTD: lactated ringers 1,000 mL (01/21/12 1018)  . DISCONTD: lactated ringers     PRN: acetaminophen, acetaminophen, HYDROmorphone (DILAUDID) injection, ondansetron (ZOFRAN) IV, ondansetron, promethazine, DISCONTD:  morphine injection,  DISCONTD: Omnipaque 350 mg/mL (50 mL) in 0.9% normal saline (50 mL), DISCONTD: promethazine  Assessment: 65 yo F who had ERCP. Epigastric abdominal pain with n/v/dry heaves and vertigo. Now inpatient and for VTE ppx. Goal of Therapy:  4 hour heparin level goal = 0.3-0.6 units/ml Monitor platelets by anticoagulation protocol: Yes   Plan:  Lovenox 30mg  SQ q 24 hours. This was based upon CrCl ~49 ml/min and patient's weight being <45 kg (44.5kg)   Loletta Specter 01/22/2012,9:24 AM

## 2012-01-22 NOTE — Progress Notes (Addendum)
     [facility] Daily Rounding Note 01/22/2012, 9:09 AM  SUBJECTIVE: Interpreter used. Decreased epigastric abdominal pain - but lots of nausea, dry heaves and some vertigo described. No flatus or bowel movement OBJECTIVE:   General: NAD, looks better than yesterday  Vital signs in last 24 hours: Temp:  [95 F (35 C)-98.1 F (36.7 C)] 98.1 F (36.7 C) (10/05 0405) Pulse Rate:  [63-83] 74  (10/05 0405) Resp:  [9-31] 16  (10/05 0405) BP: (69-173)/(22-96) 137/59 mmHg (10/05 0405) SpO2:  [95 %-100 %] 95 % (10/05 0405) Weight:  [98 lb (44.453 kg)] 98 lb (44.453 kg) (10/04 1540)    Heart: RRR S1S2 no murmur Chest: clear Abdomen: soft, quiet, tender in epigastrium  Extremities: no edema Neuro/Psych:  Calm, appropriate affect. No nystagmus  Intake/Output from previous day: 10/04 0701 - 10/05 0700 In: 2200 [I.V.:2200] Out: 601 [Urine:600; Emesis/NG output:1]      Studies/Results: Dg Ercp With Sphincterotomy  01/21/2012  *RADIOLOGY REPORT*  Clinical Data: Dilated bile duct, upper abdominal pain, history of GIST  ERCP  Comparison:  MRCP - 12/08/2011; abdominal CT - 11/30/2011  Findings:  Eight spot fluoroscopic intraoperative images during ERCP are provided for review.  Initial spot radiographic images demonstrates an ERCP probe tip overlying the right upper abdominal quadrant.  Cholecystectomy clips are seen overlying the expected location of the gallbladder fossa.  Subsequent images demonstrates selective cannulation of the common bile duct.  Contrast injection redemonstrates mild dilatation of the CBD. There is mild dilatation of the central intrahepatic biliary system.  No significant peripheral biliary ductal dilatation.  No discrete filling defect is seen within the opacified portions of the biliary tree.  Subsequent images demonstrate insufflation of a balloon within the proximal common hepatic duct.  Final image demonstrates the ERCP probe overlying the expected location of the  distal esophagus with residual contrast in a standing column within the dilated CBD, nonspecific but suggestive of stricture or spasm.  IMPRESSION: ERCP as above.   Original Report Authenticated By: Waynard Reeds, M.D.     ASSESMENT: 1) Epigastric pain 2) Nausea and vomiting 3) Vertigo  All after ERCP to evaluate dilated bile duct in setting of prior GIST of stomach She also has a hx of chronic and recurrent nausea and vomiting with epigastric pain   PLAN: 1) Change to inpatient 2) check labs 3) add promethazine suppository 4) increase IVF's 5) add VTE prohylaxis   LOS: 1 day   Stan Head  01/22/2012, 9:09 AM  Pager [phone]   Labs show amylase >1200 - thus - diagnosis is post-ERCP pancreatitis. She also has hypokalemia - will treat. Glucose 177 - will watch for that going higher before initiating GBS checks

## 2012-01-22 NOTE — Progress Notes (Addendum)
Pt reported that she vomited 5 times last night.  I was not aware of it at the time.  She didn't call for help.  I administered  Zofran IV.  Pt reported it helped her nausea.  Pt has no VTE ordered.  This needs to be addressed.

## 2012-01-23 DIAGNOSIS — K859 Acute pancreatitis without necrosis or infection, unspecified: Principal | ICD-10-CM

## 2012-01-23 LAB — CBC
Hemoglobin: 13.2 g/dL (ref 12.0–15.0)
MCH: 28.9 pg (ref 26.0–34.0)
MCHC: 34.3 g/dL (ref 30.0–36.0)
MCV: 84.4 fL (ref 78.0–100.0)
RBC: 4.56 MIL/uL (ref 3.87–5.11)

## 2012-01-23 LAB — BASIC METABOLIC PANEL
CO2: 24 mEq/L (ref 19–32)
Calcium: 8.5 mg/dL (ref 8.4–10.5)
Creatinine, Ser: 0.6 mg/dL (ref 0.50–1.10)
GFR calc non Af Amer: >90 mL/min (ref >90)
Glucose, Bld: 138 mg/dL — ABNORMAL HIGH (ref 70–99)
Sodium: 133 mEq/L — ABNORMAL LOW (ref 135–145)

## 2012-01-23 MED ORDER — HYDROMORPHONE HCL PF 1 MG/ML IJ SOLN
1.0000 mg | INTRAMUSCULAR | Status: DC | PRN
  Administered 2012-01-23 – 2012-01-27 (×33): 1 mg via INTRAVENOUS
  Filled 2012-01-23 (×33): qty 1

## 2012-01-23 NOTE — Progress Notes (Signed)
     [facility] Daily Rounding Note 01/23/2012, 9:00 AM  SUBJECTIVE: Nausea and vomiting heaves appear to be gone after adding promethazine suppository tx. Still has abdominal pain, a bit lower in epigastrium and radiates to back as before - hydromorphone works but only lasts 2 hours. No flatus or defecation. Hx taken via interpreter OBJECTIVE: General: NAD   Vital signs in last 24 hours: Temp:  [97.5 F (36.4 C)-99.3 F (37.4 C)] 99.3 F (37.4 C) (10/06 0515) Pulse Rate:  [73-96] 96  (10/06 0515) Resp:  [18-20] 18  (10/06 0515) BP: (121-151)/(69-76) 121/71 mmHg (10/06 0515) SpO2:  [92 %-96 %] 95 % (10/06 0515)    Heart: RRR S1S2 no murmur Chest: clear Abdomen: soft, a few BS are heard, tender - moderate in epigastrium with some guarding  Extremities: no edema Neuro/Psych:  Appropriate affect  Intake/Output from previous day: 10/05 0701 - 10/06 0700 In: 1220 [P.O.:120; I.V.:1100] Out: 1200 [Urine:1200]      Lab Results:  Basename 01/23/12 0425 01/22/12 1010  WBC 11.9* 13.4*  HGB 13.2 13.3  HCT 38.5 37.4  PLT 162 178   BMET  Basename 01/23/12 0425 01/22/12 1010  NA 133* 135  K 4.4 3.3*  CL 101 103  CO2 24 22  GLUCOSE 138* 177*  BUN 9 10  CREATININE 0.60 0.53  CALCIUM 8.5 8.6   LFT  Basename 01/22/12 1010  PROT 6.2  ALBUMIN 3.5  AST 209*  ALT 248*  ALKPHOS 172*  BILITOT 0.7  BILIDIR --  IBILI --       ASSESMENT:  1) Pancreatitis from ERCP - still ill but better today, slow improvement clinically with less pain and resolution of nausea and vomiting - still has some ileus but I heard BS today 2) Hypokalemia - resolved 3) Dilated bile duct - 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 PCA 3) Home when tolerating some diet and oral pain meds - explained plan to patient - this will take a few days at least   LOS: 2 days   Stan Head  01/23/2012, 9:00 AM  Pager [phone]

## 2012-01-24 ENCOUNTER — Encounter (HOSPITAL_COMMUNITY): Payer: Self-pay | Admitting: Internal Medicine

## 2012-01-24 LAB — CBC
HCT: 38.3 % (ref 36.0–46.0)
MCH: 29.3 pg (ref 26.0–34.0)
MCV: 83.1 fL (ref 78.0–100.0)
Platelets: 165 10*3/uL (ref 150–400)
RBC: 4.61 MIL/uL (ref 3.87–5.11)
WBC: 16.2 10*3/uL — ABNORMAL HIGH (ref 4.0–10.5)

## 2012-01-24 LAB — COMPREHENSIVE METABOLIC PANEL
AST: 59 U/L — ABNORMAL HIGH (ref 0–37)
BUN: 5 mg/dL — ABNORMAL LOW (ref 6–23)
CO2: 24 mEq/L (ref 19–32)
Calcium: 8.3 mg/dL — ABNORMAL LOW (ref 8.4–10.5)
Chloride: 96 mEq/L (ref 96–112)
Creatinine, Ser: 0.53 mg/dL (ref 0.50–1.10)
GFR calc Af Amer: >90 mL/min (ref >90)
GFR calc non Af Amer: >90 mL/min (ref >90)
Glucose, Bld: 151 mg/dL — ABNORMAL HIGH (ref 70–99)
Total Bilirubin: 2.2 mg/dL — ABNORMAL HIGH (ref 0.3–1.2)

## 2012-01-24 LAB — AMYLASE: Amylase: 350 U/L — ABNORMAL HIGH (ref 0–105)

## 2012-01-24 LAB — LIPASE, BLOOD: Lipase: 199 U/L — ABNORMAL HIGH (ref 11–59)

## 2012-01-24 MED ORDER — DEXTROSE-NACL 5-0.9 % IV SOLN
INTRAVENOUS | Status: DC
  Filled 2012-01-24 (×2): qty 1000

## 2012-01-24 MED ORDER — DEXTROSE-NACL 5-0.9 % IV SOLN
INTRAVENOUS | Status: DC
  Administered 2012-01-24 – 2012-01-26 (×5): via INTRAVENOUS
  Administered 2012-01-26: 1000 mL via INTRAVENOUS

## 2012-01-24 NOTE — Care Management Note (Signed)
  Page 1 of 1   01/24/2012     3:55:40 PM   CARE MANAGEMENT NOTE 01/24/2012  Patient:  [name], [name]   Account Number:  [id number]  Date Initiated:  01/24/2012  Documentation initiated by:  Lorenda Ishihara  Subjective/Objective Assessment:   66 yo female admitted with post ERCP pancreatitis. PTA lived at home with family.     Action/Plan:   Anticipated DC Date:  01/26/2012   Anticipated DC Plan:  HOME/SELF CARE      DC Planning Services  CM consult      Choice offered to / List presented to:             Status of service:  In process, will continue to follow Medicare Important Message given?   (If response is "NO", the following Medicare IM given date fields will be blank) Date Medicare IM given:   Date Additional Medicare IM given:    Discharge Disposition:    Per UR Regulation:  Reviewed for med. necessity/level of care/duration of stay  If discussed at Long Length of Stay Meetings, dates discussed:    Comments:

## 2012-01-24 NOTE — Progress Notes (Signed)
Pain is about the same.  Remains diffusely tender on exam.  Lipase and amylase are decreasing.  WBC 16.2.  Na 128  Imp - post ERCP pancreatits.  Clinically stable.  Note mild hyponatremia.  Plan to continue pain meds, change IV to D5NS, bowel rest.

## 2012-01-24 NOTE — Progress Notes (Signed)
[facility] Progress Note  Subjective:  No further improvement in abdominal pain today.  No nausea or vomiting.    Objective:  Vital signs in last 24 hours: Temp:  [97.9 F (36.6 C)-99 F (37.2 C)] 98 F (36.7 C) (10/07 0500) Pulse Rate:  [84-105] 105  (10/07 0500) Resp:  [16] 16  (10/07 0500) BP: (105-148)/(63-81) 146/79 mmHg (10/07 0500) SpO2:  [90 %-91 %] 90 % (10/07 0500) Last BM Date: 01/21/12 General:   Alert, thin, in NAD Heart:  Tachy, but regular rhythm; no murmurs Pulm:  CTAB.  No W/R/R. Abdomen:  Soft, nondistended. Active bowel sounds.  Diffuse TTP>epigastrium.   Extremities:  Without edema. Neurologic:  Alert and  oriented x4;  grossly normal neurologically. Psych:  Alert and cooperative. Normal mood and affect.  Intake/Output from previous day: 10/06 0701 - 10/07 0700 In: 2649.2 [P.O.:180; I.V.:2469.2] Out: 2250 [Urine:2250]  Lab Results:  Lipase 199 and Amylase 350  Basename 01/24/12 0405 01/23/12 0425 01/22/12 1010  WBC 16.2* 11.9* 13.4*  HGB 13.5 13.2 13.3  HCT 38.3 38.5 37.4  PLT 165 162 178   BMET  Basename 01/24/12 0405 01/23/12 0425 01/22/12 1010  NA 128* 133* 135  K 4.4 4.4 3.3*  CL 96 101 103  CO2 24 24 22   GLUCOSE 151* 138* 177*  BUN 5* 9 10  CREATININE 0.53 0.60 0.53  CALCIUM 8.3* 8.5 8.6   LFT  Basename 01/24/12 0405  PROT 5.9*  ALBUMIN 2.9*  AST 59*  ALT 131*  ALKPHOS 152*  BILITOT 2.2*  BILIDIR --  IBILI --   Assessment / Plan: 1) Pancreatitis from ERCP - no further improvement of abdominal pain today, resolution of nausea and vomiting.  Amylase and lipase trending down. 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 *Continue NPO, pain control. *Recheck CBC and  CMP in AM.   LOS: 3 days   Dawn Rudy D.  01/24/2012, 8:49 AM  Pager number [phone]

## 2012-01-25 LAB — CBC
HCT: 34.1 % — ABNORMAL LOW (ref 36.0–46.0)
MCHC: 35.5 g/dL (ref 30.0–36.0)
Platelets: 151 10*3/uL (ref 150–400)
RDW: 12.3 % (ref 11.5–15.5)
WBC: 12.4 10*3/uL — ABNORMAL HIGH (ref 4.0–10.5)

## 2012-01-25 LAB — COMPREHENSIVE METABOLIC PANEL
AST: 19 U/L (ref 0–37)
Albumin: 2.5 g/dL — ABNORMAL LOW (ref 3.5–5.2)
Alkaline Phosphatase: 136 U/L — ABNORMAL HIGH (ref 39–117)
BUN: 5 mg/dL — ABNORMAL LOW (ref 6–23)
Chloride: 96 mEq/L (ref 96–112)
Potassium: 3.5 mEq/L (ref 3.5–5.1)
Sodium: 129 mEq/L — ABNORMAL LOW (ref 135–145)
Total Bilirubin: 1.2 mg/dL (ref 0.3–1.2)
Total Protein: 5.7 g/dL — ABNORMAL LOW (ref 6.0–8.3)

## 2012-01-25 NOTE — Progress Notes (Signed)
[facility] Progress Note  Subjective: *Abdominal pain unchanged.  Vomited at least twice.**  Objective:  Vital signs in last 24 hours: Temp:  [98.2 F (36.8 C)-98.7 F (37.1 C)] 98.2 F (36.8 C) (10/08 0555) Pulse Rate:  [98-102] 98  (10/08 0555) Resp:  [17-18] 17  (10/08 0555) BP: (121-137)/(71-79) 137/79 mmHg (10/08 0555) SpO2:  [92 %] 92 % (10/08 0555) Last BM Date: 01/21/12 General:   Alert,  Well-developed,  white female in NAD Heart:  Regular rate and rhythm; no murmurs Abdomen:  Absent bowel sounds.  Moderately diffusely tender, tympanitic Neurologic:  Alert and  oriented x4;  grossly normal neurologically. Psych:  Alert and cooperative. Normal mood and affect.  Intake/Output from previous day: 10/07 0701 - 10/08 0700 In: 2435 [I.V.:2435] Out: 2825 [Urine:2825] Intake/Output this shift:    Lab Results:  Basename 01/25/12 0350 01/24/12 0405 01/23/12 0425  WBC 12.4* 16.2* 11.9*  HGB 12.1 13.5 13.2  HCT 34.1* 38.3 38.5  PLT 151 165 162   BMET  Basename 01/25/12 0350 01/24/12 0405 01/23/12 0425  NA 129* 128* 133*  K 3.5 4.4 4.4  CL 96 96 101  CO2 24 24 24   GLUCOSE 165* 151* 138*  BUN 5* 5* 9  CREATININE 0.54 0.53 0.60  CALCIUM 8.1* 8.3* 8.5   LFT  Basename 01/25/12 0350  PROT 5.7*  ALBUMIN 2.5*  AST 19  ALT 79*  ALKPHOS 136*  BILITOT 1.2  BILIDIR --  IBILI --   PT/INR No results found for this basename: LABPROT:2,INR:2 in the last 72 hours Hepatitis Panel No results found for this basename: HEPBSAG,HCVAB,HEPAIGM,HEPBIGM in the last 72 hours  Studies/Results: No results found.   Assessment / Plan: 1.  Ongoing post ERCP pancreatitis - clinically about the same.  Plan to continue bowel rest, antiemetics, analgesics.  2.  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

## 2012-01-26 NOTE — Progress Notes (Signed)
Patient ID: [name], female   DOB: July 30, 1945, 66 y.o.   MRN: [id number]  Gastroenterology Progress Note  Subjective: Feeling overall better- no nausea or vomiting. Pain 7/10- has not tried to eat or drink as yet.  Objective:  Vital signs in last 24 hours: Temp:  [98.4 F (36.9 C)-99.5 F (37.5 C)] 98.4 F (36.9 C) (10/09 0458) Pulse Rate:  [94-97] 94  (10/09 0458) Resp:  [16-20] 16  (10/09 0458) BP: (130-140)/(54-64) 140/64 mmHg (10/09 0458) SpO2:  [90 %-93 %] 93 % (10/09 0458) Weight:  [106 lb 0.7 oz (48.1 kg)] 106 lb 0.7 oz (48.1 kg) (10/08 1518) Last BM Date: 01/25/12 General:   Alert,  Well-developed,    in NAD Heart:  Regular rate and rhythm; no murmurs Pulm;clear Abdomen:  Soft, mildly tender and nondistended. Normal bowel soundsExtremities:  Without edema. Neurologic:  Alert and  oriented x4;  grossly normal neurologically. Psych:  Alert and cooperative. Normal mood and affect.  Intake/Output from previous day: 10/08 0701 - 10/09 0700 In: -  Out: 650 [Urine:650] Intake/Output this shift:    Lab Results: no lipase today  Basename 01/25/12 0350 01/24/12 0405  WBC 12.4* 16.2*  HGB 12.1 13.5  HCT 34.1* 38.3  PLT 151 165   BMET  Basename 01/25/12 0350 01/24/12 0405  NA 129* 128*  K 3.5 4.4  CL 96 96  CO2 24 24  GLUCOSE 165* 151*  BUN 5* 5*  CREATININE 0.54 0.53  CALCIUM 8.1* 8.3*   LFT  Basename 01/25/12 0350  PROT 5.7*  ALBUMIN 2.5*  AST 19  ALT 79*  ALKPHOS 136*  BILITOT 1.2  BILIDIR --  IBILI --      Assessment / Plan: #1 66 yo female with acute post ERCP Pancreatitis,she is stable and improving. Start clear liquids today,advance to low fat full liquids as tolerates. Follow up labs in am Active Problems:  Dilated extrahepatic bile ducts  Acute pancreatitis after ERCP      LOS: 5 days   Amy Esterwood  01/26/2012, 10:57 AM

## 2012-01-27 LAB — BASIC METABOLIC PANEL
CO2: 26 mEq/L (ref 19–32)
Chloride: 98 mEq/L (ref 96–112)
Creatinine, Ser: 0.48 mg/dL — ABNORMAL LOW (ref 0.50–1.10)
Glucose, Bld: 154 mg/dL — ABNORMAL HIGH (ref 70–99)

## 2012-01-27 LAB — LIPASE, BLOOD: Lipase: 39 U/L (ref 11–59)

## 2012-01-27 MED ORDER — PANTOPRAZOLE SODIUM 40 MG PO TBEC
40.0000 mg | DELAYED_RELEASE_TABLET | Freq: Every day | ORAL | Status: DC
  Administered 2012-01-27 – 2012-01-29 (×3): 40 mg via ORAL
  Filled 2012-01-27 (×3): qty 1

## 2012-01-27 MED ORDER — HYDROMORPHONE HCL PF 1 MG/ML IJ SOLN
0.5000 mg | INTRAMUSCULAR | Status: DC | PRN
  Administered 2012-01-27 – 2012-01-29 (×5): 0.5 mg via INTRAVENOUS
  Filled 2012-01-27 (×5): qty 1

## 2012-01-27 MED ORDER — KCL IN DEXTROSE-NACL 20-5-0.9 MEQ/L-%-% IV SOLN
INTRAVENOUS | Status: DC
  Administered 2012-01-27 – 2012-01-28 (×5): via INTRAVENOUS
  Administered 2012-01-29: 75 mL/h via INTRAVENOUS
  Filled 2012-01-27 (×6): qty 1000

## 2012-01-27 MED ORDER — OXYCODONE-ACETAMINOPHEN 5-325 MG PO TABS
1.0000 | ORAL_TABLET | ORAL | Status: DC | PRN
  Administered 2012-01-27 – 2012-01-29 (×9): 1 via ORAL
  Filled 2012-01-27 (×10): qty 1

## 2012-01-27 MED ORDER — POTASSIUM CHLORIDE 10 MEQ/100ML IV SOLN
10.0000 meq | INTRAVENOUS | Status: AC
  Administered 2012-01-27 (×2): 10 meq via INTRAVENOUS
  Filled 2012-01-27 (×2): qty 100

## 2012-01-27 MED ORDER — POTASSIUM CHLORIDE CRYS ER 20 MEQ PO TBCR
20.0000 meq | EXTENDED_RELEASE_TABLET | Freq: Two times a day (BID) | ORAL | Status: DC
  Administered 2012-01-27 – 2012-01-29 (×5): 20 meq via ORAL
  Filled 2012-01-27 (×6): qty 1

## 2012-01-27 NOTE — Progress Notes (Signed)
Patient ID: [name], female   DOB: 21-Jun-1945, 66 y.o.   MRN: [id number] [facility] Progress Note  Subjective: Feeling better-still some pain, tolerating liquids  Objective:  Vital signs in last 24 hours: Temp:  [98.3 F (36.8 C)-99.2 F (37.3 C)] 98.3 F (36.8 C) (10/10 0552) Pulse Rate:  [84-96] 84  (10/10 0552) Resp:  [16-20] 16  (10/10 0552) BP: (112-143)/(57-62) 141/60 mmHg (10/10 0552) SpO2:  [94 %-98 %] 96 % (10/10 0552) Last BM Date: 01/25/12 General:   Alert,  Well-developed,    in NAD Heart:  Regular rate and rhythm; no murmurs Pulm;clear Abdomen:  Soft, tender and nondistended. Normal bowel sounds, without guarding,   Extremities:  Without edema. Neurologic:  Alert and  oriented x4;  grossly normal neurologically. Psych:  Alert and cooperative. Normal mood and affect.  Intake/Output from previous day: 10/09 0701 - 10/10 0700 In: 680 [P.O.:25; I.V.:655] Out: 1600 [Urine:1600] Intake/Output this shift:    Lab Results:  Lipase=39  Basename 01/25/12 0350  WBC 12.4*  HGB 12.1  HCT 34.1*  PLT 151   BMET  Basename 01/27/12 0335 01/25/12 0350  NA 133* 129*  K 2.4* 3.5  CL 98 96  CO2 26 24  GLUCOSE 154* 165*  BUN 3* 5*  CREATININE 0.48* 0.54  CALCIUM 8.2* 8.1*   LFT  Basename 01/25/12 0350  PROT 5.7*  ALBUMIN 2.5*  AST 19  ALT 79*  ALKPHOS 136*  BILITOT 1.2  BILIDIR --  IBILI --    Assessment / Plan: #1 66 yo female with post ERCP pancreatitis- iimproving Will try oral pain meds, and advance diet today Plan for discharge home tomorrow #2 Hypokalemia- correcting #3 hx of gastric GIST-s/p resection Active Problems:  Dilated extrahepatic bile ducts  Acute pancreatitis after ERCP      LOS: 6 days   Amy Esterwood  01/27/2012, 10:53 AM

## 2012-01-27 NOTE — Progress Notes (Signed)
This patient is receiving IV Protonix. Based on criteria approved by the Pharmacy and Therapeutics Committee, this medication is being converted to the equivalent oral dose form. These criteria include:   . The patient is eating (either orally or per tube) and/or has been taking other orally administered medications for at least 24 hours.  . This patient has no evidence of active gastrointestinal bleeding or impaired GI absorption (gastrectomy, short bowel, patient on TNA or NPO).   If you have questions about this conversion, please contact the pharmacy department.  [name], [place] 01/27/2012 2:09 PM

## 2012-01-27 NOTE — Progress Notes (Signed)
CRITICAL VALUE ALERT  Critical value received:  K of 2.4  Date of notification: 01/27/12  Time of notification:  0442  Critical value read back:yes  Nurse who received alert:  Cynda Familia, RN  MD notified (1st page):  Dr. Christella Hartigan  Time of first page:  0448  MD notified (2nd page):  Time of second page:  Responding MD:  Dr. Christella Hartigan  Time MD responded:  [phone], orders given for two runs of k, as well as of k added to IV fluid.

## 2012-01-28 LAB — BASIC METABOLIC PANEL
BUN: 3 mg/dL — ABNORMAL LOW (ref 6–23)
CO2: 26 mEq/L (ref 19–32)
Calcium: 8.6 mg/dL (ref 8.4–10.5)
Glucose, Bld: 132 mg/dL — ABNORMAL HIGH (ref 70–99)
Potassium: 3.5 mEq/L (ref 3.5–5.1)
Sodium: 136 mEq/L (ref 135–145)

## 2012-01-28 MED ORDER — ONDANSETRON HCL 4 MG/2ML IJ SOLN: 4.0000 mg | Freq: Two times a day (BID) | INTRAMUSCULAR | Status: DC

## 2012-01-28 MED ORDER — ONDANSETRON HCL 8 MG PO TABS
8.0000 mg | ORAL_TABLET | Freq: Two times a day (BID) | ORAL | Status: DC
  Administered 2012-01-28 – 2012-01-29 (×2): 8 mg via ORAL
  Filled 2012-01-28 (×3): qty 1

## 2012-01-28 NOTE — Progress Notes (Signed)
Patient ID: [name], female   DOB: Apr 30, 1945, 66 y.o.   MRN: [id number] [facility] Progress Note  Subjective: Does not feel well today- says she vomitied last night, and is tired today. Did not eat this am, still having some abdominal pain. Says pain med helps but doesn't last very long  Objective:  Vital signs in last 24 hours: Temp:  [98.2 F (36.8 C)-98.5 F (36.9 C)] 98.5 F (36.9 C) (10/11 0550) Pulse Rate:  [73-84] 84  (10/11 0550) Resp:  [16-20] 20  (10/11 0550) BP: (113-125)/(57-61) 125/61 mmHg (10/11 0550) SpO2:  [97 %-98 %] 98 % (10/11 0550) Last BM Date: 01/25/12 General:   Alert,  Well-developed,thin    in NAD Heart:  Regular rate and rhythm; no murmurs Pulm;clear Abdomen:  Soft, tender across upper abdomen, no guarding , bs+ Extremities:  Without edema. Neurologic:  Alert and  oriented x4;  grossly normal neurologically. Psych:  Alert and cooperative. Normal mood and affect.  Intake/Output from previous day: 10/10 0701 - 10/11 0700 In: 1207 [P.O.:480; I.V.:727] Out: 1250 [Urine:1250] Intake/Output this shift:    BMET  Basename 01/28/12 0345 01/27/12 0335  NA 136 133*  K 3.5 2.4*  CL 102 98  CO2 26 26  GLUCOSE 132* 154*  BUN 3* 3*  CREATININE 0.52 0.48*  CALCIUM 8.6 8.2*     Assessment / Plan: 66 yo female with post ERCP pancreatitis- slow to improve, parameter stable Will keep her today, continue Percocet, add oral Zofran Continue soft, low fat diet Hopefully home in am #2 hypokalemia-corrected. Active Problems:  Dilated extrahepatic bile ducts  Acute pancreatitis after ERCP      LOS: 7 days   Amy Esterwood  01/28/2012, 11:18 AM

## 2012-01-29 MED ORDER — PANTOPRAZOLE SODIUM 40 MG PO TBEC: 40.0000 mg | DELAYED_RELEASE_TABLET | Freq: Every day | ORAL | Status: DC

## 2012-01-29 MED ORDER — HYDROCODONE-ACETAMINOPHEN 5-500 MG PO TABS: 1.0000 | ORAL_TABLET | ORAL | Status: DC | PRN

## 2012-01-29 NOTE — Progress Notes (Signed)
Patient ID: [name], female   DOB: Dec 30, 1945, 66 y.o.   MRN: [id number]  [facility] Progress Note  Subjective: Feels OK - still nauseated intermittently and vomited again last night. She feels she can manage at home though, pain has decreased.  Objective:  Vital signs in last 24 hours: Temp:  [98 F (36.7 C)-98.3 F (36.8 C)] 98.2 F (36.8 C) (10/12 0500) Pulse Rate:  [78-94] 78  (10/12 0500) Resp:  [20-22] 20  (10/12 0500) BP: (127-131)/(62-68) 131/68 mmHg (10/12 0500) SpO2:  [98 %-100 %] 98 % (10/12 0500) Last BM Date: 01/27/12 General:   Alert, well-developed, female in NAD Heart:  Regular rate and rhythm; no murmurs Pulm;clear Abdomen:  Soft, nontender and nondistended. Normal bowel sounds, without guarding, and without rebound.   Extremities:  Without edema. Neurologic:  Alert and  oriented x4;  grossly normal neurologically. Psych:  Alert and cooperative. Normal mood and affect.  Intake/Output from previous day: 10/11 0701 - 10/12 0700 In: 150 [I.V.:150] Out: 250 [Urine:250] Intake/Output this shift:   Lab Results: No results found for this basename: WBC:3,HGB:3,HCT:3,PLT:3 in the last 72 hours BMET  [facility] 01/28/12 0345 01/27/12 0335  NA 136 133*  K 3.5 2.4*  CL 102 98  CO2 26 26  GLUCOSE 132* 154*  BUN 3* 3*  CREATININE 0.52 0.48*  CALCIUM 8.6 8.2*    Assessment / Plan: Resolving post ERCP pancreatitis Home today  Low fat diet, frequent small feedings Will arrange outpt follow up with Dr. Leone Payor Vicodin 5/500  Q 4-6 hours prn pain Pt has Zofran at home for PRN use  Active Problems:  Dilated extrahepatic bile ducts  Acute pancreatitis after ERCP    LOS: 8 days   Amy Esterwood  01/29/2012, 9:16 AM   I have taken an interval history, reviewed the chart and examined the patient. I agree with the extender's note, impression and recommendations. Improving slowly. OK for discharge and outpatient follow up with Dr.  Leone Payor.  Venita Lick. Russella Dar MD [facility]

## 2012-01-29 NOTE — Discharge Summary (Signed)
[facility] Discharge Summary  Name: [name] MRN: [id number] DOB: [birth year] 66 y.o. PCP:  Dawn [place], [name]  Date of Admission: 01/21/2012  9:13 AM Date of Discharge: 01/29/2012 Attending Physician: Dawn Boop, [name]  Discharge Diagnosis: #1 66 yo female with post ERCP pancreatitis-post ERCP and sphincterotomy on day of admission done fro dilated CBD HX of gastric GIST- s/p resection Consultations: none Procedures Performed:  Dg Ercp With Sphincterotomy  01/21/2012  *RADIOLOGY REPORT*  Clinical Data: Dilated bile duct, upper abdominal pain, history of GIST  ERCP  Comparison:  MRCP - 12/08/2011; abdominal CT - 11/30/2011  Findings:  Eight spot fluoroscopic intraoperative images during ERCP are provided for review.  Initial spot radiographic images demonstrates an ERCP probe tip overlying the right upper abdominal quadrant.  Cholecystectomy clips are seen overlying the expected location of the gallbladder fossa.  Subsequent images demonstrates selective cannulation of the common bile duct.  Contrast injection redemonstrates mild dilatation of the CBD. There is mild dilatation of the central intrahepatic biliary system.  No significant peripheral biliary ductal dilatation.  No discrete filling defect is seen within the opacified portions of the biliary tree.  Subsequent images demonstrate insufflation of a balloon within the proximal common hepatic duct.  Final image demonstrates the ERCP probe overlying the expected location of the distal esophagus with residual contrast in a standing column within the dilated CBD, nonspecific but suggestive of stricture or spasm.  IMPRESSION: ERCP as above.   Original Report Authenticated By: [name], M.D.     GI Procedures: none  History/Physical Exam:  See Admission H&P  Admission HPI: Pt was admitted with acute severe abdominal pain post ERCP on 01/21/12. Labs were consistent with pancreatitis. She had a benign hospital  course with rapid normalization of lipase, and no evidence for SIRS. She has some persistent pain and nausea. Able to slowly advance diet and convert to oral meds. Hypokalemia was corrected Stable for discharge home on 01/29/12 Low fat diet Follow up in office with Dr. Leone [name]   Discharge Vitals:  BP 131/68  Pulse 78  Temp 98.2 F (36.8 C) (Oral)  Resp 20  Ht 5\' 2"  (1.575 m)  Wt 106 lb 0.7 oz (48.1 kg)  BMI 19.40 kg/m2  SpO2 98%  Discharge Labs: No results found for this or any previous visit (from the past 24 hour(s)).  Disposition and follow-up:   [name] was discharged from [facility] in stable condition.    Follow-up Appointments: Discharge Orders    Future Appointments: Provider: Department: Dept Phone: Center:   08/29/2012 10:00 AM [name] [facility] [phone] None   09/05/2012 9:00 AM [name] [facility] [phone] None      Discharge Medications:   Medication List     As of 01/29/2012  9:25 AM    TAKE these medications         amLODipine 10 MG tablet   Commonly known as: NORVASC   Take 10 mg by mouth daily.      HYDROcodone-acetaminophen 5-500 MG per tablet   Commonly known as: VICODIN   Take 1 tablet by mouth every 4 (four) hours as needed for pain.      pantoprazole 40 MG tablet   Commonly known as: PROTONIX   Take 1 tablet (40 mg total) by mouth daily.      promethazine 12.5 MG tablet   Commonly known as: PHENERGAN   Take 12.5 mg by mouth every  6 (six) hours as needed. nausea       Signed: Mike [name] 01/29/2012, 9:25 AM      Dawn Head, [name] [facility]

## 2012-03-14 ENCOUNTER — Other Ambulatory Visit (INDEPENDENT_AMBULATORY_CARE_PROVIDER_SITE_OTHER): Payer: Medicaid Other

## 2012-03-14 ENCOUNTER — Encounter: Payer: Self-pay | Admitting: Internal Medicine

## 2012-03-14 ENCOUNTER — Ambulatory Visit (INDEPENDENT_AMBULATORY_CARE_PROVIDER_SITE_OTHER): Payer: Medicaid Other | Admitting: Internal Medicine

## 2012-03-14 VITALS — BP 110/68 | HR 101 | Ht 62.0 in | Wt 94.2 lb

## 2012-03-14 DIAGNOSIS — R634 Abnormal weight loss: Secondary | ICD-10-CM

## 2012-03-14 DIAGNOSIS — R1013 Epigastric pain: Secondary | ICD-10-CM

## 2012-03-14 DIAGNOSIS — K859 Acute pancreatitis without necrosis or infection, unspecified: Secondary | ICD-10-CM

## 2012-03-14 DIAGNOSIS — K838 Other specified diseases of biliary tract: Secondary | ICD-10-CM

## 2012-03-14 LAB — COMPREHENSIVE METABOLIC PANEL
Alkaline Phosphatase: 119 U/L — ABNORMAL HIGH (ref 39–117)
BUN: 15 mg/dL (ref 6–23)
CO2: 25 mEq/L (ref 19–32)
GFR: 82.4 mL/min (ref >60.00)
Glucose, Bld: 133 mg/dL — ABNORMAL HIGH (ref 70–99)
Total Bilirubin: 0.8 mg/dL (ref 0.3–1.2)

## 2012-03-14 MED ORDER — HYDROCODONE-ACETAMINOPHEN 5-500 MG PO TABS: 1.0000 | ORAL_TABLET | ORAL | Status: DC | PRN

## 2012-03-14 MED ORDER — PANTOPRAZOLE SODIUM 40 MG PO TBEC: 40.0000 mg | DELAYED_RELEASE_TABLET | Freq: Every day | ORAL | Status: DC

## 2012-03-14 MED ORDER — AMITRIPTYLINE HCL 25 MG PO TABS: 25.0000 mg | ORAL_TABLET | Freq: Every day | ORAL | Status: DC

## 2012-03-14 NOTE — Patient Instructions (Addendum)
Your physician has requested that you go to the basement for lab work before leaving today.  We have sent the following medications to your pharmacy for you to pick up at your convenience: Vicodin, Amitriptyline, Pantoprazole    You have been scheduled for a CT scan of the abdomen and pelvis at [facility] CT ([address]---this is in the same building as Architectural [facility]).   You are scheduled on 03/22/12 at 10:00am. You should arrive 15 minutes prior to your appointment time for registration. Please follow the written instructions below on the day of your exam:  WARNING: IF YOU ARE ALLERGIC TO IODINE/X-RAY DYE, PLEASE NOTIFY RADIOLOGY IMMEDIATELY AT [phone]! YOU WILL BE GIVEN A 13 HOUR PREMEDICATION PREP.  1) Do not eat or drink anything after 6:00am (4 hours prior to your test) 2) You have been given 2 bottles of oral contrast to drink. The solution may taste  better if refrigerated, but do NOT add ice or any other liquid to this solution. Shake well before drinking.    Drink 1 bottle of contrast @ 8:00am (2 hours prior to your exam)  Drink 1 bottle of contrast @ 9:00am (1 hour prior to your exam)  You may take any medications as prescribed with a small amount of water except for the following: Metformin, Glucophage, Glucovance, Avandamet, Riomet, Fortamet, Actoplus Met, Janumet, Glumetza or Metaglip. The above medications must be held the day of the exam AND 48 hours after the exam.  The purpose of you drinking the oral contrast is to aid in the visualization of your intestinal tract. The contrast solution may cause some diarrhea. Before your exam is started, you will be given a small amount of fluid to drink. Depending on your individual set of symptoms, you may also receive an intravenous injection of x-ray contrast/dye. Plan on being at [facility] for 30 minutes or long, depending on the type of exam you are having performed.  This test typically takes 30-45  minutes to complete.  If you have any questions regarding your exam or if you need to reschedule, you may call the CT department at [phone] between the hours of 8:00 am and 5:00 pm, Monday-Friday.    Thank you for choosing me and [facility].  [name], M.D., [facility]

## 2012-03-14 NOTE — Progress Notes (Signed)
  Subjective:    Patient ID: [name], female    DOB: 22-Jan-1946, 66 y.o.   MRN: [id number]  HPI The patient returns with an interpreter. She underwent ERCP with sphincterotomy for dilated bile ducts and inspected papillary stenosis. She suffered post ERCP pancreatitis. She remained in the hospital for about a week last month. She returns today, indicating that she still has sharp and burning epigastric pain radiating into the back. She has had some nausea and vomiting. She has lost weight. She is out of her hydrocodone and out of her pantoprazole. No fevers reported. She does report some early satiety. 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  Medications, allergies, past medical history, past surgical history, family history and social history are reviewed and updated in the EMR.  Review of Systems As above    Objective:   Physical Exam General:  NAD Eyes:   anicteric Lungs:  clear Heart:  S1S2 no rubs, murmurs or gallops Abdomen:  soft and tender in upper abdomen, no masses, BS+ Ext:   no edema    Data Reviewed:  Last hospital admit, labs Wt Readings from Last 3 Encounters:  03/14/12 94 lb 3.2 oz (42.729 kg)  01/25/12 106 lb 0.7 oz (48.1 kg)  01/25/12 106 lb 0.7 oz (48.1 kg)       Assessment & Plan:   1. Epigastric pain  Amylase, Lipase, Comp Met (CMET)  2. Dilated extrahepatic bile ducts    3. Weight loss  Amylase, Lipase, Comp Met (CMET)  4. Acute pancreatitis- after ERCP  Amylase, Lipase, Comp Met (CMET), CT Abdomen W Contrast   1. ? If this is her chronic pain or related to pancreatitis 2. CT abdomen with contrast to look for complications of post-ercp pancreatitis 3. Labs 4. Refill pantoprazole and hydrocodone-APAP 5. Restart amitriptyline which had helped her chronic epigastric pain (started after GIST resection) in the past 6. REV 6 weeks

## 2012-03-22 ENCOUNTER — Ambulatory Visit (INDEPENDENT_AMBULATORY_CARE_PROVIDER_SITE_OTHER)
Admission: RE | Admit: 2012-03-22 | Discharge: 2012-03-22 | Disposition: A | Discharge status: Home / Self Care | Payer: Medicaid Other | Source: Ambulatory Visit | Attending: Internal Medicine | Admitting: Internal Medicine

## 2012-03-22 DIAGNOSIS — K859 Acute pancreatitis without necrosis or infection, unspecified: Secondary | ICD-10-CM

## 2012-03-22 MED ORDER — IOHEXOL 300 MG/ML  SOLN
100.0000 mL | Freq: Once | INTRAMUSCULAR | Status: AC | PRN
  Administered 2012-03-22: 100 mL via INTRAVENOUS

## 2012-03-22 NOTE — Progress Notes (Signed)
Quick Note:  CT shows some residual inflammation  Continue with current treatment Follow-up 6 weeks Send letter if cannot communicate via phone  ______

## 2012-05-04 ENCOUNTER — Encounter: Payer: Self-pay | Admitting: Internal Medicine

## 2012-05-04 ENCOUNTER — Ambulatory Visit (INDEPENDENT_AMBULATORY_CARE_PROVIDER_SITE_OTHER): Payer: Medicaid Other | Admitting: Internal Medicine

## 2012-05-04 VITALS — BP 110/68 | HR 76 | Ht 59.75 in | Wt 99.2 lb

## 2012-05-04 DIAGNOSIS — R1013 Epigastric pain: Secondary | ICD-10-CM

## 2012-05-04 DIAGNOSIS — K859 Acute pancreatitis without necrosis or infection, unspecified: Secondary | ICD-10-CM

## 2012-05-04 DIAGNOSIS — S335XXA Sprain of ligaments of lumbar spine, initial encounter: Secondary | ICD-10-CM

## 2012-05-04 DIAGNOSIS — S39012A Strain of muscle, fascia and tendon of lower back, initial encounter: Secondary | ICD-10-CM

## 2012-05-04 DIAGNOSIS — G8929 Other chronic pain: Secondary | ICD-10-CM

## 2012-05-04 MED ORDER — MELOXICAM 15 MG PO TABS: 7.5000 mg | ORAL_TABLET | Freq: Every day | ORAL | Status: DC

## 2012-05-04 MED ORDER — MELOXICAM 7.5 MG PO TABS: 7.5000 mg | ORAL_TABLET | Freq: Every day | ORAL | Status: AC

## 2012-05-04 NOTE — Progress Notes (Signed)
  Subjective:    Patient ID: [name], female    DOB: [birth year], 67 y.o.   MRN: [id number]  HPI interpreter present with the patient today. Patient presents in followup, she has had acute pancreatitis after ERCP, chronic functional epigastric pain. CT scanning in the interim has demonstrated trace fluid but overall no pancreatitis. I think that has resolved. She is back on amitriptyline and only has epigastric pain every 2-4 days and is overall much better. She is sleeping well also. No nausea or vomiting reported. She is having some left flank and buttock pain that is new in the past week. There does not seem to be any radiation to the leg or leak this. She does not remember injury. It has gotten old and better but is persisted. It is worse with sitting. It radiates around to the left lower quadrant. There is no fever and there are no genitourinary complaints.  Medications, allergies, past medical history, past surgical history, family history and social history are reviewed and updated in the EMR.   Review of Systems See history of present illness    Objective:   Physical Exam Well-developed well-nourished Asian woman in no acute distress He is tender in the left low back and gluteal area. There is mild tenderness and left lower quadrant though it is inconsistent, when she relaxes there is no tenderness. Extremity exam is negative for pain with hip manipulation internal/external rotation, there is no straight leg raise signed. Deep tendon reflexes are 1+ on the right knee and 2+ on the left. There is no clonus. Lower extremity strength is intact 4r over 4.       Assessment & Plan:   1. Chronic epigastric pain - improved on amitriptyline.   2. Acute pancreatitis - resolved   3. Lumbar strain - new problem    1. Continue amitriptyline 25 mg at bedtime. Somehow she came off of this. This is served her well over the years and is handling and greatly improving her chronic  functional epigastric pain. 2. The pancreatitis is resolved. Note, where that on her CT scan that suggested an MRI be performed. She is well out from her gist tumor, and I don't think she needs further imaging and these areas at this time given the overall clinical scenario. 3. Meloxicam 7.5 mg daily for the lumbar strain, local measures with heat, handout provided as well. 4. If her lumbar strain fails to improve she should go to her primary care.  CC: Lillia Mountain, MD

## 2012-05-04 NOTE — Patient Instructions (Addendum)
We have sent the following medications to your pharmacy for you to pick up at your convenience: Meloxicam.  This is for your lumbar strain.  Take this for two weeks on a regular basis and if better you may stop it.  Also apply heat to the area as this may help also.    We want to see you back in five months and we will put a note in the computer to notify you.  Low Back Sprain with Rehab  A sprain is an injury in which a ligament is torn. The ligaments of the lower back are vulnerable to sprains. However, they are strong and require great force to be injured. These ligaments are important for stabilizing the spinal column. Sprains are classified into three categories. Grade 1 sprains cause pain, but the tendon is not lengthened. Grade 2 sprains include a lengthened ligament, due to the ligament being stretched or partially ruptured. With grade 2 sprains there is still function, although the function may be decreased. Grade 3 sprains involve a complete tear of the tendon or muscle, and function is usually impaired. SYMPTOMS   Severe pain in the lower back.  Sometimes, a feeling of a "pop," "snap," or tear, at the time of injury.  Tenderness and sometimes swelling at the injury site.  Uncommonly, bruising (contusion) within 48 hours of injury.  Muscle spasms in the back. CAUSES  Low back sprains occur when a force is placed on the ligaments that is greater than they can handle. Common causes of injury include:  Performing a stressful act while off-balance.  Repetitive stressful activities that involve movement of the lower back.  Direct hit (trauma) to the lower back.  Previous back injury or surgery (especially fusion). PREVENTION  Maintain physical fitness:  Strength, flexibility, and endurance.  Cardiovascular fitness.  Maintain a healthy body weight. PROGNOSIS  If treated properly, low back sprains usually heal with non-surgical treatment. The length of time for healing  depends on the severity of the injury.  RELATED COMPLICATIONS   Recurring symptoms, resulting in a chronic problem.  Chronic inflammation and pain in the low back.  Delayed healing or resolution of symptoms, especially if activity is resumed too soon.  Prolonged impairment.  Unstable or arthritic joints of the low back. TREATMENT  Treatment first involves the use of ice and medicine, to reduce pain and inflammation. The use of strengthening and stretching exercises may help reduce pain with activity. These exercises may be performed at home or with a therapist. Severe injuries may require referral to a therapist for further evaluation and treatment, such as ultrasound. Your caregiver may advise that you wear a back brace or corset, to help reduce pain and discomfort. Often, prolonged bed rest results in greater harm then benefit. Corticosteroid injections may be recommended. However, these should be reserved for the most serious cases. It is important to avoid using your back when lifting objects. At night, sleep on your back on a firm mattress, with a pillow placed under your knees. If non-surgical treatment is unsuccessful, surgery may be needed.  MEDICATION   If pain medicine is needed, nonsteroidal anti-inflammatory medicines (aspirin and ibuprofen), or other minor pain relievers (acetaminophen), are often advised.  Do not take pain medicine for 7 days before surgery.  Prescription pain relievers may be given, if your caregiver thinks they are needed. Use only as directed and only as much as you need.  Ointments applied to the skin may be helpful.  Corticosteroid injections may  be given by your caregiver. These injections should be reserved for the most serious cases, because they may only be given a certain number of times. HEAT AND COLD  Cold treatment (icing) should be applied for 10 to 15 minutes every 2 to 3 hours for inflammation and pain, and immediately after activity that  aggravates your symptoms. Use ice packs or an ice massage.  Heat treatment may be used before performing stretching and strengthening activities prescribed by your caregiver, physical therapist, or athletic trainer. Use a heat pack or a warm water soak. SEEK MEDICAL CARE IF:   Symptoms get worse or do not improve in 2 to 4 weeks, despite treatment.  You develop numbness or weakness in either leg.  You lose bowel or bladder function.  Any of the following occur after surgery: fever, increased pain, swelling, redness, drainage of fluids, or bleeding in the affected area.  New, unexplained symptoms develop. (Drugs used in treatment may produce side effects.) EXERCISES  RANGE OF MOTION (ROM) AND STRETCHING EXERCISES - Low Back Sprain Most people with lower back pain will find that their symptoms get worse with excessive bending forward (flexion) or arching at the lower back (extension). The exercises that will help resolve your symptoms will focus on the opposite motion.  Your physician, physical therapist or athletic trainer will help you determine which exercises will be most helpful to resolve your lower back pain. Do not complete any exercises without first consulting with your caregiver. Discontinue any exercises which make your symptoms worse, until you speak to your caregiver. If you have pain, numbness or tingling which travels down into your buttocks, leg or foot, the goal of the therapy is for these symptoms to move closer to your back and eventually resolve. Sometimes, these leg symptoms will get better, but your lower back pain may worsen. This is often an indication of progress in your rehabilitation. Be very alert to any changes in your symptoms and the activities in which you participated in the 24 hours prior to the change. Sharing this information with your caregiver will allow him or her to most efficiently treat your condition. These exercises may help you when beginning to  rehabilitate your injury. Your symptoms may resolve with or without further involvement from your physician, physical therapist or athletic trainer. While completing these exercises, remember:   Restoring tissue flexibility helps normal motion to return to the joints. This allows healthier, less painful movement and activity.  An effective stretch should be held for at least 30 seconds.  A stretch should never be painful. You should only feel a gentle lengthening or release in the stretched tissue. FLEXION RANGE OF MOTION AND STRETCHING EXERCISES: STRETCH  Flexion, Single Knee to Chest   Lie on a firm bed or floor with both legs extended in front of you.  Keeping one leg in contact with the floor, bring your opposite knee to your chest. Hold your leg in place by either grabbing behind your thigh or at your knee.  Pull until you feel a gentle stretch in your low back. Hold __________ seconds.  Slowly release your grasp and repeat the exercise with the opposite side. Repeat __________ times. Complete this exercise __________ times per day.  STRETCH  Flexion, Double Knee to Chest  Lie on a firm bed or floor with both legs extended in front of you.  Keeping one leg in contact with the floor, bring your opposite knee to your chest.  Tense your stomach muscles to  support your back and then lift your other knee to your chest. Hold your legs in place by either grabbing behind your thighs or at your knees.  Pull both knees toward your chest until you feel a gentle stretch in your low back. Hold __________ seconds.  Tense your stomach muscles and slowly return one leg at a time to the floor. Repeat __________ times. Complete this exercise __________ times per day.  STRETCH  Low Trunk Rotation  Lie on a firm bed or floor. Keeping your legs in front of you, bend your knees so they are both pointed toward the ceiling and your feet are flat on the floor.  Extend your arms out to the side. This  will stabilize your upper body by keeping your shoulders in contact with the floor.  Gently and slowly drop both knees together to one side until you feel a gentle stretch in your low back. Hold for __________ seconds.  Tense your stomach muscles to support your lower back as you bring your knees back to the starting position. Repeat the exercise to the other side. Repeat __________ times. Complete this exercise __________ times per day  EXTENSION RANGE OF MOTION AND FLEXIBILITY EXERCISES: STRETCH  Extension, Prone on Elbows   Lie on your stomach on the floor, a bed will be too soft. Place your palms about shoulder width apart and at the height of your head.  Place your elbows under your shoulders. If this is too painful, stack pillows under your chest.  Allow your body to relax so that your hips drop lower and make contact more completely with the floor.  Hold this position for __________ seconds.  Slowly return to lying flat on the floor. Repeat __________ times. Complete this exercise __________ times per day.  RANGE OF MOTION  Extension, Prone Press Ups  Lie on your stomach on the floor, a bed will be too soft. Place your palms about shoulder width apart and at the height of your head.  Keeping your back as relaxed as possible, slowly straighten your elbows while keeping your hips on the floor. You may adjust the placement of your hands to maximize your comfort. As you gain motion, your hands will come more underneath your shoulders.  Hold this position __________ seconds.  Slowly return to lying flat on the floor. Repeat __________ times. Complete this exercise __________ times per day.  RANGE OF MOTION- Quadruped, Neutral Spine   Assume a hands and knees position on a firm surface. Keep your hands under your shoulders and your knees under your hips. You may place padding under your knees for comfort.  Drop your head and point your tailbone toward the ground below you. This will  round out your lower back like an angry cat. Hold this position for __________ seconds.  Slowly lift your head and release your tail bone so that your back sags into a large arch, like an old horse.  Hold this position for __________ seconds.  Repeat this until you feel limber in your low back.  Now, find your "sweet spot." This will be the most comfortable position somewhere between the two previous positions. This is your neutral spine. Once you have found this position, tense your stomach muscles to support your low back.  Hold this position for __________ seconds. Repeat __________ times. Complete this exercise __________ times per day.  STRENGTHENING EXERCISES - Low Back Sprain These exercises may help you when beginning to rehabilitate your injury. These exercises should be  done near your "sweet spot." This is the neutral, low-back arch, somewhere between fully rounded and fully arched, that is your least painful position. When performed in this safe range of motion, these exercises can be used for people who have either a flexion or extension based injury. These exercises may resolve your symptoms with or without further involvement from your physician, physical therapist or athletic trainer. While completing these exercises, remember:   Muscles can gain both the endurance and the strength needed for everyday activities through controlled exercises.  Complete these exercises as instructed by your physician, physical therapist or athletic trainer. Increase the resistance and repetitions only as guided.  You may experience muscle soreness or fatigue, but the pain or discomfort you are trying to eliminate should never worsen during these exercises. If this pain does worsen, stop and make certain you are following the directions exactly. If the pain is still present after adjustments, discontinue the exercise until you can discuss the trouble with your caregiver. STRENGTHENING Deep Abdominals,  Pelvic Tilt   Lie on a firm bed or floor. Keeping your legs in front of you, bend your knees so they are both pointed toward the ceiling and your feet are flat on the floor.  Tense your lower abdominal muscles to press your low back into the floor. This motion will rotate your pelvis so that your tail bone is scooping upwards rather than pointing at your feet or into the floor. With a gentle tension and even breathing, hold this position for __________ seconds. Repeat __________ times. Complete this exercise __________ times per day.  STRENGTHENING  Abdominals, Crunches   Lie on a firm bed or floor. Keeping your legs in front of you, bend your knees so they are both pointed toward the ceiling and your feet are flat on the floor. Cross your arms over your chest.  Slightly tip your chin down without bending your neck.  Tense your abdominals and slowly lift your trunk high enough to just clear your shoulder blades. Lifting higher can put excessive stress on the lower back and does not further strengthen your abdominal muscles.  Control your return to the starting position. Repeat __________ times. Complete this exercise __________ times per day.  STRENGTHENING  Quadruped, Opposite UE/LE Lift   Assume a hands and knees position on a firm surface. Keep your hands under your shoulders and your knees under your hips. You may place padding under your knees for comfort.  Find your neutral spine and gently tense your abdominal muscles so that you can maintain this position. Your shoulders and hips should form a rectangle that is parallel with the floor and is not twisted.  Keeping your trunk steady, lift your right hand no higher than your shoulder and then your left leg no higher than your hip. Make sure you are not holding your breath. Hold this position for __________ seconds.  Continuing to keep your abdominal muscles tense and your back steady, slowly return to your starting position. Repeat with  the opposite arm and leg. Repeat __________ times. Complete this exercise __________ times per day.  STRENGTHENING  Abdominals and Quadriceps, Straight Leg Raise   Lie on a firm bed or floor with both legs extended in front of you.  Keeping one leg in contact with the floor, bend the other knee so that your foot can rest flat on the floor.  Find your neutral spine, and tense your abdominal muscles to maintain your spinal position throughout the exercise.  Slowly lift your straight leg off the floor about 6 inches for a count of 15, making sure to not hold your breath.  Still keeping your neutral spine, slowly lower your leg all the way to the floor. Repeat this exercise with each leg __________ times. Complete this exercise __________ times per day. POSTURE AND BODY MECHANICS CONSIDERATIONS - Low Back Sprain Keeping correct posture when sitting, standing or completing your activities will reduce the stress put on different body tissues, allowing injured tissues a chance to heal and limiting painful experiences. The following are general guidelines for improved posture. Your physician or physical therapist will provide you with any instructions specific to your needs. While reading these guidelines, remember:  The exercises prescribed by your provider will help you have the flexibility and strength to maintain correct postures.  The correct posture provides the best environment for your joints to work. All of your joints have less wear and tear when properly supported by a spine with good posture. This means you will experience a healthier, less painful body.  Correct posture must be practiced with all of your activities, especially prolonged sitting and standing. Correct posture is as important when doing repetitive low-stress activities (typing) as it is when doing a single heavy-load activity (lifting). RESTING POSITIONS Consider which positions are most painful for you when choosing a  resting position. If you have pain with flexion-based activities (sitting, bending, stooping, squatting), choose a position that allows you to rest in a less flexed posture. You would want to avoid curling into a fetal position on your side. If your pain worsens with extension-based activities (prolonged standing, working overhead), avoid resting in an extended position such as sleeping on your stomach. Most people will find more comfort when they rest with their spine in a more neutral position, neither too rounded nor too arched. Lying on a non-sagging bed on your side with a pillow between your knees, or on your back with a pillow under your knees will often provide some relief. Keep in mind, being in any one position for a prolonged period of time, no matter how correct your posture, can still lead to stiffness. PROPER SITTING POSTURE In order to minimize stress and discomfort on your spine, you must sit with correct posture. Sitting with good posture should be effortless for a healthy body. Returning to good posture is a gradual process. Many people can work toward this most comfortably by using various supports until they have the flexibility and strength to maintain this posture on their own. When sitting with proper posture, your ears will fall over your shoulders and your shoulders will fall over your hips. You should use the back of the chair to support your upper back. Your lower back will be in a neutral position, just slightly arched. You may place a small pillow or folded towel at the base of your lower back for  support.  When working at a desk, create an environment that supports good, upright posture. Without extra support, muscles tire, which leads to excessive strain on joints and other tissues. Keep these recommendations in mind: CHAIR:  A chair should be able to slide under your desk when your back makes contact with the back of the chair. This allows you to work closely.  The chair's  height should allow your eyes to be level with the upper part of your monitor and your hands to be slightly lower than your elbows. BODY POSITION  Your feet should make contact with  the floor. If this is not possible, use a foot rest.  Keep your ears over your shoulders. This will reduce stress on your neck and low back. INCORRECT SITTING POSTURES  If you are feeling tired and unable to assume a healthy sitting posture, do not slouch or slump. This puts excessive strain on your back tissues, causing more damage and pain. Healthier options include:  Using more support, like a lumbar pillow.  Switching tasks to something that requires you to be upright or walking.  Talking a brief walk.  Lying down to rest in a neutral-spine position. PROLONGED STANDING WHILE SLIGHTLY LEANING FORWARD  When completing a task that requires you to lean forward while standing in one place for a long time, place either foot up on a stationary 2-4 inch high object to help maintain the best posture. When both feet are on the ground, the lower back tends to lose its slight inward curve. If this curve flattens (or becomes too large), then the back and your other joints will experience too much stress, tire more quickly, and can cause pain. CORRECT STANDING POSTURES Proper standing posture should be assumed with all daily activities, even if they only take a few moments, like when brushing your teeth. As in sitting, your ears should fall over your shoulders and your shoulders should fall over your hips. You should keep a slight tension in your abdominal muscles to brace your spine. Your tailbone should point down to the ground, not behind your body, resulting in an over-extended swayback posture.  INCORRECT STANDING POSTURES  Common incorrect standing postures include a forward head, locked knees and/or an excessive swayback. WALKING Walk with an upright posture. Your ears, shoulders and hips should all  line-up. PROLONGED ACTIVITY IN A FLEXED POSITION When completing a task that requires you to bend forward at your waist or lean over a low surface, try to find a way to stabilize 3 out of 4 of your limbs. You can place a hand or elbow on your thigh or rest a knee on the surface you are reaching across. This will provide you more stability, so that your muscles do not tire as quickly. By keeping your knees relaxed, or slightly bent, you will also reduce stress across your lower back. CORRECT LIFTING TECHNIQUES DO :  Assume a wide stance. This will provide you more stability and the opportunity to get as close as possible to the object which you are lifting.  Tense your abdominals to brace your spine. Bend at the knees and hips. Keeping your back locked in a neutral-spine position, lift using your leg muscles. Lift with your legs, keeping your back straight.  Test the weight of unknown objects before attempting to lift them.  Try to keep your elbows locked down at your sides in order get the best strength from your shoulders when carrying an object.  Always ask for help when lifting heavy or awkward objects. INCORRECT LIFTING TECHNIQUES DO NOT:   Lock your knees when lifting, even if it is a small object.  Bend and twist. Pivot at your feet or move your feet when needing to change directions.  Assume that you can safely pick up even a paperclip without proper posture. Document Released: 04/05/2005 Document Revised: 06/28/2011 Document Reviewed: 07/18/2008 [facility] Patient Information 2013 [place], [place].  Thank you for choosing me and [facility].  [name], M.D., Clement[facility] Graham  I LM on the [facility] Drs. Line to fill the gen. mobic 7.5 mg not  the 15mg .

## 2012-06-10 ENCOUNTER — Telehealth: Payer: Self-pay | Admitting: Oncology

## 2012-06-10 ENCOUNTER — Encounter: Payer: Self-pay | Admitting: Oncology

## 2012-08-29 ENCOUNTER — Other Ambulatory Visit (HOSPITAL_BASED_OUTPATIENT_CLINIC_OR_DEPARTMENT_OTHER): Payer: Medicaid Other

## 2012-08-29 ENCOUNTER — Other Ambulatory Visit: Payer: Self-pay | Admitting: *Deleted

## 2012-08-29 DIAGNOSIS — C49A0 Gastrointestinal stromal tumor, unspecified site: Secondary | ICD-10-CM

## 2012-08-29 DIAGNOSIS — C494 Malignant neoplasm of connective and soft tissue of abdomen: Secondary | ICD-10-CM

## 2012-08-29 LAB — CBC WITH DIFFERENTIAL/PLATELET
Basophils Absolute: 0 10*3/uL (ref 0.0–0.1)
Eosinophils Absolute: 0.1 10*3/uL (ref 0.0–0.5)
HCT: 41 % (ref 34.8–46.6)
HGB: 14.1 g/dL (ref 11.6–15.9)
MCV: 86.1 fL (ref 79.5–101.0)
MONO%: 9 % (ref 0.0–14.0)
NEUT#: 2.7 10*3/uL (ref 1.5–6.5)
NEUT%: 63.9 % (ref 38.4–76.8)
RDW: 13.3 % (ref 11.2–14.5)
lymph#: 1 10*3/uL (ref 0.9–3.3)

## 2012-08-29 LAB — COMPREHENSIVE METABOLIC PANEL (CC13)
Albumin: 3.6 g/dL (ref 3.5–5.0)
BUN: 17.7 mg/dL (ref 7.0–26.0)
Calcium: 8.8 mg/dL (ref 8.4–10.4)
Chloride: 107 mEq/L (ref 98–107)
Glucose: 118 mg/dl — ABNORMAL HIGH (ref 70–99)
Potassium: 4 mEq/L (ref 3.5–5.1)

## 2012-09-04 ENCOUNTER — Ambulatory Visit (HOSPITAL_BASED_OUTPATIENT_CLINIC_OR_DEPARTMENT_OTHER): Payer: Medicaid Other | Admitting: Oncology

## 2012-09-04 VITALS — BP 136/68 | HR 84 | Temp 97.6°F | Resp 18 | Ht 59.75 in | Wt 98.2 lb

## 2012-09-04 DIAGNOSIS — Z85028 Personal history of other malignant neoplasm of stomach: Secondary | ICD-10-CM

## 2012-09-04 DIAGNOSIS — R1013 Epigastric pain: Secondary | ICD-10-CM

## 2012-09-04 DIAGNOSIS — R509 Fever, unspecified: Secondary | ICD-10-CM

## 2012-09-04 NOTE — Progress Notes (Signed)
Patient presents to clinic today with interpreter. Main complaint is upper abdominal pain that radiates to right side and around to her back. Is out of all her pain medications and Protonix. Has not recently refilled her Elavil. Also reports episodes of chills and fever (does not measure temp w/thermometer).

## 2012-09-04 NOTE — Progress Notes (Signed)
   [facility]    OFFICE PROGRESS NOTE   INTERVAL HISTORY:   She returns as scheduled. She has been followed by Dr. 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 She completed 1 year of adjuvant Gleevec therapy.  [name] presents today with an interpreter. She reports increased upper mid abdomen/back pain for the past one month. She has a fever" chills "in the evening. She also reports pain and tendernessat the right posterior lateral chest.no dyspnea.  Objective:  Vital signs in last 24 hours:  Blood pressure 136/68, pulse 84, temperature 97.6 F (36.4 C), temperature source Oral, resp. rate 18, height 4' 11.75" (1.518 m), weight 98 lb 3.2 oz (44.543 kg), SpO2 99.00%.    HEENT: neck without mass Lymphatics: no cervical, supraclavicular, axillary, or inguinal nodes Resp: lungs clear bilaterally Cardio: regular rate and rhythm GI: no hepatosplenomegaly, no apparent ascites, no mass, tender in the mid and upper abdomen Vascular: no leg edema  Skin:no rash at the right chest wall  Musculoskeletal: Tender at the mid right posterior lateral chest over the ribs. No mass  Lab Results:  Lab Results  Component Value Date   WBC 4.2 08/29/2012   HGB 14.1 08/29/2012   HCT 41.0 08/29/2012   MCV 86.1 08/29/2012   PLT 175 08/29/2012  ANC 2.7    Medications: I have reviewed the patient's current medications.  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  2.chronic abdominal pain/pancreatitis-status post an ERCP procedure 01/21/2012 with a sphincterotomy for treatment of a dilated common bile duct/chronic abdominal pain.. This was followed by post ERCP pancreatitis.   3.pain/tenderness at the right posterolateral chest wall-? Etiology     Disposition:  I have a low clinical suspicion for recurrence of the GIST.  She is now greater than 5 years out from the gastric resection. She complains of increased abdominal pain and fever/chills. I wonder whether her symptoms are related to pancreatitis.I will refer her back to Dr. Leone Payor for further evaluation. I will defer additional imaging studies to Dr. Leone Payor. We will attempt to add an amylase and lipase to the chemistry panel drawn on 08/29/2012.  The etiology of the right posterior/ lateral chest wall pain/tenderness is unclear. I think it is very unlikely that this is related to the history of a GIST.  [name] is not scheduled for a followup appointment in the oncology clinic. I will be glad to see her in the future as needed. She plans to continue clinical followup with Doctors Valentina Lucks and [place].  Thornton Papas, MD  09/04/2012  8:35 PM

## 2012-09-05 ENCOUNTER — Ambulatory Visit: Payer: Medicaid Other | Admitting: Hematology and Oncology

## 2012-09-08 NOTE — Progress Notes (Signed)
Being out of elavil could be the problem

## 2012-09-19 ENCOUNTER — Encounter: Payer: Self-pay | Admitting: Internal Medicine

## 2012-09-25 ENCOUNTER — Telehealth: Payer: Self-pay | Admitting: Oncology

## 2012-09-25 NOTE — Telephone Encounter (Signed)
Gave pt appt with GI Dr. Leone Payor on June 2014,, pt is aware

## 2012-10-12 ENCOUNTER — Encounter: Payer: Self-pay | Admitting: Internal Medicine

## 2012-10-12 ENCOUNTER — Ambulatory Visit (INDEPENDENT_AMBULATORY_CARE_PROVIDER_SITE_OTHER): Payer: Medicaid Other | Admitting: Internal Medicine

## 2012-10-12 VITALS — BP 130/74 | HR 72 | Ht 59.75 in | Wt 100.4 lb

## 2012-10-12 DIAGNOSIS — K219 Gastro-esophageal reflux disease without esophagitis: Secondary | ICD-10-CM

## 2012-10-12 DIAGNOSIS — R1013 Epigastric pain: Secondary | ICD-10-CM

## 2012-10-12 MED ORDER — PANTOPRAZOLE SODIUM 40 MG PO TBEC: 40.0000 mg | DELAYED_RELEASE_TABLET | Freq: Every day | ORAL | Status: DC

## 2012-10-12 NOTE — Assessment & Plan Note (Signed)
Still has some problems but overall doing well. S/p ERCP and sphincterotomy given dilated bile ducts and recovered from pancreatitis Will continue amitriptyline and refill pantoprazole as she benefits from these

## 2012-10-12 NOTE — Assessment & Plan Note (Signed)
Continue pantoprazole. °

## 2012-10-12 NOTE — Patient Instructions (Addendum)
Today we are giving you a printed rx for pantoprazole to take to your pharmacy.  Follow up with [place] in a year , we will send you a reminder.   I appreciate the opportunity to care for you.

## 2012-10-12 NOTE — Progress Notes (Signed)
  Subjective:    Patient ID: [name], female    DOB: 1946/02/12, 67 y.o.   MRN: [id number]  HPI Patient is here for followup, an interpreter is with her. She has chronic recurrent epigastric pain after resection of a gastrointestinal stromal tumor from her stomach years ago. More recently she had an increase in pain and dilation of common bile duct. She also had abnormal LFTs. CT and MR imaging raised questions of bile duct pathology and ERCP was performed. It revealed a dilated bile duct and some irregular beading in 2 small intrahepatic ducts of unclear significance. Sphincterotomy had been performed. She unfortunately developed some post ERCP pancreatitis but she eventually recovered from that after this procedure last fall.  She reports today that she is out of her pantoprazole might that refilled. She has intermittent bloating and epigastric discomfort but overall life is tolerable on that and amitriptyline 25 mg at bedtime. Her weight seems stable overall.  Medications, allergies, past medical history, past surgical history, family history and social history are reviewed and updated in the EMR.  Review of Systems As per history of present illness    Objective:   Physical Exam Well-developed thin Asian woman in no acute distress Abdomen is soft and nontender       Assessment & Plan:   1. EPIGASTRIC PAIN, CHRONIC   2. GERD (gastroesophageal reflux disease)

## 2013-02-26 ENCOUNTER — Encounter: Payer: Self-pay | Admitting: Hematology and Oncology

## 2013-02-26 NOTE — Progress Notes (Signed)
Patient and daughter came in to make sure medicaid [place] access was filed. I sent copy of bill to billing to make sure all filed. 223.00 is also in bad debt.

## 2013-04-17 DIAGNOSIS — H2510 Age-related nuclear cataract, unspecified eye: Secondary | ICD-10-CM | POA: Diagnosis not present

## 2013-05-08 DIAGNOSIS — H2510 Age-related nuclear cataract, unspecified eye: Secondary | ICD-10-CM | POA: Diagnosis not present

## 2013-08-21 ENCOUNTER — Encounter: Payer: Self-pay | Admitting: Internal Medicine

## 2013-08-28 DIAGNOSIS — I10 Essential (primary) hypertension: Secondary | ICD-10-CM | POA: Diagnosis not present

## 2013-08-28 DIAGNOSIS — Z23 Encounter for immunization: Secondary | ICD-10-CM | POA: Diagnosis not present

## 2013-08-28 DIAGNOSIS — K219 Gastro-esophageal reflux disease without esophagitis: Secondary | ICD-10-CM | POA: Diagnosis not present

## 2013-11-28 ENCOUNTER — Encounter: Payer: Self-pay | Admitting: Gastroenterology

## 2013-12-17 ENCOUNTER — Encounter: Payer: Self-pay | Admitting: Internal Medicine

## 2013-12-17 ENCOUNTER — Ambulatory Visit (INDEPENDENT_AMBULATORY_CARE_PROVIDER_SITE_OTHER): Payer: Medicare Other | Admitting: Internal Medicine

## 2013-12-17 VITALS — BP 110/56 | HR 76 | Ht 59.5 in | Wt 98.4 lb

## 2013-12-17 DIAGNOSIS — G4700 Insomnia, unspecified: Secondary | ICD-10-CM

## 2013-12-17 DIAGNOSIS — Z85028 Personal history of other malignant neoplasm of stomach: Secondary | ICD-10-CM | POA: Diagnosis not present

## 2013-12-17 DIAGNOSIS — R1013 Epigastric pain: Secondary | ICD-10-CM

## 2013-12-17 DIAGNOSIS — K219 Gastro-esophageal reflux disease without esophagitis: Secondary | ICD-10-CM

## 2013-12-17 MED ORDER — AMITRIPTYLINE HCL 25 MG PO TABS: 25.0000 mg | ORAL_TABLET | Freq: Every day | ORAL | Status: DC

## 2013-12-17 NOTE — Progress Notes (Signed)
   Subjective:    Patient ID: Efrain Sella, female    DOB: 1945-10-23, 68 y.o.   MRN: [id number]  HPI Patient is here with a family member interpreter. She ran out of her amitriptyline a few months ago and ever got a call for refill she schedule followup appointment but it took a while to get in so she has been suffering with recurrent epigastric pain. Sometimes she has pain that radiates up into the chest and the for years. She is not sleeping well now she is off the amitriptyline as well. She is moving her bowels well there is no unintentional weight loss. No Known Allergies Outpatient Prescriptions Prior to Visit  Medication Sig Dispense Refill  . amLODipine (NORVASC) 10 MG tablet Take 10 mg by mouth daily.        . pantoprazole (PROTONIX) 40 MG tablet Take 1 tablet (40 mg total) by mouth daily.  30 tablet  11  . pravastatin (PRAVACHOL) 40 MG tablet Take 40 mg by mouth daily.      Marland Kitchen amitriptyline (ELAVIL) 25 MG tablet Take 1 tablet (25 mg total) by mouth at bedtime.  30 tablet  11   No facility-administered medications prior to visit.   Past Medical History  Diagnosis Date  . gist dx'd 02/2007    gleevac comp  . 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 Hypertension   . Arthritis   . Internal hemorrhoids   . Vertigo   . Acute pancreatitis after ERCP  01/22/2012  . GERD (gastroesophageal reflux disease)    Past Surgical History  Procedure Laterality Date  . Gist resection    . Esophagogastroduodenoscopy    . Colonoscopy    . Cholecystectomy    . Ercp  01/21/2012    Procedure: ENDOSCOPIC RETROGRADE CHOLANGIOPANCREATOGRAPHY (ERCP);  Surgeon: Gatha Mayer, MD;  Location: [facility];  Service: Endoscopy;  Laterality: N/A;  Needs interpreter- vietnanese   Review of Systems As above    Objective:   Physical Exam  General:  NAD Eyes:   anicteric Lungs:  clear Heart:  S1S2 no rubs, murmurs or gallops Abdomen:  soft and nontender,  BS+ Ext:   no edema    Data Reviewed:  Wt Readings from Last 3 Encounters:  12/17/13 98 lb 6 oz (44.623 kg)  10/12/12 100 lb 6.4 oz (45.541 kg)  09/04/12 98 lb 3.2 oz (44.543 kg)      Assessment & Plan:  EPIGASTRIC PAIN, CHRONIC Refill amitriptyline See me in 2 moths Reviewed access issues - she should see me and not PCP if having pain in her complicated situation If still having problems consider higher dose amitriptyline vs duloxetine, vs buspirone, may need repeat CT scan though probably not  PERSONAL HISTORY GIST,  PROXIMAL STOMACH cured  INSOMNIA Restart amitriptyline  GERD (gastroesophageal reflux disease) Stay on PPI   CC: Irven Shelling, MD

## 2013-12-17 NOTE — Assessment & Plan Note (Signed)
Restart amitriptyline

## 2013-12-17 NOTE — Assessment & Plan Note (Signed)
Stay on PPI 

## 2013-12-17 NOTE — Assessment & Plan Note (Signed)
cured

## 2013-12-17 NOTE — Assessment & Plan Note (Addendum)
Refill amitriptyline See me in 2 moths Reviewed access issues - she should see me and not PCP if having pain in her complicated situation If still having problems consider higher dose amitriptyline vs duloxetine, vs buspirone, may need repeat CT scan though probably not

## 2013-12-17 NOTE — Patient Instructions (Addendum)
Follow up with [place] in 2 months, appointment made for November 3rd 2015.   I appreciate the opportunity to care for you.

## 2014-02-19 ENCOUNTER — Ambulatory Visit (INDEPENDENT_AMBULATORY_CARE_PROVIDER_SITE_OTHER): Payer: Medicare Other | Admitting: Internal Medicine

## 2014-02-19 ENCOUNTER — Other Ambulatory Visit (INDEPENDENT_AMBULATORY_CARE_PROVIDER_SITE_OTHER): Payer: Medicare Other

## 2014-02-19 ENCOUNTER — Encounter: Payer: Self-pay | Admitting: Internal Medicine

## 2014-02-19 VITALS — BP 110/70 | HR 68 | Ht 59.0 in | Wt 95.0 lb

## 2014-02-19 DIAGNOSIS — R1013 Epigastric pain: Secondary | ICD-10-CM

## 2014-02-19 DIAGNOSIS — G8929 Other chronic pain: Secondary | ICD-10-CM

## 2014-02-19 DIAGNOSIS — Z8719 Personal history of other diseases of the digestive system: Secondary | ICD-10-CM

## 2014-02-19 DIAGNOSIS — Z8509 Personal history of malignant neoplasm of other digestive organs: Secondary | ICD-10-CM

## 2014-02-19 LAB — CBC WITH DIFFERENTIAL/PLATELET
BASOS ABS: 0 10*3/uL (ref 0.0–0.1)
Basophils Relative: 0.4 % (ref 0.0–3.0)
EOS ABS: 0.1 10*3/uL (ref 0.0–0.7)
Eosinophils Relative: 2.2 % (ref 0.0–5.0)
HEMATOCRIT: 45.2 % (ref 36.0–46.0)
Hemoglobin: 15.2 g/dL — ABNORMAL HIGH (ref 12.0–15.0)
LYMPHS ABS: 1.4 10*3/uL (ref 0.7–4.0)
Lymphocytes Relative: 24.1 % (ref 12.0–46.0)
MCHC: 33.7 g/dL (ref 30.0–36.0)
MCV: 85.6 fl (ref 78.0–100.0)
MONO ABS: 0.4 10*3/uL (ref 0.1–1.0)
Monocytes Relative: 6.8 % (ref 3.0–12.0)
NEUTROS PCT: 66.5 % (ref 43.0–77.0)
Neutro Abs: 4 10*3/uL (ref 1.4–7.7)
PLATELETS: 231 10*3/uL (ref 150.0–400.0)
RBC: 5.28 Mil/uL — ABNORMAL HIGH (ref 3.87–5.11)
RDW: 12.9 % (ref 11.5–15.5)
WBC: 6 10*3/uL (ref 4.0–10.5)

## 2014-02-19 LAB — COMPREHENSIVE METABOLIC PANEL
ALT: 17 U/L (ref 0–35)
AST: 20 U/L (ref 0–37)
Albumin: 3.9 g/dL (ref 3.5–5.2)
Alkaline Phosphatase: 99 U/L (ref 39–117)
BILIRUBIN TOTAL: 1.4 mg/dL — AB (ref 0.2–1.2)
BUN: 23 mg/dL (ref 6–23)
CALCIUM: 9.8 mg/dL (ref 8.4–10.5)
CHLORIDE: 107 meq/L (ref 96–112)
CO2: 26 mEq/L (ref 19–32)
CREATININE: 0.8 mg/dL (ref 0.4–1.2)
GFR: 74.95 mL/min (ref >60.00)
Glucose, Bld: 111 mg/dL — ABNORMAL HIGH (ref 70–99)
Potassium: 4.8 mEq/L (ref 3.5–5.1)
Sodium: 140 mEq/L (ref 135–145)
Total Protein: 7.6 g/dL (ref 6.0–8.3)

## 2014-02-19 LAB — LIPASE: LIPASE: 40 U/L (ref 11.0–59.0)

## 2014-02-19 LAB — AMYLASE: AMYLASE: 75 U/L (ref 27–131)

## 2014-02-19 NOTE — Progress Notes (Signed)
   Subjective:    Patient ID: [name], female    DOB: 11/23/1945, 68 y.o.   MRN: [id number]  HPI Here for f/u of chronic epigastric pain. She restarted amitriptyline 25 mg qhs and only feels slightly better Has early satiety Sleeps 3 hrs a night  Worried about tumor recurrence  Medications, allergies, past medical history, past surgical history, family history and social history are reviewed and updated in the EMR.   Review of Systems As above    Objective:   Physical Exam Thin [place] woman in NAD Abdomen is soft with moderate epigastric tenderness, BS+       Assessment & Plan:  Abdominal pain, chronic, epigastric - Plan: CBC with Differential, Amylase, Lipase, Comprehensive metabolic panel, CT Abdomen Pelvis W Contrast  History of pancreatitis - Plan: CBC with Differential, Amylase, Lipase, Comprehensive metabolic panel, CT Abdomen Pelvis W Contrast  History of malignant gastrointestinal stromal tumor (GIST) - Plan: CT Abdomen Pelvis W Contrast  Will go ahead and check labs ( CMET, amylase and lipase) and CT abd. ? Some delayed effect of the pancreatitis like a pseudocyst. Probably not but in the past she has responded to amitriptyline well and is not this time.  Consider other Tx of functional GI pain like duloxetine or higher dose of amitriptyline pending CT results.  Will see in f/u after CT completed.   OM:AYOKHTX,HFSF Dawn John, MD

## 2014-02-19 NOTE — Patient Instructions (Signed)
Your physician has requested that you go to the basement for the following lab work before leaving today: CBC, CMET, Amylase, Lipase  You have been scheduled for a CT scan of the abdomen and pelvis at [facility] ([address]---this is in the same building as Press [facility]).   You are scheduled on 03/05/14 at 9:00am. You should arrive 15 minutes prior to your appointment time for registration. Please follow the written instructions below on the day of your exam:  WARNING: IF YOU ARE ALLERGIC TO IODINE/X-RAY DYE, PLEASE NOTIFY RADIOLOGY IMMEDIATELY AT [phone]! YOU WILL BE GIVEN A 13 HOUR PREMEDICATION PREP.  1) Do not eat or drink anything after 5:00am (4 hours prior to your test) 2) You have been given 2 bottles of oral contrast to drink. The solution may taste               better if refrigerated, but do NOT add ice or any other liquid to this solution. Shake             well before drinking.    Drink 1 bottle of contrast @ 7:00am (2 hours prior to your exam)  Drink 1 bottle of contrast @ 8:00am (1 hour prior to your exam)  You may take any medications as prescribed with a small amount of water except for the following: Metformin, Glucophage, Glucovance, Avandamet, Riomet, Fortamet, Actoplus Met, Janumet, Glumetza or Metaglip. The above medications must be held the day of the exam AND 48 hours after the exam.  The purpose of you drinking the oral contrast is to aid in the visualization of your intestinal tract. The contrast solution may cause some diarrhea. Before your exam is started, you will be given a small amount of fluid to drink. Depending on your individual set of symptoms, you may also receive an intravenous injection of x-ray contrast/dye. Plan on being at [facility] for 30 minutes or long, depending on the type of exam you are having performed.  This test typically takes 30-45 minutes to complete.  If you have any questions regarding your exam or if  you need to reschedule, you may call the CT department at [phone] between the hours of 8:00 am and 5:00 pm, Monday-Friday.  ________________________________________________________________________  We have made you a follow up appointment with Dr Carlean Purl on November 24th at 10:15am   I appreciate the opportunity to care for you.

## 2014-03-04 DIAGNOSIS — Z23 Encounter for immunization: Secondary | ICD-10-CM | POA: Diagnosis not present

## 2014-03-04 DIAGNOSIS — I10 Essential (primary) hypertension: Secondary | ICD-10-CM | POA: Diagnosis not present

## 2014-03-05 ENCOUNTER — Ambulatory Visit (INDEPENDENT_AMBULATORY_CARE_PROVIDER_SITE_OTHER)
Admission: RE | Admit: 2014-03-05 | Discharge: 2014-03-05 | Disposition: A | Discharge status: Home / Self Care | Payer: Medicare Other | Source: Ambulatory Visit | Attending: Internal Medicine | Admitting: Internal Medicine

## 2014-03-05 DIAGNOSIS — Z8509 Personal history of malignant neoplasm of other digestive organs: Secondary | ICD-10-CM | POA: Diagnosis not present

## 2014-03-05 DIAGNOSIS — K859 Acute pancreatitis, unspecified: Secondary | ICD-10-CM | POA: Diagnosis not present

## 2014-03-05 DIAGNOSIS — Z8719 Personal history of other diseases of the digestive system: Secondary | ICD-10-CM | POA: Diagnosis not present

## 2014-03-05 DIAGNOSIS — R1013 Epigastric pain: Secondary | ICD-10-CM | POA: Diagnosis not present

## 2014-03-05 DIAGNOSIS — G8929 Other chronic pain: Secondary | ICD-10-CM | POA: Diagnosis not present

## 2014-03-05 MED ORDER — IOHEXOL 300 MG/ML  SOLN
100.0000 mL | Freq: Once | INTRAMUSCULAR | Status: AC | PRN
  Administered 2014-03-05: 100 mL via INTRAVENOUS

## 2014-03-05 NOTE — Progress Notes (Signed)
Quick Note:  OK Will review 11/24 visit ______

## 2014-03-12 ENCOUNTER — Ambulatory Visit (INDEPENDENT_AMBULATORY_CARE_PROVIDER_SITE_OTHER): Payer: Medicare Other | Admitting: Internal Medicine

## 2014-03-12 ENCOUNTER — Encounter: Payer: Self-pay | Admitting: Internal Medicine

## 2014-03-12 VITALS — BP 118/70 | HR 70 | Ht 59.45 in | Wt 85.0 lb

## 2014-03-12 DIAGNOSIS — J069 Acute upper respiratory infection, unspecified: Secondary | ICD-10-CM | POA: Diagnosis not present

## 2014-03-12 DIAGNOSIS — R1013 Epigastric pain: Secondary | ICD-10-CM | POA: Diagnosis not present

## 2014-03-12 DIAGNOSIS — R05 Cough: Secondary | ICD-10-CM

## 2014-03-12 DIAGNOSIS — R058 Other specified cough: Secondary | ICD-10-CM

## 2014-03-12 MED ORDER — GUAIFENESIN-CODEINE 200-20 MG/5ML PO LIQD: 5.0000 mL | Freq: Four times a day (QID) | ORAL | Status: DC | PRN

## 2014-03-12 NOTE — Assessment & Plan Note (Signed)
OK presently  Ct scan and labs ok Reassured - continue Amitriptyline See me 1 yr - sooner prn

## 2014-03-12 NOTE — Patient Instructions (Addendum)
Today we are giving you a rx for cough medicine to take to the pharmacy.  If your cough persist for another week go to see your primary care Dr.   Follow up with [place] in a year or sooner.    I appreciate the opportunity to care for you.

## 2014-03-12 NOTE — Progress Notes (Signed)
   Subjective:    Patient ID: [name], female    DOB: 23-Dec-1945, 68 y.o.   MRN: [id number]  HPI Here for f/u after CT abd performed - showed post-op changes but no sig abnormality.  Dry cough x 1 week No fever + rhinorrhea Not better despite Mucinex and Delsym  No abd pain today  Medications, allergies, past medical history, past surgical history, family history and social history are reviewed and updated in the EMR.   Review of Systems As above    Objective:   Physical Exam WDWN NAD Oral and posterior pharynx are clear No cervical LA No sinus tenderness Lungs clear       Assessment & Plan:  EPIGASTRIC PAIN, CHRONIC  Upper respiratory infection, viral  Dry cough  EPIGASTRIC PAIN, CHRONIC OK presently  Ct scan and labs ok Reassured - continue Amitriptyline See me 1 yr - sooner prn    Will treat cough with guaifenesin-codeine  If no better in 1 week or worse see PCP  UN:GBMBOMQ,TTCN Broadus John, MD

## 2014-07-01 DIAGNOSIS — H35372 Puckering of macula, left eye: Secondary | ICD-10-CM | POA: Diagnosis not present

## 2014-07-01 DIAGNOSIS — Z961 Presence of intraocular lens: Secondary | ICD-10-CM | POA: Diagnosis not present

## 2014-09-24 ENCOUNTER — Other Ambulatory Visit: Payer: Self-pay | Admitting: Internal Medicine

## 2014-09-24 DIAGNOSIS — Z0000 Encounter for general adult medical examination without abnormal findings: Secondary | ICD-10-CM | POA: Diagnosis not present

## 2014-09-24 DIAGNOSIS — Z5181 Encounter for therapeutic drug level monitoring: Secondary | ICD-10-CM | POA: Diagnosis not present

## 2014-09-24 DIAGNOSIS — I10 Essential (primary) hypertension: Secondary | ICD-10-CM | POA: Diagnosis not present

## 2014-09-24 DIAGNOSIS — K219 Gastro-esophageal reflux disease without esophagitis: Secondary | ICD-10-CM | POA: Diagnosis not present

## 2014-09-24 DIAGNOSIS — D481 Neoplasm of uncertain behavior of connective and other soft tissue: Secondary | ICD-10-CM | POA: Diagnosis not present

## 2014-09-24 DIAGNOSIS — Z1389 Encounter for screening for other disorder: Secondary | ICD-10-CM | POA: Diagnosis not present

## 2014-09-24 DIAGNOSIS — Z1231 Encounter for screening mammogram for malignant neoplasm of breast: Secondary | ICD-10-CM

## 2014-09-24 DIAGNOSIS — Z79899 Other long term (current) drug therapy: Secondary | ICD-10-CM | POA: Diagnosis not present

## 2014-10-01 ENCOUNTER — Ambulatory Visit
Admission: RE | Admit: 2014-10-01 | Discharge: 2014-10-01 | Disposition: A | Discharge status: Home / Self Care | Payer: Medicare Other | Source: Ambulatory Visit | Attending: Internal Medicine | Admitting: Internal Medicine

## 2014-10-01 DIAGNOSIS — Z1231 Encounter for screening mammogram for malignant neoplasm of breast: Secondary | ICD-10-CM | POA: Diagnosis not present

## 2015-03-24 ENCOUNTER — Other Ambulatory Visit: Payer: Self-pay | Admitting: Nurse Practitioner

## 2015-03-24 ENCOUNTER — Ambulatory Visit
Admission: RE | Admit: 2015-03-24 | Discharge: 2015-03-24 | Disposition: A | Discharge status: Home / Self Care | Payer: Medicare Other | Source: Ambulatory Visit | Attending: Nurse Practitioner | Admitting: Nurse Practitioner

## 2015-03-24 DIAGNOSIS — R0609 Other forms of dyspnea: Principal | ICD-10-CM

## 2015-03-24 DIAGNOSIS — R0600 Dyspnea, unspecified: Secondary | ICD-10-CM | POA: Diagnosis not present

## 2015-03-24 DIAGNOSIS — Z23 Encounter for immunization: Secondary | ICD-10-CM | POA: Diagnosis not present

## 2015-03-24 DIAGNOSIS — I10 Essential (primary) hypertension: Secondary | ICD-10-CM | POA: Diagnosis not present

## 2015-03-24 DIAGNOSIS — R000 Tachycardia, unspecified: Secondary | ICD-10-CM | POA: Diagnosis not present

## 2015-04-02 ENCOUNTER — Encounter: Payer: Self-pay | Admitting: Internal Medicine

## 2015-09-29 DIAGNOSIS — Z131 Encounter for screening for diabetes mellitus: Secondary | ICD-10-CM | POA: Diagnosis not present

## 2015-09-29 DIAGNOSIS — Z0000 Encounter for general adult medical examination without abnormal findings: Secondary | ICD-10-CM | POA: Diagnosis not present

## 2015-09-29 DIAGNOSIS — Z1389 Encounter for screening for other disorder: Secondary | ICD-10-CM | POA: Diagnosis not present

## 2015-09-29 DIAGNOSIS — I10 Essential (primary) hypertension: Secondary | ICD-10-CM | POA: Diagnosis not present

## 2015-09-29 DIAGNOSIS — K219 Gastro-esophageal reflux disease without esophagitis: Secondary | ICD-10-CM | POA: Diagnosis not present

## 2015-10-06 DIAGNOSIS — H35372 Puckering of macula, left eye: Secondary | ICD-10-CM | POA: Diagnosis not present

## 2015-10-06 DIAGNOSIS — H26493 Other secondary cataract, bilateral: Secondary | ICD-10-CM | POA: Diagnosis not present

## 2015-11-10 DIAGNOSIS — H26493 Other secondary cataract, bilateral: Secondary | ICD-10-CM | POA: Diagnosis not present

## 2015-11-25 DIAGNOSIS — H26491 Other secondary cataract, right eye: Secondary | ICD-10-CM | POA: Diagnosis not present

## 2015-12-16 DIAGNOSIS — H26492 Other secondary cataract, left eye: Secondary | ICD-10-CM | POA: Diagnosis not present

## 2016-01-08 ENCOUNTER — Other Ambulatory Visit: Payer: Self-pay | Admitting: Gastroenterology

## 2016-01-08 ENCOUNTER — Ambulatory Visit (INDEPENDENT_AMBULATORY_CARE_PROVIDER_SITE_OTHER): Payer: Medicare Other | Admitting: Gastroenterology

## 2016-01-08 ENCOUNTER — Encounter: Payer: Self-pay | Admitting: Gastroenterology

## 2016-01-08 VITALS — BP 102/56 | HR 60 | Ht 59.45 in | Wt 93.5 lb

## 2016-01-08 DIAGNOSIS — G8929 Other chronic pain: Secondary | ICD-10-CM

## 2016-01-08 DIAGNOSIS — Z85028 Personal history of other malignant neoplasm of stomach: Secondary | ICD-10-CM | POA: Diagnosis not present

## 2016-01-08 DIAGNOSIS — R1013 Epigastric pain: Secondary | ICD-10-CM | POA: Diagnosis not present

## 2016-01-08 MED ORDER — AMITRIPTYLINE HCL 25 MG PO TABS: ORAL_TABLET | ORAL | 1 refills | Status: DC

## 2016-01-08 NOTE — Progress Notes (Addendum)
     01/08/2016 MANNING AVELLA [id number] 06/03/1945   History of Present Illness:  This is a 70 year old [place] female who is known to Dr. Carlean Purl for chronic epigastric abdominal pain. She did have a GIST tumor that was removed and her pain has been present since that time. He previously was on amitriptyline and was doing well at her last visit with Dr. Carlean Purl November 2015. She has not returned here since that time until today. Says that about 3 weeks ago she started having pain again that she says in the same pain that she's had in the past.  She does remain on pantoprazole 40 mg daily, however.  Current Medications, Allergies, Past Medical History, Past Surgical History, Family History and Social History were reviewed in [employer] record.   Physical Exam: BP (!) 102/56   Pulse 60   Ht 4' 11.45" (1.51 m)   Wt 93 lb 8 oz (42.4 kg)   BMI 18.60 kg/m  General: Well developed [place] female in no acute distress Head: Normocephalic and atraumatic Eyes:  Sclerae anicteric, conjunctiva pink  Ears: Normal auditory acuity Lungs: Clear throughout to auscultation Heart: Regular rate and rhythm Abdomen: Soft, non-distended.  Normal bowel sounds.  Scar noted on upper abdomen.  TTP over scar and just to the right of that area. Musculoskeletal: Symmetrical with no gross deformities  Extremities: No edema  Neurological: Alert oriented x 4, grossly non-focal Psychological:  Alert and cooperative. Normal mood and affect  Assessment and Recommendations: -Chronic epigastric abdominal pain since surgery for removal of GIST tumor:  Previously improved on amitriptyline. She has been off of that and had been feeling well. Will restart that beginning with 25 mg tablets taking a half a tablet before bedtime for one week and increasing to 1 tablet at bedtime. 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 We'll have her follow-up with Dr. Carlean Purl in 6-8 weeks to  assess improvement and determine need for any further testing or trial of different medication such as duloxetine, etc.  Continue pantoprazole 40 mg daily as well.

## 2016-01-08 NOTE — Patient Instructions (Addendum)
We sent a presccription for Elavil to [employer].   We made you an appointment with Dr. Silvano Rusk for 02-24-2016 at 3:00 PM

## 2016-01-12 ENCOUNTER — Telehealth: Payer: Self-pay | Admitting: *Deleted

## 2016-01-12 NOTE — Telephone Encounter (Signed)
Received fax from [employer]urance, they approved the Amitriptyline HCL , Referral letter [id number]. Effective Date: 04/18/2015, expiration 04/18/2016.

## 2016-01-12 NOTE — Telephone Encounter (Signed)
[place] to [place] we got an approval on the prior authorization for the Elavil prescription. The pharmacist was very busy when I called and he said he will call back if he has any trouble filling it.

## 2016-01-21 NOTE — Progress Notes (Signed)
Agree with Ms. Zehr's management.  Talayah Picardi E. Oda Lansdowne, MD, FACG  

## 2016-02-24 ENCOUNTER — Ambulatory Visit (INDEPENDENT_AMBULATORY_CARE_PROVIDER_SITE_OTHER): Payer: Medicare Other | Admitting: Internal Medicine

## 2016-02-24 ENCOUNTER — Encounter (INDEPENDENT_AMBULATORY_CARE_PROVIDER_SITE_OTHER): Payer: Self-pay

## 2016-02-24 ENCOUNTER — Encounter: Payer: Self-pay | Admitting: Internal Medicine

## 2016-02-24 DIAGNOSIS — R1013 Epigastric pain: Secondary | ICD-10-CM

## 2016-02-24 MED ORDER — AMITRIPTYLINE HCL 50 MG PO TABS: 50.0000 mg | ORAL_TABLET | Freq: Every day | ORAL | 3 refills | Status: DC

## 2016-02-24 NOTE — Patient Instructions (Addendum)
  Increase your amitripyline to 50mg  daily.  We have sent this rx to the [facility] on [address] ([facility])    See Dr Lavone Orn for the sore places on your face.     Come back and see [place] 05/03/2016 at 11:00AM.      I appreciate the opportunity to care for you. Silvano Rusk, MD, [facility]

## 2016-02-24 NOTE — Assessment & Plan Note (Signed)
Increase amitriptyline to 50 mg RTC 2 months

## 2016-02-24 NOTE — Progress Notes (Signed)
   [name] 70 y.o. 1946-02-25 [id number]  Assessment & Plan:  EPIGASTRIC PAIN, CHRONIC Increase amitriptyline to 50 mg RTC 2 months   I appreciate the opportunity to care for this patient. Gatha Mayer, MD, [facility]  Subjective:   Chief Complaint: Epigastric pain  HPI  The patient is here complaining of persistent epigastric pain. She was seen in September by Alonza Bogus [facility]. At that point amitriptyline was added back. In the past she has had chronic recurrent burning epigastric pain that radiates under both breast areas. She is seen with the assistance of an interpreter, as best I can tell these pains are the same that she has suffered with over the years. She has some mild nausea. Her biggest complaint seems to be she is able to get to sleep but then she awakens with abdominal pain again at night. She is generally able to eat well. Weight has been stable to slightly increased.   Medications, allergies, past medical history, past surgical history, family history and social history are reviewed and updated in the EMR.  Review of Systems Itching, scaly lesions after scratching on cheeks Wt Readings from Last 3 Encounters:  02/24/16 95 lb 3.2 oz (43.2 kg)  01/08/16 93 lb 8 oz (42.4 kg)  03/12/14 85 lb (38.6 kg)    Objective:   Physical Exam BP 110/70   Pulse 80   Ht 5' (1.524 m)   Wt 95 lb 3.2 oz (43.2 kg)   BMI 18.59 kg/m  Asian woman in no acute distress Eyes anicteric There is mild epigastric tenderness without mass. Appropriate mood and affect  I reviewed previous endoscopies, CT scanning, going back several years in the EMR. All GI notes reviewed as well her symptoms are entirely consistent with what she's had for years. Not sure why she came off of amitriptyline. In the past 25 mg has worked.  15 minutes time spent with patient > half in counseling coordination of care

## 2016-03-16 ENCOUNTER — Ambulatory Visit: Payer: Medicare Other | Admitting: Internal Medicine

## 2016-04-20 DIAGNOSIS — Z23 Encounter for immunization: Secondary | ICD-10-CM | POA: Diagnosis not present

## 2016-04-20 DIAGNOSIS — I10 Essential (primary) hypertension: Secondary | ICD-10-CM | POA: Diagnosis not present

## 2016-04-29 ENCOUNTER — Ambulatory Visit: Payer: Medicare Other | Admitting: Internal Medicine

## 2016-05-03 ENCOUNTER — Encounter (INDEPENDENT_AMBULATORY_CARE_PROVIDER_SITE_OTHER): Payer: Self-pay

## 2016-05-03 ENCOUNTER — Encounter: Payer: Self-pay | Admitting: Internal Medicine

## 2016-05-03 ENCOUNTER — Ambulatory Visit (INDEPENDENT_AMBULATORY_CARE_PROVIDER_SITE_OTHER): Payer: Medicare Other | Admitting: Internal Medicine

## 2016-05-03 DIAGNOSIS — F5101 Primary insomnia: Secondary | ICD-10-CM

## 2016-05-03 DIAGNOSIS — R1013 Epigastric pain: Secondary | ICD-10-CM | POA: Diagnosis not present

## 2016-05-03 MED ORDER — AMITRIPTYLINE HCL 50 MG PO TABS: 50.0000 mg | ORAL_TABLET | Freq: Every day | ORAL | 3 refills | Status: DC

## 2016-05-03 NOTE — Patient Instructions (Signed)
   Glad your doing well, continue your amitriptyline.    Follow up with Dr Carlean Purl in a year.     I appreciate the opportunity to care for you. Silvano Rusk, MD, [facility]

## 2016-05-03 NOTE — Assessment & Plan Note (Signed)
Improved on amitriptyline

## 2016-05-03 NOTE — Assessment & Plan Note (Signed)
Better on 50 mg amitriptyline

## 2016-05-03 NOTE — Progress Notes (Signed)
   [name] 71 y.o. March 08, 1946 [id number]  Assessment & Plan:  EPIGASTRIC PAIN, CHRONIC Better on 50 mg amitriptyline  Insomnia Improved on amitriptyline   She can see me in 1 year I emphasized need to stay on the amitriptyline as she gets recurrent sxs every time she stops it   Subjective:   Chief Complaint: f/u abdominal pain  HPI  Chronic epigastric pain is much improved on higher dose of amitriptyline and so is sleep. She is pleased with results.   Wt Readings from Last 3 Encounters:  05/03/16 96 lb 2 oz (43.6 kg)  02/24/16 95 lb 3.2 oz (43.2 kg)  01/08/16 93 lb 8 oz (42.4 kg)   Medications, allergies, past medical history, past surgical history, family history and social history are reviewed and updated in the EMR.   Review of Systems As above  Objective:   Physical Exam BP 116/70   Pulse 80   Ht 4' 11.5" (1.511 m)   Wt 96 lb 2 oz (43.6 kg)   BMI 19.09 kg/m   NAD

## 2016-10-05 ENCOUNTER — Other Ambulatory Visit: Payer: Self-pay | Admitting: Internal Medicine

## 2016-10-05 DIAGNOSIS — Z1231 Encounter for screening mammogram for malignant neoplasm of breast: Secondary | ICD-10-CM

## 2016-10-05 DIAGNOSIS — Z0000 Encounter for general adult medical examination without abnormal findings: Secondary | ICD-10-CM | POA: Diagnosis not present

## 2016-10-05 DIAGNOSIS — M810 Age-related osteoporosis without current pathological fracture: Secondary | ICD-10-CM | POA: Diagnosis not present

## 2016-10-05 DIAGNOSIS — R002 Palpitations: Secondary | ICD-10-CM | POA: Diagnosis not present

## 2016-10-05 DIAGNOSIS — Z1389 Encounter for screening for other disorder: Secondary | ICD-10-CM | POA: Diagnosis not present

## 2016-10-05 DIAGNOSIS — I10 Essential (primary) hypertension: Secondary | ICD-10-CM | POA: Diagnosis not present

## 2016-10-19 ENCOUNTER — Ambulatory Visit (INDEPENDENT_AMBULATORY_CARE_PROVIDER_SITE_OTHER): Payer: Medicare Other

## 2016-10-19 ENCOUNTER — Encounter (INDEPENDENT_AMBULATORY_CARE_PROVIDER_SITE_OTHER): Payer: Self-pay

## 2016-10-19 DIAGNOSIS — R002 Palpitations: Secondary | ICD-10-CM

## 2016-10-19 DIAGNOSIS — R42 Dizziness and giddiness: Secondary | ICD-10-CM | POA: Insufficient documentation

## 2016-10-26 ENCOUNTER — Ambulatory Visit
Admission: RE | Admit: 2016-10-26 | Discharge: 2016-10-26 | Disposition: A | Discharge status: Home / Self Care | Payer: Medicare Other | Source: Ambulatory Visit | Attending: Internal Medicine | Admitting: Internal Medicine

## 2016-10-26 DIAGNOSIS — Z1231 Encounter for screening mammogram for malignant neoplasm of breast: Secondary | ICD-10-CM | POA: Diagnosis not present

## 2016-11-23 DIAGNOSIS — M810 Age-related osteoporosis without current pathological fracture: Secondary | ICD-10-CM | POA: Diagnosis not present

## 2017-01-10 DIAGNOSIS — H5212 Myopia, left eye: Secondary | ICD-10-CM | POA: Diagnosis not present

## 2017-01-10 DIAGNOSIS — H5201 Hypermetropia, right eye: Secondary | ICD-10-CM | POA: Diagnosis not present

## 2017-01-10 DIAGNOSIS — Z961 Presence of intraocular lens: Secondary | ICD-10-CM | POA: Diagnosis not present

## 2017-01-10 DIAGNOSIS — H35372 Puckering of macula, left eye: Secondary | ICD-10-CM | POA: Diagnosis not present

## 2017-02-08 DIAGNOSIS — I10 Essential (primary) hypertension: Secondary | ICD-10-CM | POA: Diagnosis not present

## 2017-02-08 DIAGNOSIS — Z23 Encounter for immunization: Secondary | ICD-10-CM | POA: Diagnosis not present

## 2017-02-08 DIAGNOSIS — M810 Age-related osteoporosis without current pathological fracture: Secondary | ICD-10-CM | POA: Diagnosis not present

## 2017-02-08 DIAGNOSIS — J209 Acute bronchitis, unspecified: Secondary | ICD-10-CM | POA: Diagnosis not present

## 2017-05-31 ENCOUNTER — Encounter: Payer: Self-pay | Admitting: Internal Medicine

## 2017-06-08 ENCOUNTER — Other Ambulatory Visit: Payer: Self-pay | Admitting: Internal Medicine

## 2017-06-08 ENCOUNTER — Telehealth: Payer: Self-pay

## 2017-06-08 NOTE — Telephone Encounter (Signed)
I have filled out a [employer] 2019 request for Medicare prescription drug coverage determination form for patient's amitriptyline 50mg  tablets. I have put the form on Dr Celesta Aver desk for his signature and to help answer several other questions they have ask.  Once he has completed this we will fax it to [phone].

## 2017-06-13 NOTE — Telephone Encounter (Signed)
Amitriptyline approved thru 04/18/2018. [facility] informed. Fax from [place] sent to be scanned in.

## 2017-09-09 ENCOUNTER — Other Ambulatory Visit: Payer: Self-pay | Admitting: Internal Medicine

## 2017-10-11 DIAGNOSIS — M810 Age-related osteoporosis without current pathological fracture: Secondary | ICD-10-CM | POA: Diagnosis not present

## 2017-10-11 DIAGNOSIS — Z0000 Encounter for general adult medical examination without abnormal findings: Secondary | ICD-10-CM | POA: Diagnosis not present

## 2017-10-11 DIAGNOSIS — D481 Neoplasm of uncertain behavior of connective and other soft tissue: Secondary | ICD-10-CM | POA: Diagnosis not present

## 2017-10-11 DIAGNOSIS — Z13220 Encounter for screening for lipoid disorders: Secondary | ICD-10-CM | POA: Diagnosis not present

## 2017-10-11 DIAGNOSIS — Z1211 Encounter for screening for malignant neoplasm of colon: Secondary | ICD-10-CM | POA: Diagnosis not present

## 2017-10-11 DIAGNOSIS — K219 Gastro-esophageal reflux disease without esophagitis: Secondary | ICD-10-CM | POA: Diagnosis not present

## 2017-10-11 DIAGNOSIS — Z1389 Encounter for screening for other disorder: Secondary | ICD-10-CM | POA: Diagnosis not present

## 2017-10-11 DIAGNOSIS — E440 Moderate protein-calorie malnutrition: Secondary | ICD-10-CM | POA: Diagnosis not present

## 2017-10-11 DIAGNOSIS — I10 Essential (primary) hypertension: Secondary | ICD-10-CM | POA: Diagnosis not present

## 2017-12-08 ENCOUNTER — Other Ambulatory Visit: Payer: Self-pay | Admitting: Internal Medicine

## 2017-12-08 NOTE — Telephone Encounter (Signed)
How many refills Sir? Thank you. 

## 2017-12-09 NOTE — Telephone Encounter (Signed)
2 refills and send her a letter to call for an appointment

## 2017-12-09 NOTE — Telephone Encounter (Signed)
Letter mailed to ask her to call to set up an appointment. Medicine refilled as well.

## 2017-12-21 DIAGNOSIS — H1031 Unspecified acute conjunctivitis, right eye: Secondary | ICD-10-CM | POA: Diagnosis not present

## 2017-12-21 DIAGNOSIS — H5711 Ocular pain, right eye: Secondary | ICD-10-CM | POA: Diagnosis not present

## 2017-12-21 DIAGNOSIS — H00011 Hordeolum externum right upper eyelid: Secondary | ICD-10-CM | POA: Diagnosis not present

## 2018-01-18 DIAGNOSIS — J028 Acute pharyngitis due to other specified organisms: Secondary | ICD-10-CM | POA: Diagnosis not present

## 2018-01-18 DIAGNOSIS — H1032 Unspecified acute conjunctivitis, left eye: Secondary | ICD-10-CM | POA: Diagnosis not present

## 2018-01-19 DIAGNOSIS — H1032 Unspecified acute conjunctivitis, left eye: Secondary | ICD-10-CM | POA: Diagnosis not present

## 2018-03-23 DIAGNOSIS — H16201 Unspecified keratoconjunctivitis, right eye: Secondary | ICD-10-CM | POA: Diagnosis not present

## 2018-03-23 DIAGNOSIS — H524 Presbyopia: Secondary | ICD-10-CM | POA: Diagnosis not present

## 2018-03-23 DIAGNOSIS — H35372 Puckering of macula, left eye: Secondary | ICD-10-CM | POA: Diagnosis not present

## 2018-03-28 DIAGNOSIS — R5383 Other fatigue: Secondary | ICD-10-CM | POA: Diagnosis not present

## 2018-03-28 DIAGNOSIS — D481 Neoplasm of uncertain behavior of connective and other soft tissue: Secondary | ICD-10-CM | POA: Diagnosis not present

## 2018-03-28 DIAGNOSIS — I10 Essential (primary) hypertension: Secondary | ICD-10-CM | POA: Diagnosis not present

## 2018-03-28 DIAGNOSIS — M810 Age-related osteoporosis without current pathological fracture: Secondary | ICD-10-CM | POA: Diagnosis not present

## 2018-03-28 DIAGNOSIS — Z23 Encounter for immunization: Secondary | ICD-10-CM | POA: Diagnosis not present

## 2018-03-28 DIAGNOSIS — E440 Moderate protein-calorie malnutrition: Secondary | ICD-10-CM | POA: Diagnosis not present

## 2018-03-28 DIAGNOSIS — R0609 Other forms of dyspnea: Secondary | ICD-10-CM | POA: Diagnosis not present

## 2018-03-31 ENCOUNTER — Other Ambulatory Visit (HOSPITAL_COMMUNITY): Payer: Self-pay | Admitting: Internal Medicine

## 2018-03-31 DIAGNOSIS — R0600 Dyspnea, unspecified: Secondary | ICD-10-CM

## 2018-03-31 DIAGNOSIS — R0609 Other forms of dyspnea: Secondary | ICD-10-CM

## 2018-04-06 ENCOUNTER — Ambulatory Visit (INDEPENDENT_AMBULATORY_CARE_PROVIDER_SITE_OTHER): Payer: Medicare Other | Admitting: Nurse Practitioner

## 2018-04-06 ENCOUNTER — Other Ambulatory Visit (INDEPENDENT_AMBULATORY_CARE_PROVIDER_SITE_OTHER): Payer: Medicare Other

## 2018-04-06 ENCOUNTER — Encounter: Payer: Self-pay | Admitting: Nurse Practitioner

## 2018-04-06 ENCOUNTER — Encounter (INDEPENDENT_AMBULATORY_CARE_PROVIDER_SITE_OTHER): Payer: Self-pay

## 2018-04-06 VITALS — BP 110/70 | HR 96 | Ht 59.5 in | Wt 93.5 lb

## 2018-04-06 DIAGNOSIS — K625 Hemorrhage of anus and rectum: Secondary | ICD-10-CM | POA: Diagnosis not present

## 2018-04-06 DIAGNOSIS — R1013 Epigastric pain: Secondary | ICD-10-CM | POA: Diagnosis not present

## 2018-04-06 DIAGNOSIS — R110 Nausea: Secondary | ICD-10-CM | POA: Diagnosis not present

## 2018-04-06 DIAGNOSIS — G8929 Other chronic pain: Secondary | ICD-10-CM

## 2018-04-06 LAB — COMPREHENSIVE METABOLIC PANEL
ALK PHOS: 71 U/L (ref 39–117)
ALT: 14 U/L (ref 0–35)
AST: 18 U/L (ref 0–37)
Albumin: 4.6 g/dL (ref 3.5–5.2)
BUN: 12 mg/dL (ref 6–23)
CHLORIDE: 105 meq/L (ref 96–112)
CO2: 28 mEq/L (ref 19–32)
Calcium: 9.2 mg/dL (ref 8.4–10.5)
Creatinine, Ser: 0.71 mg/dL (ref 0.40–1.20)
GFR: 86.21 mL/min (ref >60.00)
GLUCOSE: 116 mg/dL — AB (ref 70–99)
POTASSIUM: 3.7 meq/L (ref 3.5–5.1)
SODIUM: 141 meq/L (ref 135–145)
TOTAL PROTEIN: 7.6 g/dL (ref 6.0–8.3)
Total Bilirubin: 0.7 mg/dL (ref 0.2–1.2)

## 2018-04-06 LAB — CBC WITH DIFFERENTIAL/PLATELET
Basophils Absolute: 0.1 10*3/uL (ref 0.0–0.1)
Basophils Relative: 0.9 % (ref 0.0–3.0)
Eosinophils Absolute: 0.1 10*3/uL (ref 0.0–0.7)
Eosinophils Relative: 1.6 % (ref 0.0–5.0)
HCT: 46 % (ref 36.0–46.0)
Hemoglobin: 15.5 g/dL — ABNORMAL HIGH (ref 12.0–15.0)
Lymphocytes Relative: 17.1 % (ref 12.0–46.0)
Lymphs Abs: 1 10*3/uL (ref 0.7–4.0)
MCHC: 33.7 g/dL (ref 30.0–36.0)
MCV: 87.3 fl (ref 78.0–100.0)
Monocytes Absolute: 0.4 10*3/uL (ref 0.1–1.0)
Monocytes Relative: 7 % (ref 3.0–12.0)
Neutro Abs: 4.3 10*3/uL (ref 1.4–7.7)
Neutrophils Relative %: 73.4 % (ref 43.0–77.0)
Platelets: 237 10*3/uL (ref 150.0–400.0)
RBC: 5.27 Mil/uL — ABNORMAL HIGH (ref 3.87–5.11)
RDW: 13.3 % (ref 11.5–15.5)
WBC: 5.9 10*3/uL (ref 4.0–10.5)

## 2018-04-06 NOTE — Progress Notes (Signed)
Chief Complaint:    Recurrent upper abdominal pain and rectal bleeding  IMPRESSION and PLAN:    17.  72 year old [place] female who is status post gastric resection for a GIST.  She is followed by Dr. Carlean Purl for chronic epigastric pain and had been doing well on nortriptyline until approximately 10 days ago.  Now with recurrent intermittent, nonradiating epigastric pain which is worse with eating.  She describes the pain as being the exact same as previous episodes.  No really tender on exam. Unclear why the recurrence of pain, she has been compliant with both nortriptyline and PPI -obtain some basic labs including CBC, CMET -will call daughter Salli Real at [phone] with results.   2. Intermittent painless rectal bleeding. Small inflamed internal hemorrhoid on exam, probably source of bleeding. She was somewhat tender in the posterior midline at the start of DRE but I could not appreciate a fissure.  There was stool in the vault precluding an adequate exam however -Trial of Preparation H cream.  Explained how to administer inside the rectum.  She will use it every night for 7 days. -Consider colonoscopy at some point, I do not know that she has ever had one.  For now, focus is evaluation and treatment of #1  3.  Excessive fatigue.  This is nonspecific. -Await lab results      HPI:     Patient is a non-English-speaking,  72 year old [place] female known to Dr. Carlean Purl.  She has a history of gastric resection for a GIST and is followed here for chronic epigastric pain for which we have her on amitriptyline.  At her last visit here January 2018 she was doing well on amitriptyline.  Ten days ago she developed recurrent epigastric pain.  Pain nonradiating. It is intermittent, seems worse in the a.m. and eating definitely aggravates the pain such as she is scared to eat.  Weight down but only by a couple of pounds.  No fevers.  She does admit to associated nausea. She is taking the  Amitriptyline and pantoprazole every day.  In addition to the recurrent epigastric pain patient has recently started seeing bright red blood in her stools.  She denies rectal pain, no constipation/straining.  She complains of excessive fatigue   Review of systems:     No chest pain, no SOB, no fevers, no dysuria.   Past Medical History:  Diagnosis Date  . Acute pancreatitis after ERCP  01/22/2012  . Arthritis   . EPIGASTRIC PAIN, CHRONIC 03/22/2008       . 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 GERD (gastroesophageal reflux disease)   . gist dx'd 02/2007   gleevac comp  . Hypertension   . Internal hemorrhoids   . Vertigo     Patient's surgical history, family medical history, social history, medications and allergies were all reviewed in Epic   Creatinine clearance cannot be calculated (Patient's most recent lab result is older than the maximum 21 days allowed.)  Current Outpatient Medications  Medication Sig Dispense Refill  . amitriptyline (ELAVIL) 50 MG tablet TAKE 1 TABLET(50 MG) BY MOUTH AT BEDTIME 90 tablet 1  . amLODipine (NORVASC) 10 MG tablet Take 10 mg by mouth daily.      . pantoprazole (PROTONIX) 40 MG tablet Take 1 tablet (40 mg total) by mouth daily. 30 tablet 11   No current facility-administered medications for this visit.     Physical Exam:  BP 110/70 (BP Location: Left Arm, Patient Position: Sitting, Cuff Size: Normal)   Pulse 96   Ht 4' 11.5" (1.511 m) Comment: height measured without shoes  Wt 93 lb 8 oz (42.4 kg)   BMI 18.57 kg/m   GENERAL:  Pleasant female in NAD PSYCH: : Cooperative, normal affect EENT:  conjunctiva pink, mucous membranes moist, neck supple without masses CARDIAC:  RRR, no murmur heard, no peripheral edema PULM: Normal respiratory effort, lungs CTA bilaterally, no wheezing ABDOMEN:  Nondistended, soft, nontender. No obvious masses,   normal bowel sounds Rectal: No external lesions.  Mild discomfort at posterior midline at start of DRE. Stool light brown. On anoscopy there was one mildly inflamed hemorrhoid. I didn't appreciate any fissures.  SKIN:  turgor, no lesions seen Musculoskeletal:  Normal muscle tone, normal strength NEURO: Alert and oriented x 3, no focal neurologic deficits   Tye Savoy , NP 04/06/2018, 11:22 AM

## 2018-04-06 NOTE — Patient Instructions (Addendum)
Preparation H apply inside at bedtime for 1 week.   Your provider has requested that you go to the basement level for lab work before leaving today. Press "B" on the elevator. The lab is located at the first door on the left as you exit the elevator.

## 2018-04-07 ENCOUNTER — Other Ambulatory Visit: Payer: Self-pay

## 2018-04-07 ENCOUNTER — Ambulatory Visit (HOSPITAL_COMMUNITY): Payer: Medicare Other | Attending: Internal Medicine

## 2018-04-07 DIAGNOSIS — R0609 Other forms of dyspnea: Secondary | ICD-10-CM | POA: Diagnosis not present

## 2018-04-07 DIAGNOSIS — R0600 Dyspnea, unspecified: Secondary | ICD-10-CM

## 2018-04-11 ENCOUNTER — Encounter: Payer: Self-pay | Admitting: Nurse Practitioner

## 2018-05-01 DIAGNOSIS — M810 Age-related osteoporosis without current pathological fracture: Secondary | ICD-10-CM | POA: Diagnosis not present

## 2018-06-08 ENCOUNTER — Other Ambulatory Visit: Payer: Self-pay | Admitting: Internal Medicine

## 2018-06-30 ENCOUNTER — Encounter: Payer: Self-pay | Admitting: Internal Medicine

## 2018-06-30 DIAGNOSIS — K625 Hemorrhage of anus and rectum: Secondary | ICD-10-CM | POA: Insufficient documentation

## 2018-07-10 ENCOUNTER — Telehealth: Payer: Self-pay | Admitting: *Deleted

## 2018-07-10 NOTE — Telephone Encounter (Signed)
Covid-19 travel screening questions  Have you traveled in the last 14 days? no If yes where?  Do you now or have you had a fever in the last 14 days? no  Do you have any respiratory symptoms of shortness of breath or cough now or in the last 14 days? no  Do you have a medical history of Congestive Heart Failure? no  Do you have a medical history of lung disease? no  Do you have any family members or close contacts with diagnosed or suspected Covid-19? No  Spoke with pt and she was able to answer all questions appropriately

## 2018-07-11 ENCOUNTER — Ambulatory Visit (AMBULATORY_SURGERY_CENTER): Payer: Self-pay | Admitting: *Deleted

## 2018-07-11 ENCOUNTER — Other Ambulatory Visit: Payer: Self-pay

## 2018-07-11 ENCOUNTER — Telehealth: Payer: Self-pay

## 2018-07-11 ENCOUNTER — Telehealth: Payer: Self-pay | Admitting: *Deleted

## 2018-07-11 VITALS — Temp 97.7°F | Ht 59.0 in | Wt 96.0 lb

## 2018-07-11 DIAGNOSIS — G8929 Other chronic pain: Secondary | ICD-10-CM

## 2018-07-11 DIAGNOSIS — R1013 Epigastric pain: Secondary | ICD-10-CM

## 2018-07-11 DIAGNOSIS — K625 Hemorrhage of anus and rectum: Secondary | ICD-10-CM

## 2018-07-11 NOTE — Telephone Encounter (Signed)
This patient should be seen for an EGD  But if her symptoms are gone we can cancel it  Please get a weight also

## 2018-07-11 NOTE — Telephone Encounter (Deleted)
Previous weight 93.8 lbs

## 2018-07-11 NOTE — Telephone Encounter (Signed)
Pt. Being seen today in pv, for colonscopy,unclear if this is the procedure to be done or if pt. Needs double,in office visit  Pt.was seen for epigastric pain,please advise.  Has h/o gist.

## 2018-07-11 NOTE — Telephone Encounter (Signed)
-----   Message from Gatha Mayer, MD sent at 07/11/2018 10:59 AM EDT ----- Regarding: Recall colonoscopy Place a recall for July 2020

## 2018-07-11 NOTE — Telephone Encounter (Addendum)
Dr. Carlean Purl pt temp-97.7. Weight is 96 lbs, previous weight ( 93.8) height 59.25 inch,states "no pain,she is eating well,taking  Amitriptyline 50mg   as needed usually 3x a week,taking protonix 40 mg daily as directed.denies rectal bleeding

## 2018-07-11 NOTE — Telephone Encounter (Signed)
Recall changed from 05/2017 to 10/2018 for colonoscopy per Dr Carlean Purl.

## 2018-07-11 NOTE — Telephone Encounter (Signed)
EGD does not need to be done. It was cancelled.

## 2018-07-11 NOTE — Progress Notes (Signed)
y hin interpretor present .See telephone note.pt.without pain weight gain of 3lbs,denies rectal bleeding. Dr. Carlean Purl instructed pt. To take amitryplline daily,pt. Verbalize understanding.pt.also instructed that if she starts having abdominal pain contact the office and schedule appointment,explained to pt.that since she is no longer having pain that procedure for 07/21/18 is cancelled,she verbalize understanding.

## 2018-07-21 ENCOUNTER — Encounter: Payer: Medicare Other | Admitting: Internal Medicine

## 2018-11-01 ENCOUNTER — Other Ambulatory Visit: Payer: Self-pay | Admitting: Internal Medicine

## 2018-11-01 DIAGNOSIS — Z1231 Encounter for screening mammogram for malignant neoplasm of breast: Secondary | ICD-10-CM

## 2018-11-01 DIAGNOSIS — I10 Essential (primary) hypertension: Secondary | ICD-10-CM | POA: Diagnosis not present

## 2018-11-01 DIAGNOSIS — E7800 Pure hypercholesterolemia, unspecified: Secondary | ICD-10-CM | POA: Diagnosis not present

## 2018-11-01 DIAGNOSIS — D481 Neoplasm of uncertain behavior of connective and other soft tissue: Secondary | ICD-10-CM | POA: Diagnosis not present

## 2018-11-01 DIAGNOSIS — E440 Moderate protein-calorie malnutrition: Secondary | ICD-10-CM | POA: Diagnosis not present

## 2018-11-01 DIAGNOSIS — Z0000 Encounter for general adult medical examination without abnormal findings: Secondary | ICD-10-CM | POA: Diagnosis not present

## 2018-11-01 DIAGNOSIS — Z1389 Encounter for screening for other disorder: Secondary | ICD-10-CM | POA: Diagnosis not present

## 2018-11-01 DIAGNOSIS — M810 Age-related osteoporosis without current pathological fracture: Secondary | ICD-10-CM | POA: Diagnosis not present

## 2018-12-03 ENCOUNTER — Other Ambulatory Visit: Payer: Self-pay | Admitting: Internal Medicine

## 2018-12-04 NOTE — Telephone Encounter (Signed)
Please advise Sir, seen by [facility] December 2019.

## 2018-12-05 NOTE — Telephone Encounter (Signed)
Okay to fill Sir?

## 2018-12-06 NOTE — Telephone Encounter (Signed)
Dr Carlean Purl refilled this himself.

## 2018-12-19 DIAGNOSIS — R7309 Other abnormal glucose: Secondary | ICD-10-CM | POA: Diagnosis not present

## 2019-06-01 ENCOUNTER — Other Ambulatory Visit: Payer: Self-pay | Admitting: Internal Medicine

## 2019-06-01 NOTE — Telephone Encounter (Signed)
Could you please call and set her up an appointment and then I can refill her rx

## 2019-06-01 NOTE — Telephone Encounter (Signed)
I called the number on file--a lady with an Asian accent picked up and stated "I think you have the wrong number and hung up."

## 2019-06-01 NOTE — Telephone Encounter (Signed)
I left a message on the daughter Phoung's home # to call us and that mom needs an appointment before we can refill her medicine.

## 2019-06-01 NOTE — Telephone Encounter (Signed)
She needs an appointment to see me  She may have 1-2 mos as long as we get appt set up  ? Need Vannanh to help w/ language

## 2019-06-01 NOTE — Telephone Encounter (Signed)
May I refill Sir? 

## 2019-06-11 NOTE — Telephone Encounter (Signed)
Amitriptyline refilled and office appointment has been made for March.

## 2019-06-11 NOTE — Telephone Encounter (Signed)
Can you try to reach out to her again for me, thank you.

## 2019-07-10 ENCOUNTER — Ambulatory Visit (INDEPENDENT_AMBULATORY_CARE_PROVIDER_SITE_OTHER): Payer: Medicare Other | Admitting: Internal Medicine

## 2019-07-10 ENCOUNTER — Encounter: Payer: Self-pay | Admitting: Internal Medicine

## 2019-07-10 VITALS — BP 128/62 | HR 92 | Temp 97.8°F | Ht 59.5 in | Wt 100.2 lb

## 2019-07-10 DIAGNOSIS — R0600 Dyspnea, unspecified: Secondary | ICD-10-CM | POA: Diagnosis not present

## 2019-07-10 DIAGNOSIS — K219 Gastro-esophageal reflux disease without esophagitis: Secondary | ICD-10-CM | POA: Diagnosis not present

## 2019-07-10 DIAGNOSIS — R1013 Epigastric pain: Secondary | ICD-10-CM | POA: Diagnosis not present

## 2019-07-10 DIAGNOSIS — R0609 Other forms of dyspnea: Secondary | ICD-10-CM

## 2019-07-10 MED ORDER — PANTOPRAZOLE SODIUM 40 MG PO TBEC: 40.0000 mg | DELAYED_RELEASE_TABLET | Freq: Every day | ORAL | 3 refills | Status: DC

## 2019-07-10 MED ORDER — AMITRIPTYLINE HCL 50 MG PO TABS: 50.0000 mg | ORAL_TABLET | Freq: Every day | ORAL | 3 refills | Status: DC

## 2019-07-10 NOTE — Patient Instructions (Signed)
We have sent the following medications to your pharmacy for you to pick up at your convenience: Amitriptyline and pantoprazole   Please make an appointment with Dr Laurann Montana about your breathing issues.   I appreciate the opportunity to care for you. Silvano Rusk, MD, [facility]

## 2019-07-10 NOTE — Progress Notes (Signed)
[name] 74 y.o. May 28, 1945 [id number]  Assessment & Plan:   Encounter Diagnoses  Name Primary?  . Gastroesophageal reflux disease, unspecified whether esophagitis present Yes  . EPIGASTRIC PAIN, CHRONIC   . DOE (dyspnea on exertion)     Refill amitriptyline which is done a great job of taking care of her chronic epigastric pain status post resection of gastric GIST.  Schedule appointment with Dr. Laurann Montana for chronic DOE  RTC here 1-2 years routine  CA:[id number], John, MD   Subjective:   Chief Complaint: GERD and chronic epigastric pain, medication refills  HPI  The patient is here along with an interpreter stating that she is doing well with respect to heartburn and chronic epigastric pain.  She is on pantoprazole and amitriptyline.  She needs refills.  She was last seen in 2018. Wt Readings from Last 3 Encounters:  07/10/19 100 lb 3.2 oz (45.5 kg)  07/11/18 96 lb (43.5 kg)  04/06/18 93 lb 8 oz (42.4 kg)    As part of her review of systems it came up that she has chronic dyspnea on exertion within a few seconds or minutes and doing exertional activity.  Based upon extensive history taking using the interpreter this has been going on since she had an echocardiogram in 2019 (actually before that) that showed normal function of the heart.  She thinks she could have asthma she says because there is a heaviness in her throat at times.  There is no cough, there is no orthopnea, there is no angina with respect to pain.  The symptoms are incredibly stable since 2019 or earlier.  However they bother her.  As best I can tell she has not sought follow-up for this issue. No Known Allergies Current Meds  Medication Sig  . amitriptyline (ELAVIL) 50 MG tablet Take 1 tablet (50 mg total) by mouth at bedtime.  Marland Kitchen amLODipine (NORVASC) 10 MG tablet Take 10 mg by mouth daily.    . pantoprazole (PROTONIX) 40 MG tablet Take 1 tablet (40 mg total) by mouth daily.  Marland Kitchen VITAMIN D PO Take  1 capsule by mouth daily.  . [DISCONTINUED] amitriptyline (ELAVIL) 50 MG tablet TAKE 1 TABLET BY MOUTH EVERY NIGHT AT BEDTIME  . [DISCONTINUED] pantoprazole (PROTONIX) 40 MG tablet Take 1 tablet (40 mg total) by mouth daily.  . [DISCONTINUED] pantoprazole (PROTONIX) 40 MG tablet Take 1 tablet (40 mg total) by mouth daily.   Past Medical History:  Diagnosis Date  . Acute pancreatitis after ERCP  01/22/2012  . Arthritis   . EPIGASTRIC PAIN, CHRONIC 03/22/2008       . 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 GERD (gastroesophageal reflux disease)   . Hypertension   . Internal hemorrhoids   . Vertigo    Past Surgical History:  Procedure Laterality Date  . BREAST BIOPSY Right 2009  . CHOLECYSTECTOMY    . COLONOSCOPY    . ERCP  01/21/2012   Procedure: ENDOSCOPIC RETROGRADE CHOLANGIOPANCREATOGRAPHY (ERCP);  Surgeon: Gatha Mayer, MD;  Location: [facility];  Service: Endoscopy;  Laterality: N/A;  Needs interpreter- vietnanese  . ESOPHAGOGASTRODUODENOSCOPY    . gist resection     Social History   Social History Narrative   Widowed   1 child   Former smoker   No EtOH/drugs         Review of Systems As above  Objective:   Physical Exam BP 128/62   Pulse 92  Temp 97.8 F (36.6 C)   Ht 4' 11.5" (1.511 m)   Wt 100 lb 3.2 oz (45.5 kg)   BMI 19.90 kg/m  NAD Lungs clr NL heart sounds

## 2020-04-03 ENCOUNTER — Other Ambulatory Visit: Payer: Self-pay | Admitting: Internal Medicine

## 2020-04-03 DIAGNOSIS — Z1231 Encounter for screening mammogram for malignant neoplasm of breast: Secondary | ICD-10-CM

## 2020-04-07 ENCOUNTER — Other Ambulatory Visit: Payer: Self-pay | Admitting: *Deleted

## 2020-04-07 ENCOUNTER — Other Ambulatory Visit: Payer: Self-pay | Admitting: Internal Medicine

## 2020-04-07 DIAGNOSIS — M810 Age-related osteoporosis without current pathological fracture: Secondary | ICD-10-CM

## 2020-05-26 ENCOUNTER — Other Ambulatory Visit: Payer: Self-pay

## 2020-05-26 ENCOUNTER — Ambulatory Visit
Admission: RE | Admit: 2020-05-26 | Discharge: 2020-05-26 | Disposition: A | Discharge status: Home / Self Care | Payer: Medicare Other | Source: Ambulatory Visit | Attending: Internal Medicine | Admitting: Internal Medicine

## 2020-05-26 DIAGNOSIS — Z1231 Encounter for screening mammogram for malignant neoplasm of breast: Secondary | ICD-10-CM

## 2020-07-29 ENCOUNTER — Other Ambulatory Visit: Payer: Self-pay

## 2020-07-29 ENCOUNTER — Ambulatory Visit
Admission: RE | Admit: 2020-07-29 | Discharge: 2020-07-29 | Disposition: A | Discharge status: Home / Self Care | Payer: Medicare Other | Source: Ambulatory Visit | Attending: Internal Medicine | Admitting: Internal Medicine

## 2020-07-29 DIAGNOSIS — M810 Age-related osteoporosis without current pathological fracture: Secondary | ICD-10-CM

## 2020-08-11 ENCOUNTER — Other Ambulatory Visit: Payer: Self-pay | Admitting: Internal Medicine

## 2020-10-28 ENCOUNTER — Other Ambulatory Visit: Payer: Self-pay | Admitting: Internal Medicine

## 2020-10-28 DIAGNOSIS — I10 Essential (primary) hypertension: Secondary | ICD-10-CM

## 2020-11-05 ENCOUNTER — Other Ambulatory Visit: Payer: Self-pay

## 2020-11-05 ENCOUNTER — Inpatient Hospital Stay (HOSPITAL_COMMUNITY)
Admission: EM | Admit: 2020-11-05 | Discharge: 2020-11-13 | DRG: 034 | Disposition: A | Discharge status: Home Health | Payer: Medicare Other | Attending: Family Medicine | Admitting: Family Medicine

## 2020-11-05 ENCOUNTER — Encounter (HOSPITAL_COMMUNITY): Payer: Self-pay | Admitting: Emergency Medicine

## 2020-11-05 ENCOUNTER — Inpatient Hospital Stay (HOSPITAL_COMMUNITY): Payer: Medicare Other

## 2020-11-05 ENCOUNTER — Emergency Department (HOSPITAL_COMMUNITY): Payer: Medicare Other

## 2020-11-05 ENCOUNTER — Ambulatory Visit
Admission: RE | Admit: 2020-11-05 | Discharge: 2020-11-05 | Disposition: A | Discharge status: Home / Self Care | Payer: Medicare Other | Source: Ambulatory Visit | Attending: Internal Medicine | Admitting: Internal Medicine

## 2020-11-05 DIAGNOSIS — R7303 Prediabetes: Secondary | ICD-10-CM | POA: Diagnosis present

## 2020-11-05 DIAGNOSIS — Z681 Body mass index (BMI) 19 or less, adult: Secondary | ICD-10-CM | POA: Diagnosis not present

## 2020-11-05 DIAGNOSIS — Z560 Unemployment, unspecified: Secondary | ICD-10-CM

## 2020-11-05 DIAGNOSIS — E785 Hyperlipidemia, unspecified: Secondary | ICD-10-CM | POA: Diagnosis present

## 2020-11-05 DIAGNOSIS — Z8673 Personal history of transient ischemic attack (TIA), and cerebral infarction without residual deficits: Secondary | ICD-10-CM | POA: Diagnosis not present

## 2020-11-05 DIAGNOSIS — G8324 Monoplegia of upper limb affecting left nondominant side: Secondary | ICD-10-CM | POA: Diagnosis present

## 2020-11-05 DIAGNOSIS — I6521 Occlusion and stenosis of right carotid artery: Secondary | ICD-10-CM | POA: Diagnosis present

## 2020-11-05 DIAGNOSIS — H538 Other visual disturbances: Secondary | ICD-10-CM | POA: Diagnosis present

## 2020-11-05 DIAGNOSIS — E7849 Other hyperlipidemia: Secondary | ICD-10-CM | POA: Diagnosis not present

## 2020-11-05 DIAGNOSIS — I63511 Cerebral infarction due to unspecified occlusion or stenosis of right middle cerebral artery: Principal | ICD-10-CM | POA: Diagnosis present

## 2020-11-05 DIAGNOSIS — I700 Atherosclerosis of aorta: Secondary | ICD-10-CM | POA: Diagnosis present

## 2020-11-05 DIAGNOSIS — R7401 Elevation of levels of liver transaminase levels: Secondary | ICD-10-CM | POA: Diagnosis present

## 2020-11-05 DIAGNOSIS — I63231 Cerebral infarction due to unspecified occlusion or stenosis of right carotid arteries: Secondary | ICD-10-CM | POA: Diagnosis not present

## 2020-11-05 DIAGNOSIS — I10 Essential (primary) hypertension: Secondary | ICD-10-CM | POA: Diagnosis not present

## 2020-11-05 DIAGNOSIS — E7800 Pure hypercholesterolemia, unspecified: Secondary | ICD-10-CM | POA: Diagnosis not present

## 2020-11-05 DIAGNOSIS — Z20822 Contact with and (suspected) exposure to covid-19: Secondary | ICD-10-CM | POA: Diagnosis present

## 2020-11-05 DIAGNOSIS — Z87891 Personal history of nicotine dependence: Secondary | ICD-10-CM

## 2020-11-05 DIAGNOSIS — Z85831 Personal history of malignant neoplasm of soft tissue: Secondary | ICD-10-CM | POA: Diagnosis not present

## 2020-11-05 DIAGNOSIS — Z79899 Other long term (current) drug therapy: Secondary | ICD-10-CM

## 2020-11-05 DIAGNOSIS — Z9049 Acquired absence of other specified parts of digestive tract: Secondary | ICD-10-CM | POA: Diagnosis not present

## 2020-11-05 DIAGNOSIS — I639 Cerebral infarction, unspecified: Secondary | ICD-10-CM | POA: Diagnosis not present

## 2020-11-05 DIAGNOSIS — R29702 NIHSS score 2: Secondary | ICD-10-CM | POA: Diagnosis present

## 2020-11-05 DIAGNOSIS — I2510 Atherosclerotic heart disease of native coronary artery without angina pectoris: Secondary | ICD-10-CM | POA: Diagnosis not present

## 2020-11-05 DIAGNOSIS — G459 Transient cerebral ischemic attack, unspecified: Secondary | ICD-10-CM

## 2020-11-05 DIAGNOSIS — K219 Gastro-esophageal reflux disease without esophagitis: Secondary | ICD-10-CM | POA: Diagnosis present

## 2020-11-05 DIAGNOSIS — E43 Unspecified severe protein-calorie malnutrition: Secondary | ICD-10-CM | POA: Diagnosis present

## 2020-11-05 DIAGNOSIS — Z006 Encounter for examination for normal comparison and control in clinical research program: Secondary | ICD-10-CM

## 2020-11-05 DIAGNOSIS — I6389 Other cerebral infarction: Secondary | ICD-10-CM | POA: Diagnosis not present

## 2020-11-05 LAB — RAPID URINE DRUG SCREEN, HOSP PERFORMED
Amphetamines: NOT DETECTED
Barbiturates: NOT DETECTED
Benzodiazepines: NOT DETECTED
Cocaine: NOT DETECTED
Opiates: NOT DETECTED
Tetrahydrocannabinol: NOT DETECTED

## 2020-11-05 LAB — COMPREHENSIVE METABOLIC PANEL
ALT: 20 U/L (ref 0–44)
AST: 23 U/L (ref 15–41)
Albumin: 4.6 g/dL (ref 3.5–5.0)
Alkaline Phosphatase: 73 U/L (ref 38–126)
Anion gap: 12 (ref 5–15)
BUN: 15 mg/dL (ref 8–23)
CO2: 22 mmol/L (ref 22–32)
Calcium: 9.5 mg/dL (ref 8.9–10.3)
Chloride: 108 mmol/L (ref 98–111)
Creatinine, Ser: 0.67 mg/dL (ref 0.44–1.00)
GFR, Estimated: >60 mL/min (ref >60)
Glucose, Bld: 125 mg/dL — ABNORMAL HIGH (ref 70–99)
Potassium: 3.4 mmol/L — ABNORMAL LOW (ref 3.5–5.1)
Sodium: 142 mmol/L (ref 135–145)
Total Bilirubin: 0.9 mg/dL (ref 0.3–1.2)
Total Protein: 7.9 g/dL (ref 6.5–8.1)

## 2020-11-05 LAB — DIFFERENTIAL
Abs Immature Granulocytes: 0.03 10*3/uL (ref 0.00–0.07)
Basophils Absolute: 0 10*3/uL (ref 0.0–0.1)
Basophils Relative: 0 %
Eosinophils Absolute: 0.1 10*3/uL (ref 0.0–0.5)
Eosinophils Relative: 1 %
Immature Granulocytes: 0 %
Lymphocytes Relative: 17 %
Lymphs Abs: 1.4 10*3/uL (ref 0.7–4.0)
Monocytes Absolute: 0.6 10*3/uL (ref 0.1–1.0)
Monocytes Relative: 8 %
Neutro Abs: 6.1 10*3/uL (ref 1.7–7.7)
Neutrophils Relative %: 74 %

## 2020-11-05 LAB — CBC
HCT: 45 % (ref 36.0–46.0)
Hemoglobin: 15.4 g/dL — ABNORMAL HIGH (ref 12.0–15.0)
MCH: 29.6 pg (ref 26.0–34.0)
MCHC: 34.2 g/dL (ref 30.0–36.0)
MCV: 86.4 fL (ref 80.0–100.0)
Platelets: 213 10*3/uL (ref 150–400)
RBC: 5.21 MIL/uL — ABNORMAL HIGH (ref 3.87–5.11)
RDW: 12.1 % (ref 11.5–15.5)
WBC: 8.2 10*3/uL (ref 4.0–10.5)
nRBC: 0 % (ref 0.0–0.2)

## 2020-11-05 LAB — URINALYSIS, ROUTINE W REFLEX MICROSCOPIC
Bacteria, UA: NONE SEEN
Bilirubin Urine: NEGATIVE
Glucose, UA: NEGATIVE mg/dL
Hgb urine dipstick: NEGATIVE
Ketones, ur: NEGATIVE mg/dL
Leukocytes,Ua: NEGATIVE
Nitrite: NEGATIVE
Protein, ur: NEGATIVE mg/dL
Specific Gravity, Urine: 1.008 (ref 1.005–1.030)
pH: 6 (ref 5.0–8.0)

## 2020-11-05 LAB — RESP PANEL BY RT-PCR (FLU A&B, COVID) ARPGX2
Influenza A by PCR: NEGATIVE
Influenza B by PCR: NEGATIVE
SARS Coronavirus 2 by RT PCR: NEGATIVE

## 2020-11-05 LAB — PROTIME-INR
INR: 1 (ref 0.8–1.2)
Prothrombin Time: 13 seconds (ref 11.4–15.2)

## 2020-11-05 LAB — APTT: aPTT: 33 seconds (ref 24–36)

## 2020-11-05 MED ORDER — ASPIRIN EC 81 MG PO TBEC
81.0000 mg | DELAYED_RELEASE_TABLET | Freq: Every day | ORAL | Status: DC
  Administered 2020-11-06 – 2020-11-13 (×8): 81 mg via ORAL
  Filled 2020-11-05 (×8): qty 1

## 2020-11-05 MED ORDER — IOHEXOL 350 MG/ML SOLN
75.0000 mL | Freq: Once | INTRAVENOUS | Status: AC | PRN
  Administered 2020-11-05: 75 mL via INTRAVENOUS

## 2020-11-05 MED ORDER — ENOXAPARIN SODIUM 30 MG/0.3ML IJ SOSY
30.0000 mg | PREFILLED_SYRINGE | INTRAMUSCULAR | Status: DC
  Administered 2020-11-05 – 2020-11-12 (×8): 30 mg via SUBCUTANEOUS
  Filled 2020-11-05 (×8): qty 0.3

## 2020-11-05 MED ORDER — ACETAMINOPHEN 325 MG PO TABS
650.0000 mg | ORAL_TABLET | ORAL | Status: DC | PRN
  Administered 2020-11-06 (×2): 650 mg via ORAL
  Filled 2020-11-05 (×2): qty 2

## 2020-11-05 MED ORDER — CLOPIDOGREL BISULFATE 75 MG PO TABS
75.0000 mg | ORAL_TABLET | Freq: Once | ORAL | Status: AC
  Administered 2020-11-05: 75 mg via ORAL
  Filled 2020-11-05: qty 1

## 2020-11-05 MED ORDER — SENNOSIDES-DOCUSATE SODIUM 8.6-50 MG PO TABS
1.0000 | ORAL_TABLET | Freq: Every evening | ORAL | Status: DC | PRN
  Filled 2020-11-05: qty 1

## 2020-11-05 MED ORDER — SODIUM CHLORIDE 0.9 % IV SOLN: INTRAVENOUS | Status: DC

## 2020-11-05 MED ORDER — ASPIRIN 325 MG PO TABS
325.0000 mg | ORAL_TABLET | Freq: Every day | ORAL | Status: DC
  Administered 2020-11-05: 325 mg via ORAL
  Filled 2020-11-05: qty 1

## 2020-11-05 MED ORDER — ACETAMINOPHEN 160 MG/5ML PO SOLN: 650.0000 mg | ORAL | Status: DC | PRN

## 2020-11-05 MED ORDER — GADOBENATE DIMEGLUMINE 529 MG/ML IV SOLN
10.0000 mL | Freq: Once | INTRAVENOUS | Status: AC | PRN
  Administered 2020-11-05: 10 mL via INTRAVENOUS

## 2020-11-05 MED ORDER — ACETAMINOPHEN 650 MG RE SUPP
650.0000 mg | RECTAL | Status: DC | PRN
  Filled 2020-11-05: qty 1

## 2020-11-05 MED ORDER — STROKE: EARLY STAGES OF RECOVERY BOOK
Freq: Once | Status: DC
  Filled 2020-11-05: qty 1

## 2020-11-05 NOTE — ED Triage Notes (Signed)
Patient BIB daughter, reports sent for further evaluation after MRI results today showed subacute infarcts. Daughter reports headache and L arm numbness x1 month.

## 2020-11-05 NOTE — H&P (Signed)
[facility] [place] [facility] Admission History and Physical Harvie Bridge, D.O.  ---------------------------------------------------------------------------------------------------------------------   PATIENT NAME: [name] MR#: [id number] DATE OF BIRTH: 28-Jul-1945 DATE OF ADMISSION: 11/05/2020 PRIMARY CARE PHYSICIAN: Lavone Orn, MD  REQUESTING/REFERRING PHYSICIAN: ED. Dr. Melina Copa  CHIEF COMPLAINT: Chief Complaint  Patient presents with   Abnormal Lab  Please note the entire history is obtained from the patient's emergency department chart, emergency department provider and the patient's family who is at the bedside. Patient's personal history is limited by language barrier - daughter provides translation.    HISTORY OF PRESENT ILLNESS: [name] is a 75 y.o. female with a known history of hypertension, GERD, vertigo was in a usual state of health until 1 month ago when she developed sudden onset of headache associated with left arm weakness and bilateral lower extremity weakness.  She was seen by her primary care doctor who ordered an MRI as an outpatient which was done today and showed multiple subacute strokes.  She reports some continued numbness in her left arm as well as global weakness of her legs and some mild gait instability but does not report any falls.  Daughter states that her speech is clear but that her voice sounds different.  Otherwise there has been no change in status. Patient has been taking medication as prescribed and there has been no recent change in medication or diet.  There has been no recent illness, travel or sick contacts.    Patient denies fevers/chills, weakness, dizziness, chest pain, shortness of breath, N/V/C/D, abdominal pain, dysuria/frequency, changes in mental status.   EMS/ED COURSE: Patient received aspirin and Plavix.  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, 2007   S/P Wedge resection   GERD (gastroesophageal reflux disease)    Hypertension    Internal hemorrhoids    Vertigo       PAST SURGICAL HISTORY: Past Surgical History:  Procedure Laterality Date   BREAST BIOPSY Right 2009   CHOLECYSTECTOMY     COLONOSCOPY     ERCP  01/21/2012   Procedure: ENDOSCOPIC RETROGRADE CHOLANGIOPANCREATOGRAPHY (ERCP);  Surgeon: Gatha Mayer, MD;  Location: [facility];  Service: Endoscopy;  Laterality: N/A;  Needs interpreter- vietnanese   ESOPHAGOGASTRODUODENOSCOPY     gist resection        SOCIAL HISTORY: Social History   Tobacco Use   Smoking status: Former    Packs/day: 0.50    Years: 40.00    Pack years: 20.00    Types: Cigarettes    Quit date: 04/07/2003    Years since quitting: 17.5   Smokeless tobacco: Never  Substance Use Topics   Alcohol use: No      FAMILY HISTORY: Family History  Problem Relation Age of Onset   Colon cancer Neg Hx      MEDICATIONS AT HOME: Prior to Admission medications   Medication Sig Start Date End Date Taking? Authorizing Provider  amitriptyline (ELAVIL) 50 MG tablet TAKE 1 TABLET(50 MG) BY MOUTH AT BEDTIME 08/11/20   Gatha Mayer, MD  amLODipine (NORVASC) 10 MG tablet Take 10 mg by mouth daily.      [provider]  pantoprazole (PROTONIX) 40 MG tablet TAKE 1 TABLET(40 MG) BY MOUTH DAILY 08/11/20  Gatha Mayer, MD  VITAMIN D PO Take 1 capsule by mouth daily.    [provider]      DRUG ALLERGIES: No Known Allergies   REVIEW OF SYSTEMS: CONSTITUTIONAL: No fever/chills, fatigue, weakness, weight gain/loss, headache EYES: No blurry or double vision. ENT: No tinnitus, postnasal drip, redness or soreness of the oropharynx. RESPIRATORY: No cough, wheeze, hemoptysis, dyspnea. CARDIOVASCULAR: No chest  pain, orthopnea, palpitations, syncope. GASTROINTESTINAL: No nausea, vomiting, constipation, diarrhea, abdominal pain, hematemesis, melena or hematochezia. GENITOURINARY: No dysuria or hematuria. ENDOCRINE: No polyuria or nocturia. No heat or cold intolerance. HEMATOLOGY: No anemia, bruising, bleeding. INTEGUMENTARY: No rashes, ulcers, lesions. MUSCULOSKELETAL: No arthritis, swelling, gout. NEUROLOGIC: Positive left upper extremity numbness, bilateral lower extremity weakness No seizure-type activity. PSYCHIATRIC: No anxiety, depression, insomnia.  PHYSICAL EXAMINATION: VITAL SIGNS: Blood pressure (!) 108/93, pulse 79, temperature 98.3 F (36.8 C), temperature source Oral, resp. rate (!) 22, SpO2 98 %.  GENERAL: 75 y.o.-year-old [place] speaking female patient, well-developed, well-nourished lying in the bed in no acute distress.  Pleasant and cooperative.   HEENT: Head atraumatic, normocephalic. Pupils equal, round, reactive to light and accommodation. No scleral icterus. Extraocular muscles intact. Nares are patent. Oropharynx is clear. Mucus membranes moist. NECK: Supple, full range of motion. No JVD, no bruit heard. No thyroid enlargement, no tenderness, no cervical lymphadenopathy. CHEST: Normal breath sounds bilaterally. No wheezing, rales, rhonchi or crackles. No use of accessory muscles of respiration.  No reproducible chest wall tenderness.  CARDIOVASCULAR: S1, S2 normal. No murmurs, rubs, or gallops. Cap refill <2 seconds. ABDOMEN: Soft, nontender, nondistended. No rebound, guarding, rigidity. Normoactive bowel sounds present in all four quadrants. No organomegaly or mass. EXTREMITIES: Full range of motion. No pedal edema, cyanosis, or clubbing. NEUROLOGIC: Cranial nerves II through XII are grossly intact with no focal sensorimotor deficit.  Left upper extremity weakness, strength 3/5, right upper extremity strength 5/5. PSYCHIATRIC: The patient is alert and oriented x 3. Normal  affect, mood, thought content. SKIN: Warm, dry, and intact without obvious rash, lesion, or ulcer.  LABORATORY PANEL:  CBC Recent Labs  Lab 11/05/20 1731  WBC 8.2  HGB 15.4*  HCT 45.0  PLT 213   ----------------------------------------------------------------------------------------------------------------- Chemistries Recent Labs  Lab 11/05/20 1731  NA 142  K 3.4*  CL 108  CO2 22  GLUCOSE 125*  BUN 15  CREATININE 0.67  CALCIUM 9.5  AST 23  ALT 20  ALKPHOS 73  BILITOT 0.9   ------------------------------------------------------------------------------------------------------------------ Cardiac Enzymes No results for input(s): TROPONINI in the last 168 hours. ------------------------------------------------------------------------------------------------------------------  RADIOLOGY: MR BRAIN W WO CONTRAST  Result Date: 11/05/2020 CLINICAL DATA:  Essential hypertension.  Headache. EXAM: MRI HEAD WITHOUT AND WITH CONTRAST TECHNIQUE: Multiplanar, multiecho pulse sequences of the brain and surrounding structures were obtained without and with intravenous contrast. CONTRAST:  89mL MULTIHANCE GADOBENATE DIMEGLUMINE 529 MG/ML IV SOLN COMPARISON:  06/09/2008 FINDINGS: Brain: There are multiple small regions of diffusion weighted signal abnormality in the right frontal and parietal lobes involving white matter of the centrum semiovale as well as cortex and subcortical white matter towards the vertex compatible with subacute infarcts (MCA and deep watershed territory). There is associated gyriform enhancement most notable in the parietal lobe where there is also a small focus of petechial hemorrhage. Small T2 hyperintensities elsewhere in the cerebral white matter bilaterally are nonspecific but compatible with mild chronic small vessel ischemic disease. There is mild cerebral atrophy. No mass, midline shift, or extra-axial fluid collection is identified. Vascular: Major intracranial  vascular  flow voids are preserved. Skull and upper cervical spine: Unremarkable bone marrow signal. Sinuses/Orbits: Bilateral cataract extraction. Paranasal sinuses and mastoid air cells are clear. Other: None. IMPRESSION: 1. Subacute right MCA infarcts. 2. Mild chronic small vessel ischemic disease. Electronically Signed   By: Logan Bores M.D.   On: 11/05/2020 13:36      IMPRESSION AND PLAN:  This is a 75 y.o. female with a history of hypertension and GERD now being admitted with:  1.  Subacute right MCA infarct - Admit telemetry observation for neuro workup including: - Studies: MRA/MRI, Echo, Carotids - Labs: CBC, BMP, Lipids, TFTs, A1C - Nursing: Neurochecks, O2, dysphagia screen, permissive hypertension.  - Consults: Neurology, PT/OT, S/S consults.  - Meds: Daily aspirin 81mg .   - Fluids: IVNS@75cc /hr.   - Routine DVT Px: with Lovenox, SCDs, early ambulation  2.  History of GERD - Continue Protonix  Code Status: Full  All the records are reviewed and case discussed with ED provider. Management plans discussed with the patient and/or family who express understanding and agree with plan of care.   TOTAL TIME TAKING CARE OF THIS PATIENT: 60 minutes.   [employer] D.O. on 11/05/2020 at 7:25 PM Office [phone] CC: Primary care physician; Lavone Orn, MD     Note: This dictation was prepared with Dragon dictation along with smaller phrase technology. Any transcriptional errors that result from this process are unintentional.

## 2020-11-05 NOTE — ED Provider Notes (Signed)
[facility] DEPT Provider Note   CSN: [id number] Arrival date & time: 11/05/20  1649     History Chief Complaint  Patient presents with   Abnormal Lab    [name] is a 75 y.o. female.  She has obtained by patient's daughter.  She has had 1 month of headache, left arm weakness, bilateral leg weakness.  She saw her primary care doctor about a week ago and he ordered her an outpatient MRI.  Today they received a phone call that the MRI showed multiple subacute strokes.  She has some numbness in her left arm.  Some weakness in her left arm.  Both legs weak.  No recent falls.  Not on blood thinners.  The history is provided by the patient and a relative. The history is limited by a language barrier. A language interpreter was used (family).  Cerebrovascular Accident This is a new problem. Episode onset: 1 month. The problem has not changed since onset.Associated symptoms include headaches. Pertinent negatives include no chest pain, no abdominal pain and no shortness of breath. The symptoms are aggravated by walking. Nothing relieves the symptoms. She has tried rest for the symptoms. The treatment provided no relief.      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   GERD (gastroesophageal reflux disease)    Hypertension    Internal hemorrhoids    Vertigo     Patient Active Problem List   Diagnosis Date Noted   Rectal bleeding 06/30/2018   Palpitations 10/19/2016   Lightheadedness 10/19/2016   GERD (gastroesophageal reflux disease) 10/12/2012   Dilated extrahepatic bile ducts 01/21/2012   VERTIGO 06/04/2008   HEADACHE 06/04/2008   Insomnia 04/01/2008   EPIGASTRIC PAIN, CHRONIC 03/22/2008   PERSONAL HISTORY GIST,  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     Family History  Problem Relation Age of Onset   Colon cancer Neg Hx     Social History   Tobacco Use   Smoking status: Former    Packs/day: 0.50    Years: 40.00    Pack years: 20.00    Types: Cigarettes    Quit date: 04/07/2003    Years since quitting: 17.5   Smokeless tobacco: Never  Substance Use Topics   Alcohol use: No   Drug use: No    Home Medications Prior to Admission medications   Medication Sig Start Date End Date Taking? Authorizing Provider  amitriptyline (ELAVIL) 50 MG tablet TAKE 1 TABLET(50 MG) BY MOUTH AT BEDTIME 08/11/20   Gatha Mayer, MD  amLODipine (NORVASC) 10 MG tablet Take 10 mg by mouth daily.      [provider]  pantoprazole (PROTONIX) 40 MG tablet TAKE 1 TABLET(40 MG) BY MOUTH DAILY 08/11/20   Gatha Mayer, MD  VITAMIN D PO Take 1  capsule by mouth daily.    [provider]    Allergies    Patient has no known allergies.  Review of Systems   Review of Systems  Constitutional:  Negative for fever.  HENT:  Negative for trouble swallowing.   Eyes:  Negative for visual disturbance.  Respiratory:  Negative for shortness of breath.   Cardiovascular:  Negative for chest pain.  Gastrointestinal:  Negative for abdominal pain.  Genitourinary:  Negative for dysuria.  Musculoskeletal:  Positive for gait problem.  Skin:  Negative for rash.  Neurological:  Positive for weakness, numbness and headaches.   Physical Exam Updated Vital Signs BP (!) 155/117   Pulse 93   Temp 98.3 F (36.8 C) (Oral)   Resp 16   SpO2  97%   Physical Exam Vitals and nursing note reviewed.  Constitutional:      General: She is not in acute distress.    Appearance: Normal appearance. She is well-developed.  HENT:     Head: Normocephalic and atraumatic.  Eyes:     Conjunctiva/sclera: Conjunctivae normal.  Cardiovascular:     Rate and Rhythm: Normal rate and regular rhythm.     Heart sounds: No murmur heard. Pulmonary:     Effort: Pulmonary effort is normal. No respiratory distress.     Breath sounds: Normal breath sounds.  Abdominal:     Palpations: Abdomen is soft.     Tenderness: There is no abdominal tenderness.  Musculoskeletal:        General: No deformity or signs of injury. Normal range of motion.     Cervical back: Neck supple.  Skin:    General: Skin is warm and dry.  Neurological:     Mental Status: She is alert.     GCS: GCS eye subscore is 4. GCS verbal subscore is 5. GCS motor subscore is 6.     Cranial Nerves: Cranial nerves are intact. No cranial nerve deficit.     Sensory: Sensory deficit present.     Motor: Weakness present.     Comments: She has decreased sensation left arm compared to right.  Her left arm strength is 4 out of 5 compared to right 5 out of 5.  Both lower extremities are 4 out of 5 in strength barely able to lift them off the bed.  Gait deferred.    ED Results / Procedures / Treatments   Labs (all labs ordered are listed, but only abnormal results are displayed) Labs Reviewed  CBC - Abnormal; Notable for the following components:      Result Value   RBC 5.21 (*)    Hemoglobin 15.4 (*)    All other components within normal limits  COMPREHENSIVE METABOLIC PANEL - Abnormal; Notable for the following components:   Potassium 3.4 (*)    Glucose, Bld 125 (*)    All other components within normal limits  URINALYSIS, ROUTINE W REFLEX MICROSCOPIC - Abnormal; Notable for the following components:   Color, Urine STRAW (*)    All other components within normal limits  HEMOGLOBIN  A1C - Abnormal; Notable for the following components:   Hgb A1c MFr Bld 6.0 (*)    All other components within normal limits  LIPID PANEL - Abnormal; Notable for the following components:   Cholesterol 235 (*)    Triglycerides 186 (*)    LDL Cholesterol 139 (*)    All other components within normal limits  RESP PANEL BY RT-PCR (FLU A&B, COVID) [id number]  PROTIME-INR  APTT  DIFFERENTIAL  RAPID URINE DRUG SCREEN, HOSP PERFORMED    EKG EKG Interpretation  Date/Time:  Wednesday November 05 2020 17:37:22 EDT Ventricular Rate:  86 PR Interval:  133 QRS Duration: 73 QT Interval:  367 QTC Calculation: 439 R Axis:   83 Text Interpretation: Sinus rhythm Borderline right axis deviation No significant change since prior 6/10 Confirmed by Aletta Edouard [phone]) on 11/05/2020 5:50:02 PM  Radiology MR BRAIN W WO CONTRAST  Result Date: 11/05/2020 CLINICAL DATA:  Essential hypertension.  Headache. EXAM: MRI HEAD WITHOUT AND WITH CONTRAST TECHNIQUE: Multiplanar, multiecho pulse sequences of the brain and surrounding structures were obtained without and with intravenous contrast. CONTRAST:  70mL MULTIHANCE GADOBENATE DIMEGLUMINE 529 MG/ML IV SOLN COMPARISON:  06/09/2008 FINDINGS: Brain: There are multiple small regions of diffusion weighted signal abnormality in the right frontal and parietal lobes involving white matter of the centrum semiovale as well as cortex and subcortical white matter towards the vertex compatible with subacute infarcts (MCA and deep watershed territory). There is associated gyriform enhancement most notable in the parietal lobe where there is also a small focus of petechial hemorrhage. Small T2 hyperintensities elsewhere in the cerebral white matter bilaterally are nonspecific but compatible with mild chronic small vessel ischemic disease. There is mild cerebral atrophy. No mass, midline shift, or extra-axial fluid collection is identified. Vascular: Major intracranial vascular flow  voids are preserved. Skull and upper cervical spine: Unremarkable bone marrow signal. Sinuses/Orbits: Bilateral cataract extraction. Paranasal sinuses and mastoid air cells are clear. Other: None. IMPRESSION: 1. Subacute right MCA infarcts. 2. Mild chronic small vessel ischemic disease. Electronically Signed   By: Logan Bores M.D.   On: 11/05/2020 13:36    Procedures Procedures   Medications Ordered in ED Medications   stroke: mapping our early stages of recovery book ( Does not apply Not Given 11/05/20 2131)  0.9 %  sodium chloride infusion ( Intravenous New Bag/Given 11/05/20 2129)  acetaminophen (TYLENOL) tablet 650 mg (650 mg Oral Given 11/06/20 0820)    Or  acetaminophen (TYLENOL) 160 MG/5ML solution 650 mg ( Per Tube See Alternative 11/06/20 0820)    Or  acetaminophen (TYLENOL) suppository 650 mg ( Rectal See Alternative 11/06/20 0820)  senna-docusate (Senokot-S) tablet 1 tablet (has no administration in time range)  enoxaparin (LOVENOX) injection 30 mg (30 mg Subcutaneous Given 11/05/20 2130)  aspirin EC tablet 81 mg (81 mg Oral Given 11/06/20 0926)  clopidogrel (PLAVIX) tablet 75 mg (75 mg Oral Given 11/05/20 1953)  iohexol (OMNIPAQUE) 350 MG/ML injection 75 mL (75 mLs Intravenous Contrast Given 11/05/20 1935)    ED Course  I have reviewed the triage vital signs and the nursing notes.  Pertinent labs & imaging results that were available during my care of the patient were reviewed by me and considered in my medical decision making (see chart for details).  Clinical Course as of 11/06/20 0944  Wed Nov 05, 2020  1757 Discussed with Dr. Quinn Axe from neuro hospitalist service.  She is recommending CT angio if no contraindications.  Load with 325 of aspirin today and 75 Plavix followed by 81 aspirin after that.  [place] for admission. [MB]  1919 Discussed with [facility] hospitalist Dr. Ara Kussmaul who will evaluate the patient for admission. [MB]    Clinical Course User Index [MB]  Hayden Rasmussen, MD   MDM Rules/Calculators/A&P  This patient complains of headache, weakness numbness left arm, bilateral leg weakness; this involves an extensive number of treatment Options and is a complaint that carries with it a high risk of complications and Morbidity. The differential includes stroke, bleed, tumor, metabolic derangement, arrhythmia  I ordered, reviewed and interpreted labs, which included CBC with normal white count normal hemoglobin, chemistries and LFTs normal, INR normal, COVID testing normal, urinalysis without signs of infection I ordered medication oral aspirin and Plavix I ordered imaging studies which included CT angio head and neck and I independently    visualized and interpreted imaging which showed 80% stenosis right ICA Additional history obtained from patient's daughter Previous records obtained and reviewed in epic including prior MRI earlier today I consulted neurology Dr. Quinn Axe and [facility] hospitalist Dr. Ara Kussmaul and discussed lab and imaging findings  Critical Interventions: None  After the interventions stated above, I reevaluated the patient and found patient still to be symptomatic although otherwise hemodynamically stable.  She will need to be admitted to the hospital for further stroke work-up.  She and family are agreeable to plan.   Final Clinical Impression(s) / ED Diagnoses Final diagnoses:  Cerebrovascular accident (CVA), unspecified mechanism ([place])  TIA (transient ischemic attack)    Rx / DC Orders ED Discharge Orders     None        Hayden Rasmussen, MD 11/06/20 [phone]

## 2020-11-06 ENCOUNTER — Inpatient Hospital Stay (HOSPITAL_COMMUNITY): Payer: Medicare Other

## 2020-11-06 DIAGNOSIS — K219 Gastro-esophageal reflux disease without esophagitis: Secondary | ICD-10-CM

## 2020-11-06 DIAGNOSIS — I639 Cerebral infarction, unspecified: Secondary | ICD-10-CM | POA: Diagnosis not present

## 2020-11-06 DIAGNOSIS — I700 Atherosclerosis of aorta: Secondary | ICD-10-CM | POA: Diagnosis not present

## 2020-11-06 DIAGNOSIS — Z681 Body mass index (BMI) 19 or less, adult: Secondary | ICD-10-CM | POA: Diagnosis not present

## 2020-11-06 DIAGNOSIS — Z87891 Personal history of nicotine dependence: Secondary | ICD-10-CM | POA: Diagnosis not present

## 2020-11-06 DIAGNOSIS — R29702 NIHSS score 2: Secondary | ICD-10-CM | POA: Diagnosis not present

## 2020-11-06 DIAGNOSIS — I10 Essential (primary) hypertension: Secondary | ICD-10-CM | POA: Diagnosis not present

## 2020-11-06 DIAGNOSIS — I6521 Occlusion and stenosis of right carotid artery: Secondary | ICD-10-CM | POA: Diagnosis not present

## 2020-11-06 DIAGNOSIS — E7849 Other hyperlipidemia: Secondary | ICD-10-CM | POA: Diagnosis not present

## 2020-11-06 DIAGNOSIS — Z9049 Acquired absence of other specified parts of digestive tract: Secondary | ICD-10-CM | POA: Diagnosis not present

## 2020-11-06 DIAGNOSIS — R7303 Prediabetes: Secondary | ICD-10-CM | POA: Diagnosis not present

## 2020-11-06 DIAGNOSIS — H538 Other visual disturbances: Secondary | ICD-10-CM | POA: Diagnosis not present

## 2020-11-06 DIAGNOSIS — I63511 Cerebral infarction due to unspecified occlusion or stenosis of right middle cerebral artery: Secondary | ICD-10-CM | POA: Diagnosis not present

## 2020-11-06 DIAGNOSIS — E785 Hyperlipidemia, unspecified: Secondary | ICD-10-CM | POA: Diagnosis present

## 2020-11-06 DIAGNOSIS — Z85831 Personal history of malignant neoplasm of soft tissue: Secondary | ICD-10-CM | POA: Diagnosis not present

## 2020-11-06 DIAGNOSIS — E43 Unspecified severe protein-calorie malnutrition: Secondary | ICD-10-CM | POA: Diagnosis not present

## 2020-11-06 DIAGNOSIS — G8324 Monoplegia of upper limb affecting left nondominant side: Secondary | ICD-10-CM | POA: Diagnosis not present

## 2020-11-06 DIAGNOSIS — Z79899 Other long term (current) drug therapy: Secondary | ICD-10-CM | POA: Diagnosis not present

## 2020-11-06 DIAGNOSIS — Z20822 Contact with and (suspected) exposure to covid-19: Secondary | ICD-10-CM | POA: Diagnosis not present

## 2020-11-06 DIAGNOSIS — R7401 Elevation of levels of liver transaminase levels: Secondary | ICD-10-CM | POA: Diagnosis not present

## 2020-11-06 DIAGNOSIS — Z560 Unemployment, unspecified: Secondary | ICD-10-CM | POA: Diagnosis not present

## 2020-11-06 DIAGNOSIS — Z006 Encounter for examination for normal comparison and control in clinical research program: Secondary | ICD-10-CM | POA: Diagnosis not present

## 2020-11-06 LAB — LIPID PANEL
Cholesterol: 235 mg/dL — ABNORMAL HIGH (ref 0–200)
HDL: 59 mg/dL (ref >40)
LDL Cholesterol: 139 mg/dL — ABNORMAL HIGH (ref 0–99)
Total CHOL/HDL Ratio: 4 RATIO
Triglycerides: 186 mg/dL — ABNORMAL HIGH (ref <150)
VLDL: 37 mg/dL (ref 0–40)

## 2020-11-06 LAB — HEMOGLOBIN A1C
Hgb A1c MFr Bld: 6 % — ABNORMAL HIGH (ref 4.8–5.6)
Mean Plasma Glucose: 125.5 mg/dL

## 2020-11-06 MED ORDER — MECLIZINE HCL 12.5 MG PO TABS
12.5000 mg | ORAL_TABLET | Freq: Two times a day (BID) | ORAL | Status: DC | PRN
  Administered 2020-11-08 – 2020-11-09 (×2): 12.5 mg via ORAL
  Filled 2020-11-06 (×4): qty 1

## 2020-11-06 MED ORDER — PANTOPRAZOLE SODIUM 40 MG PO TBEC
40.0000 mg | DELAYED_RELEASE_TABLET | Freq: Every morning | ORAL | Status: DC
  Administered 2020-11-07 – 2020-11-13 (×7): 40 mg via ORAL
  Filled 2020-11-06 (×7): qty 1

## 2020-11-06 MED ORDER — AMITRIPTYLINE HCL 25 MG PO TABS
50.0000 mg | ORAL_TABLET | Freq: Every day | ORAL | Status: DC
  Administered 2020-11-06: 50 mg via ORAL
  Filled 2020-11-06: qty 2

## 2020-11-06 MED ORDER — ATORVASTATIN CALCIUM 80 MG PO TABS
80.0000 mg | ORAL_TABLET | Freq: Every day | ORAL | Status: DC
  Administered 2020-11-06 – 2020-11-11 (×6): 80 mg via ORAL
  Filled 2020-11-06: qty 2
  Filled 2020-11-06 (×5): qty 1

## 2020-11-06 NOTE — ED Notes (Addendum)
Vascular at bedside to do carotid artery study

## 2020-11-06 NOTE — ED Notes (Signed)
Attempted to call report to 3W at [facility], was told RN would call me back

## 2020-11-06 NOTE — Consult Note (Signed)
Neurology Consultation  Reason for Consult: MRI brain with evidence of subacute right MCA infarcts Referring Physician: Dr. Melina Copa  CC: Left upper extremity weakness and numbness  History is obtained from: Patient via telephone interpreter, Chart Review  HPI: [name] is a 75 y.o. female with a medical history significant for hypertension, GERD, and vertigo who presented to [facility] for evaluation after outpatient MRI brain ordered by her PCP identified multiple acute strokes. Patient had an MRI brain outpatient completed by her PCP due to one month of bitemporal headaches with left upper extremity weakness and numbness. She has rated her headache severity to be 8-9/10 and notes improvement with taking acetaminophen. She has associated right eye blurry vision in the inferior visual field with worsening headaches that improves with headache improvement. Her left arm has been persistently weak and described as heavy with associated decreased sensation for at least 3 weeks prior to hospital arrival.   Modified Rankin Scale: 0-Completely asymptomatic and back to baseline post- stroke  ROS: A complete ROS was performed and is negative except as noted in the HPI.    Past Medical History:  Diagnosis Date   Acute pancreatitis after ERCP  01/22/2012   Arthritis    EPIGASTRIC PAIN, CHRONIC 03/22/2008        Gastrointestinal stromal tumor [facility]) March 18, 2007   S/P Wedge resection   GERD (gastroesophageal reflux disease)    Hypertension    Internal hemorrhoids    Vertigo    Past Surgical History:  Procedure Laterality Date   BREAST BIOPSY Right 2009   CHOLECYSTECTOMY     COLONOSCOPY     ERCP  01/21/2012   Procedure: ENDOSCOPIC RETROGRADE CHOLANGIOPANCREATOGRAPHY (ERCP);  Surgeon: Gatha Mayer, MD;  Location: [facility];  Service: Endoscopy;  Laterality: N/A;  Needs interpreter- vietnanese   ESOPHAGOGASTRODUODENOSCOPY     gist resection     Family History  Problem Relation Age  of Onset   Colon cancer Neg Hx    Social History:   reports that she quit smoking about 17 years ago. Her smoking use included cigarettes. She has a 20.00 pack-year smoking history. She has never used smokeless tobacco. She reports that she does not drink alcohol and does not use drugs.  Medications  Current Facility-Administered Medications:     stroke: mapping our early stages of recovery book, , Does not apply, Once, Hugelmeyer, Alexis, DO   0.9 %  sodium chloride infusion, , Intravenous, Continuous, Hugelmeyer, Alexis, DO, Last Rate: 75 mL/hr at 11/05/20 2129, New Bag at 11/05/20 2129   acetaminophen (TYLENOL) tablet 650 mg, 650 mg, Oral, Q4H PRN **OR** acetaminophen (TYLENOL) 160 MG/5ML solution 650 mg, 650 mg, Per Tube, Q4H PRN **OR** acetaminophen (TYLENOL) suppository 650 mg, 650 mg, Rectal, Q4H PRN, Hugelmeyer, Alexis, DO   aspirin EC tablet 81 mg, 81 mg, Oral, Daily, Hugelmeyer, Alexis, DO   enoxaparin (LOVENOX) injection 30 mg, 30 mg, Subcutaneous, Q24H, Hugelmeyer, Alexis, DO, 30 mg at 11/05/20 2130   senna-docusate (Senokot-S) tablet 1 tablet, 1 tablet, Oral, QHS PRN, Hugelmeyer, Alexis, DO  Current Outpatient Medications:    acetaminophen (TYLENOL) 325 MG tablet, Take 650 mg by mouth every 6 (six) hours as needed for mild pain or headache., Disp: , Rfl:    amitriptyline (ELAVIL) 50 MG tablet, TAKE 1 TABLET(50 MG) BY MOUTH AT BEDTIME (Patient taking differently: Take 50 mg by mouth at bedtime.), Disp: 90 tablet, Rfl: 3   amLODipine (NORVASC) 10 MG tablet, Take 10 mg by  mouth daily.  , Disp: , Rfl:    Cholecalciferol (VITAMIN D3 PO), Take 1 capsule by mouth daily with breakfast., Disp: , Rfl:    meclizine (ANTIVERT) 12.5 MG tablet, Take 12.5 mg by mouth 2 (two) times daily as needed for dizziness., Disp: , Rfl:    pantoprazole (PROTONIX) 40 MG tablet, TAKE 1 TABLET(40 MG) BY MOUTH DAILY (Patient taking differently: Take 40 mg by mouth in the morning.), Disp: 90 tablet, Rfl:  3  Exam: Current vital signs: BP 138/66 (BP Location: Right Arm)   Pulse 70   Temp 98.3 F (36.8 C) (Oral)   Resp 20   SpO2 96%  Vital signs in last 24 hours: Temp:  [98.3 F (36.8 C)] 98.3 F (36.8 C) (07/20 1710) Pulse Rate:  [66-93] 70 (07/21 0600) Resp:  [16-24] 20 (07/21 0600) BP: (108-155)/(66-117) 138/66 (07/21 0600) SpO2:  [96 %-100 %] 96 % (07/21 0600)  GENERAL: Awake, alert, laying comfortably in ER stretcher, in no acute distress Psych: Affect appropriate for situation, calm and cooperative with examination Head: Normocephalic and atraumatic without obvious abnormality EENT: Arcus senilis present bilaterally, normal conjunctivae, dry mucous membranes, no OP obstruction LUNGS: Normal respiratory effort. Non-labored breathing on room air CV: Regular rate on telemetry, no pedal edema noted ABDOMEN - Soft, non-tender, non-distended Ext: warm, well perfused, without obvious deformity  NEURO:  Mental Status: Awake, alert, and oriented to person, place, time, and situation.  Speech evaluated with assistance from telephone interpreter without evidence of dysarthria or aphasia but interpreter comments that her speech is very slow.  Naming, repetition, fluency, and comprehension intact. No neglect is noted.  Cranial Nerves:  II: PERRL. Visual fields full.  III, IV, VI: EOMI without ptosis, nystagmus, or gaze preference.  V: Sensation is intact to light touch and symmetrical to face.  VII: Face is symmetric resting and smiling.  VIII: Hearing is intact to voice IX, X: Palate elevation is symmetric. Phonation normal.  XI: Normal sternocleidomastoid and trapezius muscle strength XII: Tongue protrudes midline without fasciculations.   Motor: 5/5 strength present in the right upper and lower extremities as well as the left lower extremity without vertical drift. Left upper extremity is 4/5 strength with vertical drift on assessment.  Tone is normal. Bulk is normal.  Sensation:  Intact to light touch bilaterally in lower extremities and right upper extremity. Left upper extremity is with decreased sensation to light touch compared to the RUE. Coordination: FTN intact bilaterally. HKS intact bilaterally.   DTRs: 2+ and symmetric biceps and brachioradialis.  Gait: Deferred  NIHSS: 1a Level of Conscious.: 0 1b LOC Questions: 0 1c LOC Commands: 0 2 Best Gaze: 0 3 Visual: 0 4 Facial Palsy: 0 5a Motor Arm - left: 1 5b Motor Arm - Right: 0 6a Motor Leg - Left: 0 6b Motor Leg - Right: 0 7 Limb Ataxia: 0 8 Sensory: 1 9 Best Language: 0 10 Dysarthria: 0 11 Extinct. and Inatten.: 0 TOTAL: 2  Labs I have reviewed labs in epic and the results pertinent to this consultation are: CBC    Component Value Date/Time   WBC 8.2 11/05/2020 1731   RBC 5.21 (H) 11/05/2020 1731   HGB 15.4 (H) 11/05/2020 1731   HGB 14.1 08/29/2012 0929   HCT 45.0 11/05/2020 1731   HCT 41.0 08/29/2012 0929   PLT 213 11/05/2020 1731   PLT 175 08/29/2012 0929   MCV 86.4 11/05/2020 1731   MCV 86.1 08/29/2012 0929   MCH 29.6 11/05/2020 1731  MCHC 34.2 11/05/2020 1731   RDW 12.1 11/05/2020 1731   RDW 13.3 08/29/2012 0929   LYMPHSABS 1.4 11/05/2020 1731   LYMPHSABS 1.0 08/29/2012 0929   MONOABS 0.6 11/05/2020 1731   MONOABS 0.4 08/29/2012 0929   EOSABS 0.1 11/05/2020 1731   EOSABS 0.1 08/29/2012 0929   BASOSABS 0.0 11/05/2020 1731   BASOSABS 0.0 08/29/2012 0929   CMP     Component Value Date/Time   NA 142 11/05/2020 1731   NA 142 08/29/2012 0929   K 3.4 (L) 11/05/2020 1731   K 4.0 08/29/2012 0929   CL 108 11/05/2020 1731   CL 107 08/29/2012 0929   CO2 22 11/05/2020 1731   CO2 22 08/29/2012 0929   GLUCOSE 125 (H) 11/05/2020 1731   GLUCOSE 118 (H) 08/29/2012 0929   BUN 15 11/05/2020 1731   BUN 17.7 08/29/2012 0929   CREATININE 0.67 11/05/2020 1731   CREATININE 0.9 08/29/2012 0929   CALCIUM 9.5 11/05/2020 1731   CALCIUM 8.8 08/29/2012 0929   PROT 7.9 11/05/2020 1731    PROT 7.0 08/29/2012 0929   ALBUMIN 4.6 11/05/2020 1731   ALBUMIN 3.6 08/29/2012 0929   AST 23 11/05/2020 1731   AST 18 08/29/2012 0929   ALT 20 11/05/2020 1731   ALT 17 08/29/2012 0929   ALKPHOS 73 11/05/2020 1731   ALKPHOS 117 08/29/2012 0929   BILITOT 0.9 11/05/2020 1731   BILITOT 0.67 08/29/2012 0929   GFRNONAA >60 11/05/2020 1731   GFRAA >90 01/28/2012 0345   Lipid Panel     Component Value Date/Time   CHOL 235 (H) 11/06/2020 0500   TRIG 186 (H) 11/06/2020 0500   HDL 59 11/06/2020 0500   CHOLHDL 4.0 11/06/2020 0500   VLDL 37 11/06/2020 0500   LDLCALC 139 (H) 11/06/2020 0500   Lab Results  Component Value Date   HGBA1C 6.0 (H) 11/06/2020   Imaging I have reviewed the images obtained:  MRI examination of the brain 11/05/2020: 1. Subacute right MCA infarcts. 2. Mild chronic small vessel ischemic disease.  CT Angio Head and Neck 11/05/2020: 1. No intracranial arterial occlusion or high-grade stenosis. 2. 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 Aortic Atherosclerosis (ICD10-I70.0).  Carotid Duplex US 11/06/2020 PRELIMINARY RESULTS: Right Carotid: Velocities in the right ICA are consistent with a 80-99% stenosis.  Left Carotid: Velocities in the left ICA are consistent with a 1-39% stenosis.  Vertebrals: Bilateral vertebral arteries demonstrate antegrade flow.  *See table(s) above for measurements and observations.     Assessment: 75 y.o. female who presented to [facility] following outpatient MRI brain imaging with evidence of multiple subacute R MCA infarcts. MRI brain imaging obtained due to patient with complaints of one month of bitemporal headaches and left upper extremity weakness and numbness. Pending transfer to [facility] for stroke team evaluation.  - Examination reveals patient with persistent left upper extremity weakness, "heaviness", and decreased sensation to light touch on the LUE. She also complains  of ongoing bitemporal headaches with right eye inferior visual field blurred vision with increasing headache intensity. Her NIHSS on neurology evaluation is a 2.  - MRI imaging reveals small R MCA infarcts. Vessel imaging obtained that shows 80% stenosis of the proximal right ICA 2/2 mixed density atherosclerosis. Stroke etiology consistent with right ICA findings of 80% stenosis due to atherosclerosis. Vascular team has been consulted by primary team for further recommendations for management of ICA stenosis. In addition, carotid duplex ultrasound imaging was complete  in the ED with preliminary evidence of velocities in the R ICA consistent with 80-99% stenosis.  - Stroke risk factors include patient's age, hyperlipidemia identified on lipid panel, and history of essential hypertension.   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 Note/plan to be edited by MD as needed.  Anibal Henderson, AGAC-NP [facility] Pager: [phone]  Neurology Attending Attestation   I examined the patient and discussed plan with Ms. Toberman NP. Above note has been edited by me to reflect my findings and recommendations. I was present throughout the stroke code and made all significant decisions and personally reviewed CNS imaging and also discussed CTA results with radiologist by phone.     Su Monks,  MD [facility] [phone]   If 7pm- 7am, please page neurology on call as listed in [place].

## 2020-11-06 NOTE — Progress Notes (Signed)
PROGRESS NOTE    [name]  JAS:[id number] DOB: [birth year] DOA: 11/05/2020 PCP: Lavone Orn, MD    Brief Narrative:  75 year old female with history of hypertension on amlodipine, GERD who has been suffering from dizziness and left arm weakness for more than a month, occasional headaches went to see her primary care physician who ordered an MRI and found to have right MCA territory stroke and was sent to the ER.  Patient herself was complaining of getting episodic dizziness and persistent weakness on the left side. In the emergency room hemodynamically stable.  Right MCA stroke as per MRI.  CTA consistent with right ICA stenosis.   Assessment & Plan:   Principal Problem:   CVA (cerebral vascular accident) ([place]) Active Problems:   GERD (gastroesophageal reflux disease)   Carotid stenosis, right   Hyperlipidemia  Acute right MCA territory stroke: Admit to monitored unit because of need for inpatient treatment and investigations. Neurochecks and vital signs as per stroke protocol.  Mostly subacute to stroke now. Patient was not a TPA and vascular intervention candidate because of chronic symptoms. Diet, tolerating regular diet Antiplatelets, none at home.  Started on aspirin and Plavix. Statin, LDL 139.  Not on treatment at home.  Will start on high intensity statin with Lipitor 80 mg daily. Hemoglobin A1c is 6.  No indication for treatment. Blood pressure goals, permissive hypertension given intracranial stenosis. Consultations, neurology, speech, PT OT MRI of the brain, with right MCA stroke. CTA of the head and neck consistent with 80% stenosis of right ICA.  May need surgical intervention.  Will refer to neurology. 2D echocardiogram, pending.  Hyperlipidemia: LDL 139.  Started on Lipitor 80 mg daily.  Essential hypertension: We will avoid hypotension.  Hold antihypertensives.  GERD: On PPI.  Continue.  DVT prophylaxis: enoxaparin (LOVENOX) injection 30 mg  Start: 11/05/20 2200 SCD's Start: 11/05/20 1955   Code Status: Full code Family Communication: None Disposition Plan: Status is: Inpatient  Remains inpatient appropriate because:Inpatient level of care appropriate due to severity of illness  Dispo: The patient is from: Home              Anticipated d/c is to: Home              Patient currently is not medically stable to d/c.   Difficult to place patient No         Consultants:  Neurology  Procedures:  None  Antimicrobials:  None   Subjective: Patient seen and examined in the emergency room.  She speaks limited [place] and can interpret most of the conversations.  Patient tells me that she has some dizziness and difficulty walking.  Denies any other symptoms.  Objective: Vitals:   11/06/20 0830 11/06/20 0845 11/06/20 0900 11/06/20 1216  BP: (!) 154/82  (!) 154/80 130/66  Pulse: 82 77 87 82  Resp:   18 16  Temp:      TempSrc:      SpO2: 98% 98% 97% 97%   No intake or output data in the 24 hours ending 11/06/20 1446 There were no vitals filed for this visit.  Examination:  General exam: Appears calm and comfortable  Respiratory system: Clear to auscultation. Respiratory effort normal. Cardiovascular system: S1 & S2 heard, RRR. No JVD, murmurs, rubs, gallops or clicks. No pedal edema. Gastrointestinal system: Abdomen is nondistended, soft and nontender. No organomegaly or masses felt. Normal bowel sounds heard. Central nervous system: Alert and oriented.  Left upper extremity motor power  4/5. No cranial nerve deficits.    Data Reviewed: I have personally reviewed following labs and imaging studies  CBC: Recent Labs  Lab 11/05/20 1731  WBC 8.2  NEUTROABS 6.1  HGB 15.4*  HCT 45.0  MCV 86.4  PLT 824   Basic Metabolic Panel: Recent Labs  Lab 11/05/20 1731  NA 142  K 3.4*  CL 108  CO2 22  GLUCOSE 125*  BUN 15  CREATININE 0.67  CALCIUM 9.5   GFR: CrCl cannot be calculated (Unknown ideal  weight.). Liver Function Tests: Recent Labs  Lab 11/05/20 1731  AST 23  ALT 20  ALKPHOS 73  BILITOT 0.9  PROT 7.9  ALBUMIN 4.6   No results for input(s): LIPASE, AMYLASE in the last 168 hours. No results for input(s): AMMONIA in the last 168 hours. Coagulation Profile: Recent Labs  Lab 11/05/20 1731  INR 1.0   Cardiac Enzymes: No results for input(s): CKTOTAL, CKMB, CKMBINDEX, TROPONINI in the last 168 hours. BNP (last 3 results) No results for input(s): PROBNP in the last 8760 hours. HbA1C: Recent Labs    11/06/20 0500  HGBA1C 6.0*   CBG: No results for input(s): GLUCAP in the last 168 hours. Lipid Profile: Recent Labs    11/06/20 0500  CHOL 235*  HDL 59  LDLCALC 139*  TRIG 186*  CHOLHDL 4.0   Thyroid Function Tests: No results for input(s): TSH, T4TOTAL, FREET4, T3FREE, THYROIDAB in the last 72 hours. Anemia Panel: No results for input(s): VITAMINB12, FOLATE, FERRITIN, TIBC, IRON, RETICCTPCT in the last 72 hours. Sepsis Labs: No results for input(s): PROCALCITON, LATICACIDVEN in the last 168 hours.  Recent Results (from the past 240 hour(s))  Resp Panel by RT-PCR (Flu A&B, Covid) Nasopharyngeal Swab     Status: None   Collection Time: 11/05/20  5:48 PM   Specimen: Nasopharyngeal Swab; Nasopharyngeal(NP) swabs in vial transport medium  Result Value Ref Range Status   SARS Coronavirus 2 by RT PCR NEGATIVE NEGATIVE Final    Comment: (NOTE) SARS-CoV-2 target nucleic acids are NOT DETECTED.  The SARS-CoV-2 RNA is generally detectable in upper respiratory specimens during the acute phase of infection. The lowest concentration of SARS-CoV-2 viral copies this assay can detect is 138 copies/mL. A negative result does not preclude SARS-Cov-2 infection and should not be used as the sole basis for treatment or other patient management decisions. A negative result may occur with  improper specimen collection/handling, submission of specimen other than  nasopharyngeal swab, presence of viral mutation(s) within the areas targeted by this assay, and inadequate number of viral copies(<138 copies/mL). A negative result must be combined with clinical observations, patient history, and epidemiological information. The expected result is Negative.  Fact Sheet for Patients:  EntrepreneurPulse.com.au  Fact Sheet for Healthcare Providers:  IncredibleEmployment.be  This test is no t yet approved or cleared by the [place] FDA and  has been authorized for detection and/or diagnosis of SARS-CoV-2 by FDA under an Emergency Use Authorization (EUA). This EUA will remain  in effect (meaning this test can be used) for the duration of the COVID-19 declaration under Section 564(b)(1) of the Act, 21 U.S.C.section 360bbb-3(b)(1), unless the authorization is terminated  or revoked sooner.       Influenza A by PCR NEGATIVE NEGATIVE Final   Influenza B by PCR NEGATIVE NEGATIVE Final    Comment: (NOTE) The Xpert Xpress SARS-CoV-2/FLU/RSV plus assay is intended as an aid in the diagnosis of influenza from Nasopharyngeal swab specimens and should not be  used as a sole basis for treatment. Nasal washings and aspirates are unacceptable for Xpert Xpress SARS-CoV-2/FLU/RSV testing.  Fact Sheet for Patients: EntrepreneurPulse.com.au  Fact Sheet for Healthcare Providers: IncredibleEmployment.be  This test is not yet approved or cleared by the [place] FDA and has been authorized for detection and/or diagnosis of SARS-CoV-2 by FDA under an Emergency Use Authorization (EUA). This EUA will remain in effect (meaning this test can be used) for the duration of the COVID-19 declaration under Section 564(b)(1) of the Act, 21 U.S.C. section 360bbb-3(b)(1), unless the authorization is terminated or revoked.  Performed at [facility], [address], [place], [place] [address]          Radiology Studies: CT ANGIO HEAD NECK W WO CM  Result Date: 11/05/2020 CLINICAL DATA:  Ataxia, stroke suspected EXAM: CT ANGIOGRAPHY HEAD AND NECK TECHNIQUE: Multidetector CT imaging of the head and neck was performed using the standard protocol during bolus administration of intravenous contrast. Multiplanar CT image reconstructions and MIPs were obtained to evaluate the vascular anatomy. Carotid stenosis measurements (when applicable) are obtained utilizing NASCET criteria, using the distal internal carotid diameter as the denominator. CONTRAST:  25mL OMNIPAQUE IOHEXOL 350 MG/ML SOLN COMPARISON:  None. FINDINGS: CT HEAD FINDINGS Brain: There is no mass, hemorrhage or extra-axial collection. The size and configuration of the ventricles and extra-axial CSF spaces are normal. There is no acute or chronic infarction. The brain parenchyma is normal. Skull: The visualized skull base, calvarium and extracranial soft tissues are normal. Sinuses/Orbits: No fluid levels or advanced mucosal thickening of the visualized paranasal sinuses. No mastoid or middle ear effusion. The orbits are normal. CTA NECK FINDINGS SKELETON: There is no bony spinal canal stenosis. No lytic or blastic lesion. OTHER NECK: Normal pharynx, larynx and major salivary glands. No cervical lymphadenopathy. Unremarkable thyroid gland. UPPER CHEST: No pneumothorax or pleural effusion. No nodules or masses. AORTIC ARCH: There is calcific atherosclerosis of the aortic arch. There is no aneurysm, dissection or hemodynamically significant stenosis of the visualized portion of the aorta. Conventional 3 vessel aortic branching pattern. The visualized proximal subclavian arteries are widely patent. RIGHT CAROTID SYSTEM: No dissection, occlusion or aneurysm. There is mixed density atherosclerosis extending into the proximal ICA, resulting in 80% stenosis. LEFT CAROTID SYSTEM: Normal without aneurysm, dissection or  stenosis. VERTEBRAL ARTERIES: Left dominant configuration. Both origins are clearly patent. There is no dissection, occlusion or flow-limiting stenosis to the skull base (V1-V3 segments). CTA HEAD FINDINGS POSTERIOR CIRCULATION: --Vertebral arteries: Normal V4 segments. --Inferior cerebellar arteries: Normal. --Basilar artery: Normal. --Superior cerebellar arteries: Normal. --Posterior cerebral arteries (PCA): Normal. ANTERIOR CIRCULATION: --Intracranial internal carotid arteries: Normal. --Anterior cerebral arteries (ACA): Normal. Both A1 segments are present. Patent anterior communicating artery (a-comm). --Middle cerebral arteries (MCA): Normal. VENOUS SINUSES: As permitted by contrast timing, patent. ANATOMIC VARIANTS: None Review of the MIP images confirms the above findings. IMPRESSION: 1. No intracranial arterial occlusion or high-grade stenosis. 2. 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 Aortic Atherosclerosis (ICD10-I70.0). Electronically Signed   By: Ulyses Jarred M.D.   On: 11/05/2020 19:58   DG Chest 2 View  Result Date: 11/05/2020 CLINICAL DATA:  Stroke, left arm weakness EXAM: CHEST - 2 VIEW COMPARISON:  03/24/2015 FINDINGS: The heart size and mediastinal contours are within normal limits. Both lungs are clear. The visualized skeletal structures are unremarkable. IMPRESSION: No active cardiopulmonary disease. Electronically Signed   By: Fidela Salisbury MD   On:  11/05/2020 20:32   MR BRAIN W WO CONTRAST  Result Date: 11/05/2020 CLINICAL DATA:  Essential hypertension.  Headache. EXAM: MRI HEAD WITHOUT AND WITH CONTRAST TECHNIQUE: Multiplanar, multiecho pulse sequences of the brain and surrounding structures were obtained without and with intravenous contrast. CONTRAST:  63mL MULTIHANCE GADOBENATE DIMEGLUMINE 529 MG/ML IV SOLN COMPARISON:  06/09/2008 FINDINGS: Brain: There are multiple small regions of diffusion weighted signal  abnormality in the right frontal and parietal lobes involving white matter of the centrum semiovale as well as cortex and subcortical white matter towards the vertex compatible with subacute infarcts (MCA and deep watershed territory). There is associated gyriform enhancement most notable in the parietal lobe where there is also a small focus of petechial hemorrhage. Small T2 hyperintensities elsewhere in the cerebral white matter bilaterally are nonspecific but compatible with mild chronic small vessel ischemic disease. There is mild cerebral atrophy. No mass, midline shift, or extra-axial fluid collection is identified. Vascular: Major intracranial vascular flow voids are preserved. Skull and upper cervical spine: Unremarkable bone marrow signal. Sinuses/Orbits: Bilateral cataract extraction. Paranasal sinuses and mastoid air cells are clear. Other: None. IMPRESSION: 1. Subacute right MCA infarcts. 2. Mild chronic small vessel ischemic disease. Electronically Signed   By: Logan Bores M.D.   On: 11/05/2020 13:36   VAS US CAROTID (at [facility] and [facility] only)  Result Date: 11/06/2020 Carotid Arterial Duplex Study Patient Name:  HAYVEN CROY [place]  Date of Exam:   11/06/2020 Medical Rec #: [id number]             Accession #:    [id number] Date of Birth: 03-30-1946            Patient Gender: F Patient Age:   74Y Exam Location:  [facility] Procedure:      VAS US CAROTID Referring Phys: [id number] [place] --------------------------------------------------------------------------------  Indications:       CVA. Risk Factors:      None. Comparison Study:  11/05/2020 - CT ANGIO HEAD NECK W WO CM                    IMPRESSION:                    1. No intracranial arterial occlusion or high-grade stenosis.                    2. 80% stenosis of the proximal right internal carotid artery                    secondary to mixed density atherosclerosis. Performing Technologist: Oliver Hum RVT  Examination  Guidelines: A complete evaluation includes B-mode imaging, spectral Doppler, color Doppler, and power Doppler as needed of all accessible portions of each vessel. Bilateral testing is considered an integral part of a complete examination. Limited examinations for reoccurring indications may be performed as noted.  Right Carotid Findings: +----------+--------+--------+--------+--------------------------+--------+           PSV cm/sEDV cm/sStenosisPlaque Description        Comments +----------+--------+--------+--------+--------------------------+--------+ CCA Prox  98      16              smooth and heterogenous            +----------+--------+--------+--------+--------------------------+--------+ CCA Distal39      10              smooth and heterogenous            +----------+--------+--------+--------+--------------------------+--------+  ICA Prox  374     162     80-99%  irregular and heterogenous         +----------+--------+--------+--------+--------------------------+--------+ ICA Mid   110     28              smooth and heterogenous            +----------+--------+--------+--------+--------------------------+--------+ ICA Distal62      22                                        tortuous +----------+--------+--------+--------+--------------------------+--------+ ECA       116     9                                                  +----------+--------+--------+--------+--------------------------+--------+ +----------+--------+-------+--------+-------------------+           PSV cm/sEDV cmsDescribeArm Pressure (mmHG) +----------+--------+-------+--------+-------------------+ [id number]                                         +----------+--------+-------+--------+-------------------+ +---------+--------+--+--------+--+---------+ VertebralPSV cm/s40EDV cm/s12Antegrade +---------+--------+--+--------+--+---------+  Left Carotid Findings:  +----------+--------+--------+--------+-----------------------+--------+           PSV cm/sEDV cm/sStenosisPlaque Description     Comments +----------+--------+--------+--------+-----------------------+--------+ CCA Prox  70      14              smooth and heterogenous         +----------+--------+--------+--------+-----------------------+--------+ CCA Distal47      14              smooth and heterogenous         +----------+--------+--------+--------+-----------------------+--------+ ICA Prox  49      17              smooth and heterogenous         +----------+--------+--------+--------+-----------------------+--------+ ICA Distal68      24                                     tortuous +----------+--------+--------+--------+-----------------------+--------+ ECA       60      6                                               +----------+--------+--------+--------+-----------------------+--------+ +----------+--------+--------+--------+-------------------+           PSV cm/sEDV cm/sDescribeArm Pressure (mmHG) +----------+--------+--------+--------+-------------------+ [id number]                                         +----------+--------+--------+--------+-------------------+ +---------+--------+--+--------+--+---------+ VertebralPSV cm/s40EDV cm/s13Antegrade +---------+--------+--+--------+--+---------+   Summary: Right Carotid: Velocities in the right ICA are consistent with a 80-99%                stenosis. Left Carotid: Velocities in the left ICA are consistent with a 1-39% stenosis. Vertebrals: Bilateral vertebral arteries demonstrate antegrade flow. *See table(s) above for measurements and observations.  Electronically signed by Antony Contras MD on 11/06/2020 at 1:00:26 PM.    Final         Scheduled Meds:   stroke: mapping our early stages of recovery book   Does not apply Once   aspirin EC  81 mg Oral Daily   atorvastatin  80 mg Oral Daily    enoxaparin (LOVENOX) injection  30 mg Subcutaneous Q24H   Continuous Infusions:   LOS: 1 day    Time spent: 30 minutes    Barb Merino, MD [facility] Pager [phone]

## 2020-11-06 NOTE — ED Notes (Signed)
Breakfast tray given. °

## 2020-11-06 NOTE — Evaluation (Signed)
Occupational Therapy Evaluation Patient Details Name: [name] MRN: [id number] DOB: Mar 12, 1946 Today's Date: 11/06/2020    History of Present Illness [name] is a 75 y.o. female with a known history of hypertension, GERD, vertigo was in a usual state of health until 1 month ago when she developed sudden onset of headache associated with left arm weakness and bilateral lower extremity weakness.  She was seen by her primary care doctor who ordered an MRI as an outpatient which  showed multiple subacute strokes.   Clinical Impression   [name] is a 75 year old woman who presents with left sided hemiparesis with more deficits in upper extremity than lower extremity. She also reports dizziness with movement, exhibits generalized weakness, decreased activity tolerance and impaired balance. Patient needing min guard for standing and ambulation, predominantly min assist for ADLs due to impaired coordination and sensation in left hand and mod assist for LB dressing. Patient reports symptoms for a month. Therapist had patient call daughter. Patient's daughter reports patient will have assistance at home. Recommend HH OT at discharge.    Follow Up Recommendations  Home health OT    Equipment Recommendations  None recommended by OT    Recommendations for Other Services       Precautions / Restrictions Precautions Precautions: Fall Restrictions Weight Bearing Restrictions: No      Mobility Bed Mobility Overal bed mobility: Needs Assistance Bed Mobility: Supine to Sit;Sit to Supine     Supine to sit: Min guard Sit to supine: Min guard   General bed mobility comments: increased time to perform    Transfers Overall transfer level: Needs assistance   Transfers: Sit to/from Stand;Stand Pivot Transfers Sit to Stand: Min guard Stand pivot transfers: Min guard            Balance Overall balance assessment: Needs assistance Sitting-balance support: No upper  extremity supported Sitting balance-Leahy Scale: Good     Standing balance support: No upper extremity supported Standing balance-Leahy Scale: Fair                             ADL either performed or assessed with clinical judgement   ADL Overall ADL's : Needs assistance/impaired Eating/Feeding: Set up   Grooming: Min guard;Standing;Wash/dry hands   Upper Body Bathing: Set up;Sitting   Lower Body Bathing: Sit to/from stand;Min guard   Upper Body Dressing : Minimal assistance Upper Body Dressing Details (indicate cue type and reason): for buttons/fasteners Lower Body Dressing: Sit to/from stand;Moderate assistance Lower Body Dressing Details (indicate cue type and reason): need assistance for fasteners and donning socks. Dropping sock with left hand, difficulty using left hand with dressing Toilet Transfer: Min guard;Regular Toilet;Grab bars   Toileting- Clothing Manipulation and Hygiene: Supervision/safety;Sit to/from stand       Functional mobility during ADLs: Min guard       Vision   Vision Assessment?: No apparent visual deficits     Perception     Praxis      Pertinent Vitals/Pain Pain Assessment: No/denies pain     Hand Dominance Right   Extremity/Trunk Assessment Upper Extremity Assessment Upper Extremity Assessment: RUE deficits/detail;LUE deficits/detail RUE Deficits / Details: WFL ROM, grossly 3+/5 strength, strength testing effected by language barrier. Strength functional with movement and ADLs. RUE Sensation: WNL RUE Coordination: WNL LUE Sensation: decreased light touch (numbness/tingling) LUE Coordination: decreased fine motor;decreased gross motor   Lower Extremity Assessment Lower Extremity Assessment: Defer  to PT evaluation   Cervical / Trunk Assessment Cervical / Trunk Assessment: Normal   Communication Communication Communication: Prefers language other than English   Cognition Arousal/Alertness: Awake/alert Behavior  During Therapy: WFL for tasks assessed/performed Overall Cognitive Status: Difficult to assess                                     General Comments       Exercises     Shoulder Instructions      Home Living Family/patient expects to be discharged to:: Private residence Living Arrangements: Children Available Help at Discharge: Family Type of Home: House Home Access: Stairs to enter     Home Layout: One level               [place]: None          Prior Functioning/Environment Level of Independence: Needs assistance  Gait / Transfers Assistance Needed: Reports she hasn't been walking by herself. ADL's / Homemaking Assistance Needed: Min assist for ADLs.            OT Problem List: Decreased strength;Decreased range of motion;Decreased activity tolerance;Impaired balance (sitting and/or standing);Decreased coordination;Impaired sensation;Impaired UE functional use      OT Treatment/Interventions: Self-care/ADL training;Therapeutic exercise;Neuromuscular education;DME and/or AE instruction;Therapeutic activities;Balance training;Patient/family education    OT Goals(Current goals can be found in the care plan section) Acute Rehab OT Goals OT Goal Formulation: Patient unable to participate in goal setting Time For Goal Achievement: 11/20/20 Potential to Achieve Goals: Good  OT Frequency: Min 2X/week   Barriers to D/C:            Co-evaluation              AM-PAC OT "6 Clicks" Daily Activity     Outcome Measure Help from another person eating meals?: A Little Help from another person taking care of personal grooming?: A Little Help from another person toileting, which includes using toliet, bedpan, or urinal?: A Little Help from another person bathing (including washing, rinsing, drying)?: A Little Help from another person to put on and taking off regular upper body clothing?: A Little Help from another person to put on and taking  off regular lower body clothing?: A Lot 6 Click Score: 17   End of Session Equipment Utilized During Treatment: Gait belt Nurse Communication:  (okay to see per RN)  Activity Tolerance: Patient tolerated treatment well Patient left: in bed;with call bell/phone within reach  OT Visit Diagnosis: Hemiplegia and hemiparesis Hemiplegia - Right/Left: Left Hemiplegia - dominant/non-dominant: Non-Dominant Hemiplegia - caused by: Cerebral infarction                Time: 8115-7262 OT Time Calculation (min): 32 min Charges:  OT General Charges $OT Visit: 1 Visit OT Evaluation $OT Eval Moderate Complexity: 1 Mod  Colbe Viviano, OTR/L [place]  Office [phone] Pager: [place] 11/06/2020, 12:35 PM

## 2020-11-06 NOTE — Evaluation (Signed)
Physical Therapy Evaluation Patient Details Name: [name] MRN: [id number] DOB: 20-Nov-1945 Today's Date: 11/06/2020   History of Present Illness  [name] is a 75 y.o. female with a known history of hypertension, GERD, vertigo was in a usual state of health until 1 month ago when she developed sudden onset of headache associated with left arm weakness and bilateral lower extremity weakness.  She was seen by her primary care doctor who ordered an MRI as an outpatient which  showed multiple subacute strokes.  Clinical Impression  The patient  received resting in bed. Interpreter ipad not available.  Patient understanding some English but noted difficulty  at times to follow. Patient stated several times that she cannot walk by herself, yet states she is alone  while family works . Daughter reports that family is available to assist 24/7 via phone communication.  Patient  with mild decreased Left leg weakness, ambulated 36'  with HHA and 75' with RW. Noted up in BR  ambulated to sink , turned around, no balance loss. Patient indicates feeling dizzy when  sitting and ambulating. BP was stable with no drop./ Pt admitted with above diagnosis.  Pt currently with functional limitations due to the deficits listed below (see PT Problem List). Pt will benefit from skilled PT to increase their independence and safety with mobility to allow discharge to the venue listed below.      Follow Up Recommendations Home health PT;Supervision/Assistance - 24 hour    Equipment Recommendations  Rolling walker with 5" wheels    Recommendations for Other Services       Precautions / Restrictions Precautions Precautions: Fall Restrictions Weight Bearing Restrictions: No      Mobility  Bed Mobility Overal bed mobility: Needs Assistance Bed Mobility: Supine to Sit;Sit to Supine     Supine to sit: Min guard Sit to supine: Min guard   General bed mobility comments: increased time to perform     Transfers Overall transfer level: Needs assistance   Transfers: Sit to/from Stand;Stand Pivot Transfers Sit to Stand: Min guard Stand pivot transfers: Min guard          Ambulation/Gait Ambulation/Gait assistance: Min assist Gait Distance (Feet): 90 Feet Assistive device: 1 person hand held assist Gait Pattern/deviations: Step-through pattern     General Gait Details: then 50' with RW.  patient stated, "I cannot walk by myself"  Stairs            Wheelchair Mobility    Modified Rankin (Stroke Patients Only)       Balance Overall balance assessment: Needs assistance Sitting-balance support: No upper extremity supported Sitting balance-Leahy Scale: Good     Standing balance support: No upper extremity supported Standing balance-Leahy Scale: Fair Standing balance comment: in BR stood and flushed toilet, walked to sink and washed hands, no balance loss.                             Pertinent Vitals/Pain Pain Assessment: No/denies pain    Home Living Family/patient expects to be discharged to:: Private residence Living Arrangements: Children Available Help at Discharge: Family Type of Home: House Home Access: Stairs to enter     Home Layout: One level Home Equipment: None      Prior Function Level of Independence: Needs assistance   Gait / Transfers Assistance Needed: Reports she hasn't been walking by herself., states that she has had falls.  ADL's / [employer]  Needed: Min assist for ADLs.        Hand Dominance   Dominant Hand: Right    Extremity/Trunk Assessment   Upper Extremity Assessment Upper Extremity Assessment: Defer to OT evaluation RUE Deficits / Details: WFL ROM, grossly 3+/5 strength, strength testing effected by language barrier. Strength functional with movement and ADLs. RUE Sensation: WNL RUE Coordination: WNL LUE Sensation: decreased light touch LUE Coordination: decreased fine motor;decreased  gross motor    Lower Extremity Assessment Lower Extremity Assessment: RLE deficits/detail;LLE deficits/detail RLE Sensation: WNL RLE Coordination: WNL LLE Deficits / Details: does not indicate any sensory differences, mild decreased strength vs not following instructions LLE Sensation: WNL LLE Coordination: WNL    Cervical / Trunk Assessment Cervical / Trunk Assessment: Normal  Communication   Communication: Prefers language other than English  Cognition Arousal/Alertness: Awake/alert Behavior During Therapy: WFL for tasks assessed/performed Overall Cognitive Status: Difficult to assess                                 General Comments: does  understand some ENglish but difficult to follow  instruction for strength test.      General Comments      Exercises     Assessment/Plan    PT Assessment Patient needs continued PT services  PT Problem List Decreased strength;Decreased mobility;Decreased safety awareness;Decreased activity tolerance;Decreased balance       PT Treatment Interventions DME instruction;Therapeutic activities;Gait training;Therapeutic exercise;Patient/family education;Balance training;Functional mobility training    PT Goals (Current goals can be found in the Care Plan section)  Acute Rehab PT Goals Patient Stated Goal: go home PT Goal Formulation: With patient/family Time For Goal Achievement: 11/20/20 Potential to Achieve Goals: Good    Frequency Min 4X/week   Barriers to discharge Decreased caregiver support      Co-evaluation PT/OT/SLP Co-Evaluation/Treatment: Yes Reason for Co-Treatment: For patient/therapist safety PT goals addressed during session: Mobility/safety with mobility OT goals addressed during session: ADL's and self-care       AM-PAC PT "6 Clicks" Mobility  Outcome Measure Help needed turning from your back to your side while in a flat bed without using bedrails?: A Little Help needed moving from lying on  your back to sitting on the side of a flat bed without using bedrails?: A Little Help needed moving to and from a bed to a chair (including a wheelchair)?: A Little Help needed standing up from a chair using your arms (e.g., wheelchair or bedside chair)?: A Little Help needed to walk in hospital room?: A Little Help needed climbing 3-5 steps with a railing? : A Lot 6 Click Score: 17    End of Session Equipment Utilized During Treatment: Gait belt Activity Tolerance: Patient tolerated treatment well Patient left: in bed;with call bell/phone within reach Nurse Communication: Mobility status PT Visit Diagnosis: Unsteadiness on feet (R26.81);Muscle weakness (generalized) (M62.81)    Time: 8546-2703 PT Time Calculation (min) (ACUTE ONLY): 32 min   Charges:   PT Evaluation $PT Eval Low Complexity: [place] [facility] Acute Rehabilitation Services Pager [phone] Office [phone]   Claretha Cooper 11/06/2020, 1:42 PM

## 2020-11-06 NOTE — ED Notes (Signed)
Assisted pt to restroom, pt has steady gait w assistance

## 2020-11-06 NOTE — Progress Notes (Signed)
Carotid artery duplex has been completed. Preliminary results can be found in CV Proc through chart review.  Results were given to Dr. Sloan Leiter.  11/06/20 8:46 AM Carlos Levering RVT

## 2020-11-07 ENCOUNTER — Other Ambulatory Visit (HOSPITAL_COMMUNITY): Payer: Medicare Other

## 2020-11-07 DIAGNOSIS — Z8673 Personal history of transient ischemic attack (TIA), and cerebral infarction without residual deficits: Secondary | ICD-10-CM

## 2020-11-07 DIAGNOSIS — I63511 Cerebral infarction due to unspecified occlusion or stenosis of right middle cerebral artery: Secondary | ICD-10-CM | POA: Diagnosis not present

## 2020-11-07 DIAGNOSIS — I10 Essential (primary) hypertension: Secondary | ICD-10-CM

## 2020-11-07 DIAGNOSIS — Z87891 Personal history of nicotine dependence: Secondary | ICD-10-CM

## 2020-11-07 DIAGNOSIS — I2510 Atherosclerotic heart disease of native coronary artery without angina pectoris: Secondary | ICD-10-CM

## 2020-11-07 DIAGNOSIS — I639 Cerebral infarction, unspecified: Secondary | ICD-10-CM | POA: Diagnosis not present

## 2020-11-07 DIAGNOSIS — I6521 Occlusion and stenosis of right carotid artery: Secondary | ICD-10-CM

## 2020-11-07 LAB — BASIC METABOLIC PANEL
Anion gap: 9 (ref 5–15)
BUN: 25 mg/dL — ABNORMAL HIGH (ref 8–23)
CO2: 24 mmol/L (ref 22–32)
Calcium: 9.5 mg/dL (ref 8.9–10.3)
Chloride: 107 mmol/L (ref 98–111)
Creatinine, Ser: 0.83 mg/dL (ref 0.44–1.00)
GFR, Estimated: >60 mL/min (ref >60)
Glucose, Bld: 113 mg/dL — ABNORMAL HIGH (ref 70–99)
Potassium: 4.4 mmol/L (ref 3.5–5.1)
Sodium: 140 mmol/L (ref 135–145)

## 2020-11-07 LAB — TYPE AND SCREEN
ABO/RH(D): B POS
Antibody Screen: NEGATIVE

## 2020-11-07 LAB — PROTIME-INR
INR: 1 (ref 0.8–1.2)
Prothrombin Time: 13.1 seconds (ref 11.4–15.2)

## 2020-11-07 LAB — MAGNESIUM: Magnesium: 1.8 mg/dL (ref 1.7–2.4)

## 2020-11-07 MED ORDER — ADULT MULTIVITAMIN W/MINERALS CH
1.0000 | ORAL_TABLET | Freq: Every day | ORAL | Status: DC
  Administered 2020-11-07 – 2020-11-13 (×6): 1 via ORAL
  Filled 2020-11-07 (×6): qty 1

## 2020-11-07 MED ORDER — ENSURE ENLIVE PO LIQD
237.0000 mL | Freq: Two times a day (BID) | ORAL | Status: DC
  Administered 2020-11-07 – 2020-11-13 (×10): 237 mL via ORAL

## 2020-11-07 MED ORDER — CLOPIDOGREL BISULFATE 75 MG PO TABS
75.0000 mg | ORAL_TABLET | Freq: Every day | ORAL | Status: DC
  Administered 2020-11-07 – 2020-11-13 (×7): 75 mg via ORAL
  Filled 2020-11-07 (×7): qty 1

## 2020-11-07 MED ORDER — ACETAMINOPHEN 325 MG PO TABS
650.0000 mg | ORAL_TABLET | Freq: Four times a day (QID) | ORAL | Status: DC | PRN
  Administered 2020-11-07 – 2020-11-11 (×7): 650 mg via ORAL
  Filled 2020-11-07 (×7): qty 2

## 2020-11-07 NOTE — Progress Notes (Signed)
Physical Therapy Treatment Patient Details Name: [name] MRN: [id number] DOB: 11-09-1945 Today's Date: 11/07/2020    History of Present Illness The pt is a 75 yo female presenting 7/20 after outpatient MRI revealed multiple subacute strokes. The pt reports she has been experiencing dizziness and weakness in LUE and bilateral LE for ~1 month prior to [place]. Work up revealed R MCA CVA, right 56 - 99 % carotid artery stenosis with planned revascularization next week. PMH includes: vertigo, HTN, and GERD.    PT Comments    The pt was seen for continued progression of OOB mobility, gait, stability, and LE strengthening. The pt was eager to mobilize, but limited to ~75 ft at this time due to c/o LE weakness. The pt had no instances of buckling, but did report numbness and weakness in RLE that seemed to be increasingly bothersome with continued mobility. Was able to complete seated exercises with return to room, and demos good force production against gravity and mod resistance at this time in BLE (LLE weaker than RLE to MMT, pt with more c/o weakness in RLE than LLE). Will continue to benefit from skilled PT to progress functional strength and stability, will be safe to return home with family assist.    Follow Up Recommendations  Home health PT;Supervision/Assistance - 24 hour     Equipment Recommendations  Rolling walker with 5" wheels    Recommendations for Other Services       Precautions / Restrictions Precautions Precautions: Fall Restrictions Weight Bearing Restrictions: No    Mobility  Bed Mobility Overal bed mobility: Needs Assistance Bed Mobility: Supine to Sit;Sit to Supine     Supine to sit: Supervision Sit to supine: Supervision   General bed mobility comments: increased time, able to complete without assist form flat bed    Transfers Overall transfer level: Needs assistance Equipment used: Rolling walker (2 wheeled) Transfers: Sit to/from  Stand Sit to Stand: Min guard         General transfer comment: minG for safety, no physical assist needed  Ambulation/Gait Ambulation/Gait assistance: Min assist Gait Distance (Feet): 75 Feet Assistive device: Rolling walker (2 wheeled);1 person hand held assist Gait Pattern/deviations: Step-through pattern;Decreased stride length;Shuffle;Narrow base of support Gait velocity: decreased   General Gait Details: The pt was able to complete ~45 ft with RW for pt comfort, then reporting increased weakness in RLE needing to return to room. able to complete with single UE suported through [facility]. very slow, small steps with short strides. pt needing max cues for positioning in RW and posture      Modified Rankin (Stroke Patients Only) Modified Rankin (Stroke Patients Only) Pre-Morbid Rankin Score: Moderate disability Modified Rankin: Moderately severe disability     Balance Overall balance assessment: Needs assistance Sitting-balance support: No upper extremity supported Sitting balance-Leahy Scale: Good     Standing balance support: Single extremity supported Standing balance-Leahy Scale: Fair Standing balance comment: static stand without assist, minA or single UE support for dynamic                            Cognition Arousal/Alertness: Awake/alert Behavior During Therapy: WFL for tasks assessed/performed Overall Cognitive Status: Difficult to assess                                 General Comments: pt following simple commands in english, attempted to use  interpreter, but due to technical failure, unable to use for full session. The pt followed commands briskly, able to detail mobility at home. no family present to confirm      Exercises General Exercises - Lower Extremity Long Arc Quad: AROM;Both;15 reps;Seated Heel Slides: AROM;Both;10 reps;Seated Hip Flexion/Marching: AROM;Both;10 reps;Seated    General Comments General comments (skin  integrity, edema, etc.): VSS, attempted to use interpreter, but technology failure after ~10 min      Pertinent Vitals/Pain Pain Assessment: No/denies pain Pain Score: 0-No pain Faces Pain Scale: No hurt Pain Intervention(s): Monitored during session    PT Goals (current goals can now be found in the care plan section) Acute Rehab PT Goals Patient Stated Goal: go home PT Goal Formulation: With patient/family Time For Goal Achievement: 11/20/20 Potential to Achieve Goals: Good Progress towards PT goals: Progressing toward goals    Frequency    Min 4X/week      PT Plan Current plan remains appropriate       AM-PAC PT "6 Clicks" Mobility   Outcome Measure  Help needed turning from your back to your side while in a flat bed without using bedrails?: A Little Help needed moving from lying on your back to sitting on the side of a flat bed without using bedrails?: A Little Help needed moving to and from a bed to a chair (including a wheelchair)?: A Little Help needed standing up from a chair using your arms (e.g., wheelchair or bedside chair)?: A Little Help needed to walk in hospital room?: A Little Help needed climbing 3-5 steps with a railing? : A Lot 6 Click Score: 17    End of Session Equipment Utilized During Treatment: Gait belt Activity Tolerance: Patient tolerated treatment well Patient left: in bed;with call bell/phone within reach Nurse Communication: Mobility status PT Visit Diagnosis: Unsteadiness on feet (R26.81);Muscle weakness (generalized) (M62.81)     Time: [id number] PT Time Calculation (min) (ACUTE ONLY): 17 min  Charges:  $Gait Training: 8-22 mins                     Inocencio Homes, PT, DPT   Acute Rehabilitation Department Pager #: [phone]   Otho Bellows 11/07/2020, 4:44 PM

## 2020-11-07 NOTE — Progress Notes (Signed)
PROGRESS NOTE                                                                                                                                                                                                             Patient Demographics:    [name], is a 75 y.o. female, DOB - 1945/11/21, [id number]  Outpatient Primary MD for the patient is Lavone Orn, MD    LOS - 2  Admit date - 11/05/2020    Chief Complaint  Patient presents with   Abnormal Lab       Brief Narrative (HPI from H&P) - [name] is a 75 y.o. female with a known history of hypertension, GERD, vertigo was in a usual state of health until 1 month ago when she developed sudden onset of headache associated with left arm weakness and bilateral lower extremity weakness, work-up showed right MCA CVA.   Subjective:    [name] today has, No headache, No chest pain, No abdominal pain - No Nausea, No new weakness tingling or numbness, no shortness of breath mild left arm weakness, left leg weakness improved   Assessment  & Plan :     Left-sided weakness arm more than leg due to acute Right MCA stroke.  With evidence of right ICA significant stenosis greater than 80%, stroke team following she is on dual antiplatelet therapy and high intensity statin will stroke work-up underway, LDL was above goal, A1c was stable, echo pending, DW VVS and  CVA team Dr Leonie Man, likely  R.ICA surgery this admission.  on tele, obtain PT, OT, speech input, A1c and lipid panel, check MRI MRA brain, echogram, carotid duplex, neurology following   Lab Results  Component Value Date   HGBA1C 6.0 (H) 11/06/2020    Lab Results  Component Value Date   CHOL 235 (H) 11/06/2020   HDL 59 11/06/2020   LDLCALC 139 (H) 11/06/2020   TRIG 186 (H) 11/06/2020   CHOLHDL 4.0 11/06/2020    2.  Dyslipidemia.  Placed on high intensity statin.  3.  R. ICA significant stenosis  with right MCA stroke.  On dual antiplatelet therapy and  statin, vascular surgery consulted for possible surgery. Type screen, EKG NSR, check INR, TTE pending.  4. HTN - permissive due to Acute CVA, gentle titration from 11/08/20       Condition - Extremely Guarded  Family Communication  :  grand son bedside 11/07/20  Code Status :  Full  Consults  :  Neuro, VVS  PUD Prophylaxis : PPI   Procedures  :     MRI - 1. Subacute right MCA infarcts. 2. Mild chronic small vessel ischemic disease  CTA - 1. No intracranial arterial occlusion or high-grade stenosis. 2. 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 Aortic Atherosclerosis (ICD10-I70.0).   Carotid US - 1. No intracranial arterial occlusion or high-grade stenosis. 2. 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 Aortic Atherosclerosis.  TTE      Disposition Plan  :    Status is: Inpatient  Remains inpatient appropriate because:IV treatments appropriate due to intensity of illness or inability to take PO  Dispo: The patient is from: Home              Anticipated d/c is to: Home              Patient currently is not medically stable to d/c.   Difficult to place patient No  DVT Prophylaxis  :    enoxaparin (LOVENOX) injection 30 mg Start: 11/05/20 2200 SCD's Start: 11/05/20 1955     Lab Results  Component Value Date   PLT 213 11/05/2020    Diet :  Diet Order             Diet regular Room service appropriate? Yes; Fluid consistency: Thin  Diet effective now                    Inpatient Medications  Scheduled Meds:   stroke: mapping our early stages of recovery book   Does not apply Once   amitriptyline  50 mg Oral QHS   aspirin EC  81 mg Oral Daily   atorvastatin  80 mg Oral Daily   clopidogrel  75 mg Oral Daily   enoxaparin (LOVENOX) injection  30 mg Subcutaneous Q24H   pantoprazole  40  mg Oral q AM   Continuous Infusions: PRN Meds:.[DISCONTINUED] acetaminophen **OR** [DISCONTINUED] acetaminophen (TYLENOL) oral liquid 160 mg/5 mL **OR** acetaminophen, acetaminophen, meclizine, senna-docusate  Antibiotics  :    Anti-infectives (From admission, onward)    None        Time Spent in minutes  30   Lala Lund M.D on 11/07/2020 at 10:32 AM  To page go to [email or web address]   [facility] -  Office  [phone]    See all Orders from today for further details    Objective:   Vitals:   11/06/20 1926 11/06/20 2343 11/07/20 0323 11/07/20 0844  BP: (!) 145/84 125/69 127/70 (!) 150/83  Pulse: 83 72 72 70  Resp: '16 18 18 16  '$ Temp: (!) 97.5 F (36.4 C) (!) 97.5 F (36.4 C) 98.9 F (37.2 C) 97.6 F (36.4 C)  TempSrc: Oral Oral  Oral  SpO2: 97% 95% 95%   Weight:      Height:        Wt Readings from Last 3 Encounters:  11/06/20 45.5 kg  07/10/19 45.5 kg  07/11/18 43.5 kg     Intake/Output Summary (Last 24 hours) at 11/07/2020 1032 Last data filed at 11/06/2020  1856 Gross per 24 hour  Intake 240 ml  Output --  Net 240 ml     Physical Exam  Awake Alert, No new F.N deficits, continues to have some left-sided weakness arm more than leg [name],[name] Supple Neck,No JVD, No cervical lymphadenopathy appriciated.  Symmetrical Chest wall movement, Good air movement bilaterally, CTAB RRR,No Gallops,Rubs or new Murmurs, No Parasternal Heave +ve B.Sounds, Abd Soft, No tenderness, No organomegaly appriciated, No rebound - guarding or rigidity. No Cyanosis, Clubbing or edema, No new Rash or bruise         Data Review:    CBC Recent Labs  Lab 11/05/20 1731  WBC 8.2  HGB 15.4*  HCT 45.0  PLT 213  MCV 86.4  MCH 29.6  MCHC 34.2  RDW 12.1  LYMPHSABS 1.4  MONOABS 0.6  EOSABS 0.1  BASOSABS 0.0    Recent Labs  Lab 11/05/20 1731 11/06/20 0500  NA 142  --   K 3.4*  --   CL 108  --   CO2 22  --   GLUCOSE 125*  --   BUN 15  --    CREATININE 0.67  --   CALCIUM 9.5  --   AST 23  --   ALT 20  --   ALKPHOS 73  --   BILITOT 0.9  --   ALBUMIN 4.6  --   INR 1.0  --   HGBA1C  --  6.0*    ------------------------------------------------------------------------------------------------------------------ Recent Labs    11/06/20 0500  CHOL 235*  HDL 59  LDLCALC 139*  TRIG 186*  CHOLHDL 4.0    Lab Results  Component Value Date   HGBA1C 6.0 (H) 11/06/2020   ------------------------------------------------------------------------------------------------------------------ No results for input(s): TSH, T4TOTAL, T3FREE, THYROIDAB in the last 72 hours.  Invalid input(s): FREET3  Cardiac Enzymes No results for input(s): CKMB, TROPONINI, MYOGLOBIN in the last 168 hours.  Invalid input(s): CK ------------------------------------------------------------------------------------------------------------------ No results found for: BNP  Micro Results Recent Results (from the past 240 hour(s))  Resp Panel by RT-PCR (Flu A&B, Covid) Nasopharyngeal Swab     Status: None   Collection Time: 11/05/20  5:48 PM   Specimen: Nasopharyngeal Swab; Nasopharyngeal(NP) swabs in vial transport medium  Result Value Ref Range Status   SARS Coronavirus 2 by RT PCR NEGATIVE NEGATIVE Final    Comment: (NOTE) SARS-CoV-2 target nucleic acids are NOT DETECTED.  The SARS-CoV-2 RNA is generally detectable in upper respiratory specimens during the acute phase of infection. The lowest concentration of SARS-CoV-2 viral copies this assay can detect is 138 copies/mL. A negative result does not preclude SARS-Cov-2 infection and should not be used as the sole basis for treatment or other patient management decisions. A negative result may occur with  improper specimen collection/handling, submission of specimen other than nasopharyngeal swab, presence of viral mutation(s) within the areas targeted by this assay, and inadequate number of  viral copies(<138 copies/mL). A negative result must be combined with clinical observations, patient history, and epidemiological information. The expected result is Negative.  Fact Sheet for Patients:  EntrepreneurPulse.com.au  Fact Sheet for Healthcare Providers:  IncredibleEmployment.be  This test is no t yet approved or cleared by the [place] FDA and  has been authorized for detection and/or diagnosis of SARS-CoV-2 by FDA under an Emergency Use Authorization (EUA). This EUA will remain  in effect (meaning this test can be used) for the duration of the COVID-19 declaration under Section 564(b)(1) of the Act, 21 U.S.C.section 360bbb-3(b)(1), unless the authorization is  terminated  or revoked sooner.       Influenza A by PCR NEGATIVE NEGATIVE Final   Influenza B by PCR NEGATIVE NEGATIVE Final    Comment: (NOTE) The Xpert Xpress SARS-CoV-2/FLU/RSV plus assay is intended as an aid in the diagnosis of influenza from Nasopharyngeal swab specimens and should not be used as a sole basis for treatment. Nasal washings and aspirates are unacceptable for Xpert Xpress SARS-CoV-2/FLU/RSV testing.  Fact Sheet for Patients: EntrepreneurPulse.com.au  Fact Sheet for Healthcare Providers: IncredibleEmployment.be  This test is not yet approved or cleared by the [place] FDA and has been authorized for detection and/or diagnosis of SARS-CoV-2 by FDA under an Emergency Use Authorization (EUA). This EUA will remain in effect (meaning this test can be used) for the duration of the COVID-19 declaration under Section 564(b)(1) of the Act, 21 U.S.C. section 360bbb-3(b)(1), unless the authorization is terminated or revoked.  Performed at [facility], [place] [address] [place], [place] [address]     Radiology Reports CT ANGIO HEAD NECK W WO CM  Result Date: 11/05/2020 CLINICAL DATA:  Ataxia,  stroke suspected EXAM: CT ANGIOGRAPHY HEAD AND NECK TECHNIQUE: Multidetector CT imaging of the head and neck was performed using the standard protocol during bolus administration of intravenous contrast. Multiplanar CT image reconstructions and MIPs were obtained to evaluate the vascular anatomy. Carotid stenosis measurements (when applicable) are obtained utilizing NASCET criteria, using the distal internal carotid diameter as the denominator. CONTRAST:  44m OMNIPAQUE IOHEXOL 350 MG/ML SOLN COMPARISON:  None. FINDINGS: CT HEAD FINDINGS Brain: There is no mass, hemorrhage or extra-axial collection. The size and configuration of the ventricles and extra-axial CSF spaces are normal. There is no acute or chronic infarction. The brain parenchyma is normal. Skull: The visualized skull base, calvarium and extracranial soft tissues are normal. Sinuses/Orbits: No fluid levels or advanced mucosal thickening of the visualized paranasal sinuses. No mastoid or middle ear effusion. The orbits are normal. CTA NECK FINDINGS SKELETON: There is no bony spinal canal stenosis. No lytic or blastic lesion. OTHER NECK: Normal pharynx, larynx and major salivary glands. No cervical lymphadenopathy. Unremarkable thyroid gland. UPPER CHEST: No pneumothorax or pleural effusion. No nodules or masses. AORTIC ARCH: There is calcific atherosclerosis of the aortic arch. There is no aneurysm, dissection or hemodynamically significant stenosis of the visualized portion of the aorta. Conventional 3 vessel aortic branching pattern. The visualized proximal subclavian arteries are widely patent. RIGHT CAROTID SYSTEM: No dissection, occlusion or aneurysm. There is mixed density atherosclerosis extending into the proximal ICA, resulting in 80% stenosis. LEFT CAROTID SYSTEM: Normal without aneurysm, dissection or stenosis. VERTEBRAL ARTERIES: Left dominant configuration. Both origins are clearly patent. There is no dissection, occlusion or flow-limiting  stenosis to the skull base (V1-V3 segments). CTA HEAD FINDINGS POSTERIOR CIRCULATION: --Vertebral arteries: Normal V4 segments. --Inferior cerebellar arteries: Normal. --Basilar artery: Normal. --Superior cerebellar arteries: Normal. --Posterior cerebral arteries (PCA): Normal. ANTERIOR CIRCULATION: --Intracranial internal carotid arteries: Normal. --Anterior cerebral arteries (ACA): Normal. Both A1 segments are present. Patent anterior communicating artery (a-comm). --Middle cerebral arteries (MCA): Normal. VENOUS SINUSES: As permitted by contrast timing, patent. ANATOMIC VARIANTS: None Review of the MIP images confirms the above findings. IMPRESSION: 1. No intracranial arterial occlusion or high-grade stenosis. 2. 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 Aortic Atherosclerosis (ICD10-I70.0). Electronically Signed   By: KUlyses JarredM.D.   On: 11/05/2020 19:58   DG Chest 2 View  Result Date: 11/05/2020 CLINICAL DATA:  Stroke, left arm weakness EXAM: CHEST - 2 VIEW COMPARISON:  03/24/2015 FINDINGS: The heart size and mediastinal contours are within normal limits. Both lungs are clear. The visualized skeletal structures are unremarkable. IMPRESSION: No active cardiopulmonary disease. Electronically Signed   By: Fidela Salisbury MD   On: 11/05/2020 20:32   MR BRAIN W WO CONTRAST  Result Date: 11/05/2020 CLINICAL DATA:  Essential hypertension.  Headache. EXAM: MRI HEAD WITHOUT AND WITH CONTRAST TECHNIQUE: Multiplanar, multiecho pulse sequences of the brain and surrounding structures were obtained without and with intravenous contrast. CONTRAST:  22m MULTIHANCE GADOBENATE DIMEGLUMINE 529 MG/ML IV SOLN COMPARISON:  06/09/2008 FINDINGS: Brain: There are multiple small regions of diffusion weighted signal abnormality in the right frontal and parietal lobes involving white matter of the centrum semiovale as well as cortex and subcortical white matter  towards the vertex compatible with subacute infarcts (MCA and deep watershed territory). There is associated gyriform enhancement most notable in the parietal lobe where there is also a small focus of petechial hemorrhage. Small T2 hyperintensities elsewhere in the cerebral white matter bilaterally are nonspecific but compatible with mild chronic small vessel ischemic disease. There is mild cerebral atrophy. No mass, midline shift, or extra-axial fluid collection is identified. Vascular: Major intracranial vascular flow voids are preserved. Skull and upper cervical spine: Unremarkable bone marrow signal. Sinuses/Orbits: Bilateral cataract extraction. Paranasal sinuses and mastoid air cells are clear. Other: None. IMPRESSION: 1. Subacute right MCA infarcts. 2. Mild chronic small vessel ischemic disease. Electronically Signed   By: ALogan BoresM.D.   On: 11/05/2020 13:36   [facility] [place]  Result Date: 11/06/2020 Carotid Arterial Duplex Study Patient Name:  KLANESIA COCKRILLTRobinson Date of Exam:   11/06/2020 Medical Rec #: [id number]            Accession #:    [id number] of Birth: 112-21-1947           Patient Gender: F Patient Age:   74Y [facility]      [facility] [place] Referring Phys: [id number]--------------------------------------------------------------------------------  Indications:       CVA. Risk Factors:      None. Comparison Study:  11/05/2020 - CT ANGIO HEAD NECK W WO CM                    IMPRESSION:                    1. No intracranial arterial occlusion or high-grade stenosis.                    2. 80% stenosis of the proximal right internal carotid artery                    secondary to mixed density atherosclerosis. Performing Technologist: GOliver HumRVT  Examination Guidelines: A complete evaluation includes B-mode imaging, spectral Doppler, color Doppler, and power Doppler as needed of all accessible portions of each  vessel. Bilateral testing is considered an integral part of a complete examination. Limited examinations for reoccurring indications may be performed as noted.  Right Carotid Findings: +----------+--------+--------+--------+--------------------------+--------+           PSV cm/sEDV cm/sStenosisPlaque Description        Comments +----------+--------+--------+--------+--------------------------+--------+ CCA Prox  98      16  smooth and heterogenous            +----------+--------+--------+--------+--------------------------+--------+ CCA Distal39      10              smooth and heterogenous            +----------+--------+--------+--------+--------------------------+--------+ ICA Prox  374     162     80-99%  irregular and heterogenous         +----------+--------+--------+--------+--------------------------+--------+ ICA Mid   110     28              smooth and heterogenous            +----------+--------+--------+--------+--------------------------+--------+ ICA Distal62      22                                        tortuous +----------+--------+--------+--------+--------------------------+--------+ ECA       116     9                                                  +----------+--------+--------+--------+--------------------------+--------+ +----------+--------+-------+--------+-------------------+           PSV cm/sEDV cmsDescribeArm Pressure (mmHG) +----------+--------+-------+--------+-------------------+ [id number]                                         +----------+--------+-------+--------+-------------------+ +---------+--------+--+--------+--+---------+ VertebralPSV cm/s40EDV cm/s12Antegrade +---------+--------+--+--------+--+---------+  Left Carotid Findings: +----------+--------+--------+--------+-----------------------+--------+           PSV cm/sEDV cm/sStenosisPlaque Description     Comments  +----------+--------+--------+--------+-----------------------+--------+ CCA Prox  70      14              smooth and heterogenous         +----------+--------+--------+--------+-----------------------+--------+ CCA Distal47      14              smooth and heterogenous         +----------+--------+--------+--------+-----------------------+--------+ ICA Prox  49      17              smooth and heterogenous         +----------+--------+--------+--------+-----------------------+--------+ ICA Distal68      24                                     tortuous +----------+--------+--------+--------+-----------------------+--------+ ECA       60      6                                               +----------+--------+--------+--------+-----------------------+--------+ +----------+--------+--------+--------+-------------------+           PSV cm/sEDV cm/sDescribeArm Pressure (mmHG) +----------+--------+--------+--------+-------------------+ [id number]                                         +----------+--------+--------+--------+-------------------+ +---------+--------+--+--------+--+---------+ VertebralPSV cm/s40EDV cm/s13Antegrade +---------+--------+--+--------+--+---------+   Summary: Right Carotid: Velocities in the  right ICA are consistent with a 80-99%                stenosis. Left Carotid: Velocities in the left ICA are consistent with a 1-39% stenosis. Vertebrals: Bilateral vertebral arteries demonstrate antegrade flow. *See table(s) above for measurements and observations.  Electronically signed by Antony Contras MD on 11/06/2020 at 1:00:26 PM.    Final

## 2020-11-07 NOTE — Plan of Care (Signed)
  Problem: Education: Goal: Knowledge of disease or condition will improve Outcome: Progressing Goal: Knowledge of secondary prevention will improve Outcome: Progressing Goal: Knowledge of patient specific risk factors addressed and post discharge goals established will improve Outcome: Progressing   Problem: Coping: Goal: Will identify appropriate support needs Outcome: Progressing   Problem: Self-Care: Goal: Ability to participate in self-care as condition permits will improve Outcome: Progressing Goal: Verbalization of feelings and concerns over difficulty with self-care will improve Outcome: Progressing

## 2020-11-07 NOTE — Progress Notes (Signed)
Initial Nutrition Assessment  DOCUMENTATION CODES:   Severe malnutrition in context of chronic illness  INTERVENTION:  -Ensure Enlive po BID, each supplement provides 350 kcal and 20 grams of protein -MVI with minerals daily  NUTRITION DIAGNOSIS:   Severe Malnutrition related to chronic illness as evidenced by severe muscle depletion, severe fat depletion.  GOAL:   Patient will meet greater than or equal to 90% of their needs  MONITOR:   PO intake, Supplement acceptance, Labs, Weight trends, I & O's  REASON FOR ASSESSMENT:   Malnutrition Screening Tool    ASSESSMENT:   Pt with a PMH significant for HTN, GERD, vertigo who was in her usual state of health until ~ 1 month ago when she developed sudden onset of headache associated with L arm weakness and bilateral lower extremity weakness, work-up revealed R MCA CVA.  Pt sleeping at time of RD visit and did not wake to RD voice/touch. Per RN, pt denies N/V/abdominal pain.   No significant weight changes noted per weight history.   PO intake: 90% x1 recorded meal  No UOP documented x24 hours  Labs and medications reviewed.   NUTRITION - FOCUSED PHYSICAL EXAM:  Flowsheet Row Most Recent Value  Orbital Region Severe depletion  Upper Arm Region Severe depletion  Thoracic and Lumbar Region Severe depletion  Buccal Region Severe depletion  Temple Region Severe depletion  Clavicle Bone Region Severe depletion  Clavicle and Acromion Bone Region Severe depletion  Scapular Bone Region Severe depletion  Dorsal Hand Severe depletion  Patellar Region Severe depletion  Anterior Thigh Region Severe depletion  Posterior Calf Region Severe depletion  Edema (RD Assessment) None  Hair Reviewed  Eyes Reviewed  Mouth Reviewed  Skin Reviewed  Nails Reviewed       Diet Order:   Diet Order             Diet regular Room service appropriate? Yes; Fluid consistency: Thin  Diet effective now                   EDUCATION  NEEDS:   No education needs have been identified at this time  Skin:  Skin Assessment: Reviewed RN Assessment  Last BM:  7/21  Height:   Ht Readings from Last 1 Encounters:  11/06/20 4' 11.5" (1.511 m)    Weight:   Wt Readings from Last 10 Encounters:  11/06/20 45.5 kg  07/10/19 45.5 kg  07/11/18 43.5 kg  04/06/18 42.4 kg  05/03/16 43.6 kg  02/24/16 43.2 kg  01/08/16 42.4 kg  03/12/14 38.6 kg  02/19/14 43.1 kg  12/17/13 44.6 kg   BMI:  Body mass index is 19.92 kg/m.  Estimated Nutritional Needs:   Kcal:  1400-1600  Protein:  70-80 grams  Fluid:  >1.4L/d    Larkin Ina, MS, RD, LDN (she/her/hers) RD pager number and weekend/on-call pager number located in [place].

## 2020-11-07 NOTE — TOC Transition Note (Signed)
Transition of Care [facility]) - CM/SW Discharge Note   Patient Details  Name: Jackie Russman MRN: [id number] Date of Birth: May 01, 1945  [facility] CM/SW Contact:  Pollie Friar, RN Phone Number: 11/07/2020, 12:07 PM   Clinical Narrative:    Patient is from home with family and has 24 hour supervision. No current DME. Youth walker ordered through [place] and will be delivered to the room. Pt with recommendations for [facility] services. CM met with the family and granddaughter provided the interpreter services. They have no preference for [facility] provider. CM has arranged [facility]. Information on the AVS. Family denies issues with home meds and transportation. Family will transport home.   Final next level of care: Home w Home Health Services Barriers to Discharge: No Barriers Identified   Patient Goals and CMS Choice   CMS Medicare.gov Compare Post Acute Care list provided to:: Patient Represenative (must comment) Choice offered to / list presented to : Adult Children  Discharge Placement                       Discharge Plan and Services                DME Arranged: Walker youth DME Agency: [employer] Date DME Agency Contacted: 11/07/20   Representative spoke with at DME Agency: Freda Munro HH Arranged: PT, OT [facility] Agency: [facility] Date [place]: 11/07/20   Representative spoke with at [place]: [place] ([place]) Interventions     Readmission Risk Interventions No flowsheet data found.

## 2020-11-07 NOTE — Progress Notes (Addendum)
STROKE TEAM PROGRESS NOTE   INTERVAL HISTORY  Presented yesterday after an outpatient MRI showed multiple subacute strokes.   Interpreter used for today's visit.  The brain MRI had been ordered by her PCP due to 1 month history of persistent headaches and LUE weakness. She was also noted to have some orthostatic dizziness. She notes that this has also been going on for quite awhile. When she stands up, she develops fatigue and bilateral lower extremity weakness.  She notes that she was prescribed amitriptyline for headaches by her PCP however stopped taking this as it was making her too tired.   She was seen by vascular surgery this morning. They plan to have her follow up with them next week for revascularization.   CBC:  Recent Labs  Lab 11/05/20 1731  WBC 8.2  NEUTROABS 6.1  HGB 15.4*  HCT 45.0  MCV 86.4  PLT 123456   Basic Metabolic Panel:  Recent Labs  Lab 11/05/20 1731  NA 142  K 3.4*  CL 108  CO2 22  GLUCOSE 125*  BUN 15  CREATININE 0.67  CALCIUM 9.5   Lipid Panel:  Recent Labs  Lab 11/06/20 0500  CHOL 235*  TRIG 186*  HDL 59  CHOLHDL 4.0  VLDL 37  LDLCALC 139*   HgbA1c:  Recent Labs  Lab 11/06/20 0500  HGBA1C 6.0*   Urine Drug Screen:  Recent Labs  Lab 11/05/20 1930  LABOPIA NONE DETECTED  COCAINSCRNUR NONE DETECTED  LABBENZ NONE DETECTED  AMPHETMU NONE DETECTED  THCU NONE DETECTED  LABBARB NONE DETECTED    MRI brain: subacute right MCA infarcts involving the right frontal and parietal lobes  CTA head/neck: no LVO. 80% stenosis of the proximal right ICA  Carotid ultrasound: right ICA stenosis 80-99%. Left ICA stenosis 1-39%. Vertebral arteries patent.  PHYSICAL EXAM Blood pressure 134/81, pulse 81, temperature 97.8 F (36.6 C), temperature source Oral, resp. rate 14, height 4' 11.5" (1.511 m), weight 45.5 kg, SpO2 98 %.   General: frail appearing f elderly [place] emale Cardiac: RRR  Neurologic exam: Mental status: alert and  oriented.  Speech and language appear intact while speaking to the interpreter.  Follows commands quite well. Cranial nerves: EOM intact. Peripheral vision intact. Face symmetric. Hearing intact.  Motor: all extremities have antigravity movement. Left upper extremity is weaker compared to the left. Pronator drift in the left upper extremity is present.  Left grip is weak.  Fine finger movements are diminished on the left and orbits right over left upper extremity.  5/5 strength in the bilateral lower extremities.  Decreased fine motor movement on the left upper extremity. Sensation: diminished sensation in the left upper extremity.  Gait deferred  ASSESSMENT/PLAN Ms. Jennaveve Salters Amelia Jo Grosman is a 75 y.o. female with hypertension and recent history of vertigo which was being worked up by PCP with MRI. MRI revealed multiple cortical infarcts in right MCA distribution so she was admitted for stroke workup.  She did not receive tPA as infarcts were subacute  Subacute cortical and subcortical right MCA infarcts involving the frontal and parietal lobes secondary to symptomatic Severe right ICA stenosis  MRI brain: subacute right MCA infarcts involving the right frontal and parietal lobes CTA head/neck: no LVO. 80% stenosis of the proximal right ICA Carotid ultrasound: right ICA stenosis 80-99%. Left ICA stenosis 1-39%. Vertebral arteries patent. 2D Echo pending LDL 139 HgbA1c 6.0 Plan Follow up with vascular surgery next week.  Systolic blood pressure goal is 160-177mHg due to  right carotid stenosis Continue plavix and aspirin Therapy recommendations:  PT/OT home health; 24h supervision  Hypertension. Blood pressure management as noted above.   Hyperlipidemia. LDL 139, goal < 70 Start lipitor '80mg'$  daily  Prediabetes. A1C 6.0.  Discussed lifestyle modifications Follow up with PCP for ongoing monitoring  Hospital day # [place], MD Internal Medicine Resident PGY-3 [facility]  Internal Medicine Residency Pager: [phone] 11/07/2020 9:16 AM    Stroke MD Note : I have personally obtained history,examined this patient, reviewed notes, independently viewed imaging studies, participated in medical decision making and plan of care.ROS completed by me personally and pertinent positives fully documented  I have made any additions or clarifications directly to the above note. Agree with note above.  Patient presented with 1 month history of headaches as well as 1 week of left upper extremity weakness likely due to symptomatic severe proximal right internal carotid artery stenosis.  She will benefit with early revascularization with TCAR procedure which is planned for next week by vascular surgery.  Recommend aspirin and Plavix dual antiplatelet therapy to the procedure and then aspirin alone after that.  Aggressive risk factor modification.  Continue ongoing physical occupational therapy.  Discussed with patient using [place] language interpreter and with Dr. Candiss Norse and vascular surgery.  Greater than 50% time during this 35-minute visit was spent in counseling and coordination of care and discussion with care team.  Antony Contras, MD Medical Director [place] Pager: [phone] 11/07/2020 1:10 PM   To contact Stroke Continuity provider, please refer to [email or web address] After hours, contact General Neurology

## 2020-11-07 NOTE — Consult Note (Signed)
[facility] [place]  ASSESSMENT / PLAN: [name] is a 75 y.o. female with symptomatic right 65 - 99 % carotid artery stenosis.   The patient's carotid artery stenosis merits consideration of revascularization to reduce the risk of future stroke because of carotid stenosis >50% with symptoms of TIA/CVA attributable to carotid lesion. I quoted the patient risk reduction from intervention of ~17% for symptomatic stenosis based on data from the NASCET trial.  The patient is a good candidate for transcarotid artery revascularization (TCAR) because of high cervical carotid artery stenosis (above C2).   The patient should continue best medical therapy for carotid artery stenosis including: Complete cessation from all tobacco products. Blood glucose control with goal A1c < 7%. Blood pressure control with goal blood pressure < 140/90 mmHg. Lipid reduction therapy with goal LDL-C <100 mg/dL (<70 if symptomatic from carotid artery stenosis).  Aspirin '81mg'$  PO QD.  Clopidogrel '75mg'$  PO QD. Atorvastatin 40-'80mg'$  PO QD (or other "high intensity" statin therapy).  It is critical she start dual antiplatelet therapy and statin therapy now and continue this through surgery to make TCAR as safe as possible.   Will arrange for R TCAR next week.   CHIEF COMPLAINT: left arm weakness  HISTORY OF PRESENT ILLNESS: [name] is a 75 y.o. female admitted to the internal medicine service for treatment after acute right MCA stroke.  The patient speaks via the means.  She speaks minimal [place].  Her son is at bedside and helps with the history.  Per him she has developed episodic dizziness and new left-sided weakness over the past month.  This became worse prompting her presentation to the ER.  A CT angiogram was performed of the head and neck revealing severe right ICA stenosis (greater than 80%.  MRI shows multifocal right MCA territory strokes.  The entire picture is  concerning for symptomatic carotid lesion.  I spent greater than 20 minutes discussing the natural history of symptomatic carotid stenosis, and the options for treatment.  Given her high bifurcation, TCAR is probably the best option for her.  VASCULAR SURGICAL HISTORY: none  VASCULAR RISK FACTORS: Positive history of cerebrovascular disease / stroke / transient ischemic attack. Negative history of coronary artery disease. no history of PCI. no history of CABG.  Negative history of diabetes mellitus. Last A1c 6.0 - "prediabetes". Positive history of smoking. Not actively smoking. Positive history of hypertension.  Negative history of chronic kidney disease. Last GFR >60.  Negative history of chronic obstructive pulmonary disease.  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   GERD (gastroesophageal reflux disease)    Hypertension    Internal hemorrhoids    Vertigo     Past Surgical History:  Procedure Laterality Date   BREAST BIOPSY Right 2009   CHOLECYSTECTOMY     COLONOSCOPY     ERCP  01/21/2012   Procedure: ENDOSCOPIC RETROGRADE CHOLANGIOPANCREATOGRAPHY (ERCP);  Surgeon: Gatha Mayer, MD;  Location: [facility];  Service: Endoscopy;  Laterality: N/A;  Needs interpreter- vietnanese   ESOPHAGOGASTRODUODENOSCOPY     gist resection      Family History  Problem Relation Age of Onset   Colon cancer Neg Hx     Social History   Socioeconomic History   Marital status:  Widowed    Spouse name: Not on file   Number of children: 1   Years of education: Not on file   Highest education level: Not on file  Occupational History   Occupation: UNEMPLOYED    Employer: UNEMPLOYED  Tobacco Use   Smoking status: Former    Packs/day: 0.50    Years: 40.00    Pack years: 20.00    Types: Cigarettes    Quit date:  04/07/2003    Years since quitting: 17.6   Smokeless tobacco: Never  Vaping Use   Vaping Use: Never used  Substance and Sexual Activity   Alcohol use: No   Drug use: No   Sexual activity: Not on file  Other Topics Concern   Not on file  Social History Narrative   Widowed   1 child   Former smoker   No EtOH/drugs      Social Determinants of Radio broadcast assistant Strain: Not on file  Food Insecurity: Not on file  Transportation Needs: Not on file  Physical Activity: Not on file  Stress: Not on file  Social Connections: Not on file  Intimate Partner Violence: Not on file    No Known Allergies  Current Facility-Administered Medications  Medication Dose Route Frequency Provider Last Rate Last Admin    stroke: mapping our early stages of recovery book   Does not apply Once [employer], Alexis, DO       acetaminophen (TYLENOL) suppository 650 mg  650 mg Rectal Q4H PRN Hugelmeyer, Alexis, DO       acetaminophen (TYLENOL) tablet 650 mg  650 mg Oral Q6H PRN Thurnell Lose, MD   650 mg at 11/07/20 [id number]   amitriptyline (ELAVIL) tablet 50 mg  50 mg Oral QHS Barb Merino, MD   50 mg at 11/06/20 2141   aspirin EC tablet 81 mg  81 mg Oral Daily Hugelmeyer, Alexis, DO   81 mg at 11/07/20 [id number]   atorvastatin (LIPITOR) tablet 80 mg  80 mg Oral Daily Barb Merino, MD   80 mg at 11/07/20 0952   enoxaparin (LOVENOX) injection 30 mg  30 mg Subcutaneous Q24H Hugelmeyer, Alexis, DO   30 mg at 11/06/20 2141   meclizine (ANTIVERT) tablet 12.5 mg  12.5 mg Oral BID PRN Barb Merino, MD       pantoprazole (PROTONIX) EC tablet 40 mg  40 mg Oral q AM Barb Merino, MD   40 mg at 11/07/20 0703   senna-docusate (Senokot-S) tablet 1 tablet  1 tablet Oral QHS PRN Hugelmeyer, Alexis, DO        REVIEW OF SYSTEMS:  '[X]'$  denotes positive finding, '[ ]'$  denotes negative finding Cardiac  Comments:  Chest pain or chest pressure:    Shortness of breath upon exertion:    Short of breath when lying  flat:    Irregular heart rhythm:        Vascular    Pain in calf, thigh, or hip brought on by ambulation:    Pain in feet at night that wakes you up from your sleep:     Blood clot in your veins:    Leg swelling:         Pulmonary    Oxygen at home:    Productive cough:     Wheezing:         Neurologic    Sudden weakness in arms or legs:     Sudden numbness in arms or legs:     Sudden  onset of difficulty speaking or slurred speech:    Temporary loss of vision in one eye:     Problems with dizziness:         Gastrointestinal    Blood in stool:     Vomited blood:         Genitourinary    Burning when urinating:     Blood in urine:        Psychiatric    Major depression:         Hematologic    Bleeding problems:    Problems with blood clotting too easily:        Skin    Rashes or ulcers:        Constitutional    Fever or chills:      PHYSICAL EXAM Vitals:   11/06/20 1926 11/06/20 2343 11/07/20 0323 11/07/20 0844  BP: (!) 145/84 125/69 127/70 (!) 150/83  Pulse: 83 72 72 70  Resp: '16 18 18 16  '$ Temp: (!) 97.5 F (36.4 C) (!) 97.5 F (36.4 C) 98.9 F (37.2 C) 97.6 F (36.4 C)  TempSrc: Oral Oral  Oral  SpO2: 97% 95% 95%   Weight:      Height:        Constitutional: well appearing. no distress. Appears well nourished.  Neurologic: CN intact. LUE 4/5. Otherwise 5/5. no sensory loss. Psychiatric:  Mood and affect symmetric and appropriate. Eyes:  No icterus. No conjunctival pallor. Ears, nose, throat:  mucous membranes moist. Midline trachea.  Cardiac: regular rate and rhythm.  Respiratory:  unlabored. Abdominal:  soft, non-distended.  Peripheral vascular: 2+ radial pulses Extremity: no edema. no cyanosis. no pallor.  Skin: no gangrene. no ulceration.  Lymphatic: no Stemmer's sign. no palpable lymphadenopathy.  PERTINENT LABORATORY AND RADIOLOGIC DATA  Most recent CBC CBC Latest Ref Rng & Units 11/05/2020 04/06/2018 02/19/2014  WBC 4.0 - 10.5 K/uL 8.2  5.9 6.0  Hemoglobin 12.0 - 15.0 g/dL 15.4(H) 15.5(H) 15.2(H)  Hematocrit 36.0 - 46.0 % 45.0 46.0 45.2  Platelets 150 - 400 K/uL 213 237.0 231.0     Most recent CMP CMP Latest Ref Rng & Units 11/05/2020 04/06/2018 02/19/2014  Glucose 70 - 99 mg/dL 125(H) 116(H) 111(H)  BUN 8 - 23 mg/dL '15 12 23  '$ Creatinine 0.44 - 1.00 mg/dL 0.67 0.71 0.8  Sodium 135 - 145 mmol/L 142 141 140  Potassium 3.5 - 5.1 mmol/L 3.4(L) 3.7 4.8  Chloride 98 - 111 mmol/L 108 105 107  CO2 22 - 32 mmol/L '22 28 26  '$ Calcium 8.9 - 10.3 mg/dL 9.5 9.2 9.8  Total Protein 6.5 - 8.1 g/dL 7.9 7.6 7.6  Total Bilirubin 0.3 - 1.2 mg/dL 0.9 0.7 1.4(H)  Alkaline Phos 38 - 126 U/L 73 71 99  AST 15 - 41 U/L '23 18 20  '$ ALT 0 - 44 U/L '20 14 17    '$ Renal function Estimated Creatinine Clearance: 43.9 mL/min (by C-G formula based on SCr of 0.67 mg/dL).  Hgb A1c MFr Bld (%)  Date Value  11/06/2020 6.0 (H)    LDL Cholesterol  Date Value Ref Range Status  11/06/2020 139 (H) 0 - 99 mg/dL Final    Comment:           Total Cholesterol/HDL:CHD Risk Coronary Heart Disease Risk Table                     Men   Women  1/2 Average Risk   3.4   3.3  Average Risk       5.0   4.4  2 X Average Risk   9.6   7.1  3 X Average Risk  23.4   11.0        Use the calculated Patient Ratio above and the CHD Risk Table to determine the patient's CHD Risk.        ATP III CLASSIFICATION (LDL):  <100     mg/dL   Optimal  100-129  mg/dL   Near or Above                    Optimal  130-159  mg/dL   Borderline  160-189  mg/dL   High  >190     mg/dL   Very High Performed at [facility] [address] [place], [place] [address]      Vascular Imaging: CTA reviewed in detail.  Right ICA stenosis greater than 80%.  She has a high carotid bifurcation (lesion runs from C3 to C2).  [name]. Stanford Breed, MD [facility] Phone Number: [phone] 11/07/2020 10:06 AM

## 2020-11-08 ENCOUNTER — Inpatient Hospital Stay (HOSPITAL_COMMUNITY): Payer: Medicare Other

## 2020-11-08 DIAGNOSIS — I639 Cerebral infarction, unspecified: Secondary | ICD-10-CM | POA: Diagnosis not present

## 2020-11-08 DIAGNOSIS — I6389 Other cerebral infarction: Secondary | ICD-10-CM

## 2020-11-08 DIAGNOSIS — I6521 Occlusion and stenosis of right carotid artery: Secondary | ICD-10-CM | POA: Diagnosis not present

## 2020-11-08 DIAGNOSIS — I63511 Cerebral infarction due to unspecified occlusion or stenosis of right middle cerebral artery: Secondary | ICD-10-CM | POA: Diagnosis not present

## 2020-11-08 LAB — CBC WITH DIFFERENTIAL/PLATELET
Abs Immature Granulocytes: 0.03 10*3/uL (ref 0.00–0.07)
Basophils Absolute: 0 10*3/uL (ref 0.0–0.1)
Basophils Relative: 1 %
Eosinophils Absolute: 0.3 10*3/uL (ref 0.0–0.5)
Eosinophils Relative: 5 %
HCT: 42.9 % (ref 36.0–46.0)
Hemoglobin: 14.8 g/dL (ref 12.0–15.0)
Immature Granulocytes: 1 %
Lymphocytes Relative: 24 %
Lymphs Abs: 1.4 10*3/uL (ref 0.7–4.0)
MCH: 29.4 pg (ref 26.0–34.0)
MCHC: 34.5 g/dL (ref 30.0–36.0)
MCV: 85.1 fL (ref 80.0–100.0)
Monocytes Absolute: 0.4 10*3/uL (ref 0.1–1.0)
Monocytes Relative: 7 %
Neutro Abs: 3.7 10*3/uL (ref 1.7–7.7)
Neutrophils Relative %: 62 %
Platelets: 211 10*3/uL (ref 150–400)
RBC: 5.04 MIL/uL (ref 3.87–5.11)
RDW: 12.2 % (ref 11.5–15.5)
WBC: 5.8 10*3/uL (ref 4.0–10.5)
nRBC: 0 % (ref 0.0–0.2)

## 2020-11-08 LAB — COMPREHENSIVE METABOLIC PANEL
ALT: 26 U/L (ref 0–44)
AST: 28 U/L (ref 15–41)
Albumin: 3.4 g/dL — ABNORMAL LOW (ref 3.5–5.0)
Alkaline Phosphatase: 70 U/L (ref 38–126)
Anion gap: 8 (ref 5–15)
BUN: 29 mg/dL — ABNORMAL HIGH (ref 8–23)
CO2: 26 mmol/L (ref 22–32)
Calcium: 10 mg/dL (ref 8.9–10.3)
Chloride: 106 mmol/L (ref 98–111)
Creatinine, Ser: 0.97 mg/dL (ref 0.44–1.00)
GFR, Estimated: >60 mL/min (ref >60)
Glucose, Bld: 116 mg/dL — ABNORMAL HIGH (ref 70–99)
Potassium: 3.8 mmol/L (ref 3.5–5.1)
Sodium: 140 mmol/L (ref 135–145)
Total Bilirubin: 0.8 mg/dL (ref 0.3–1.2)
Total Protein: 6.1 g/dL — ABNORMAL LOW (ref 6.5–8.1)

## 2020-11-08 LAB — MAGNESIUM: Magnesium: 1.8 mg/dL (ref 1.7–2.4)

## 2020-11-08 LAB — ECHOCARDIOGRAM COMPLETE
AR max vel: 1.89 cm2
AV Area VTI: 1.82 cm2
AV Area mean vel: 1.9 cm2
AV Mean grad: 2 mmHg
AV Peak grad: 3.7 mmHg
Ao pk vel: 0.96 m/s
Area-P 1/2: 7.66 cm2
Height: 59.5 in
S' Lateral: 1.9 cm
Weight: 1604.95 oz

## 2020-11-08 NOTE — Progress Notes (Signed)
  Echocardiogram 2D Echocardiogram has been performed.  [name] 11/08/2020, 10:22 AM

## 2020-11-08 NOTE — Progress Notes (Signed)
Getting echo on my rounds. Plan R TCAR. I will try to arrange this with Dr. Carlis Abbott on Monday 7/23. Will check in again tomorrow to review the plan.  [name]. Stanford Breed, MD [facility] Phone Number: [phone] 11/08/2020 10:30 AM

## 2020-11-08 NOTE — Progress Notes (Signed)
Physical Therapy Treatment Patient Details Name: [name] MRN: [id number] DOB: 01/16/1946 Today's Date: 11/08/2020    History of Present Illness The pt is a 75 yo female presenting 7/20 after outpatient MRI revealed multiple subacute strokes. The pt reports she has been experiencing dizziness and weakness in LUE and bilateral LE for ~1 month prior to [place]. Work up revealed R MCA CVA, right 16 - 99 % carotid artery stenosis with planned revascularization next week. PMH includes: vertigo, HTN, and GERD.    PT Comments    Pt was able to ambulate up to ~90 ft with a RW and minA this date, displaying progress in endurance. However, she continues to ambulate at a very slow pace with decreased L foot clearance, placing her at risk for falls. In addition, pt became dizzy during gait bout, describing it as "spinning" in which she reported worsened with turning head to her L. Pt may benefit from a Vestibular PT Eval. In addition, pt may display some minor L neglect as she tends to bump her RW into obstacles on the L. Pt deferred further exercises or mobility due to increased headache pain and dizziness this date. Will continue to follow acutely. Current recommendations remain appropriate.    Follow Up Recommendations  Home health PT;Supervision/Assistance - 24 hour     Equipment Recommendations  Rolling walker with 5" wheels    Recommendations for Other Services       Precautions / Restrictions Precautions Precautions: Fall Restrictions Weight Bearing Restrictions: No    Mobility  Bed Mobility Overal bed mobility: Needs Assistance Bed Mobility: Supine to Sit;Sit to Supine     Supine to sit: Supervision;HOB elevated Sit to supine: Supervision;HOB elevated   General bed mobility comments: increased time, able to complete without assist with HOB elevated.    Transfers Overall transfer level: Needs assistance Equipment used: Rolling walker (2 wheeled) Transfers: Sit  to/from Stand Sit to Stand: Min guard         General transfer comment: minG for safety, no physical assist needed  Ambulation/Gait Ambulation/Gait assistance: Min assist Gait Distance (Feet): 90 Feet Assistive device: Rolling walker (2 wheeled);1 person hand held assist Gait Pattern/deviations: Step-through pattern;Decreased stride length;Narrow base of support;Decreased dorsiflexion - left Gait velocity: decreased Gait velocity interpretation: <1.31 ft/sec, indicative of household ambulator General Gait Details: Pt taking slow, small steps, displaying midfoot initial contact on L. Demonstrated heel strike with pt exaggerating afterwards, but improved L dorsiflexion. Pt with tendency to push RW distal to body, needing repeated cues to remain within it. Intermittent bumping into obstacles on L, needing cues to turn head to look at obstacle and correct RW. Dizziness ("spinning") limiting further mobility.   Stairs             Wheelchair Mobility    Modified Rankin (Stroke Patients Only) Modified Rankin (Stroke Patients Only) Pre-Morbid Rankin Score: Moderate disability Modified Rankin: Moderately severe disability     Balance Overall balance assessment: Needs assistance Sitting-balance support: No upper extremity supported Sitting balance-Leahy Scale: Good     Standing balance support: Bilateral upper extremity supported;During functional activity Standing balance-Leahy Scale: Poor Standing balance comment: Bil UE support for mobility.                            Cognition Arousal/Alertness: Awake/alert Behavior During Therapy: WFL for tasks assessed/performed Overall Cognitive Status: Difficult to assess  General Comments: pt following simple commands in english, attempted to use interpreter, but due to technical failure, unable to use for session (notified RN of issues with device). Poor carryover of education  provided by therapist during session. Possible L neglect or visual deficits as she would bump into obstacles on L occasionally.      Exercises      General Comments General comments (skin integrity, edema, etc.): BP 127/80, HR in 190s sitting when reported increased headache and warmth in R side of head after gait bout; reported dizziness as "spinning" when ambulating, which she reported increased when turning head towards L; unsure of L neglect vs visual deficits due to bumping into obstacles on L on occasion during session      Pertinent Vitals/Pain Pain Assessment: 0-10 Pain Score: 6  Pain Location: headache, R neck/ear, R lower back Pain Descriptors / Indicators: Discomfort;Grimacing;Guarding Pain Intervention(s): Limited activity within patient's tolerance;Monitored during session;Repositioned;Patient requesting pain meds-RN notified    Home Living                      Prior Function            PT Goals (current goals can now be found in the care plan section) Acute Rehab PT Goals Patient Stated Goal: to get better PT Goal Formulation: With patient/family Time For Goal Achievement: 11/20/20 Potential to Achieve Goals: Good Progress towards PT goals: Progressing toward goals    Frequency    Min 4X/week      PT Plan Current plan remains appropriate    Co-evaluation              AM-PAC PT "6 Clicks" Mobility   Outcome Measure  Help needed turning from your back to your side while in a flat bed without using bedrails?: A Little Help needed moving from lying on your back to sitting on the side of a flat bed without using bedrails?: A Little Help needed moving to and from a bed to a chair (including a wheelchair)?: A Little Help needed standing up from a chair using your arms (e.g., wheelchair or bedside chair)?: A Little Help needed to walk in hospital room?: A Little Help needed climbing 3-5 steps with a railing? : A Lot 6 Click Score: 17    End  of Session Equipment Utilized During Treatment: Gait belt Activity Tolerance: Patient tolerated treatment well Patient left: in bed;with call bell/phone within reach;with bed alarm set Nurse Communication: Mobility status;Patient requests pain meds;Other (comment) (dizziness, BP, L neglect vs visual deficits?) PT Visit Diagnosis: Unsteadiness on feet (R26.81);Muscle weakness (generalized) (M62.81)     Time: [id number] PT Time Calculation (min) (ACUTE ONLY): 26 min  Charges:  $Gait Training: 8-22 mins $Therapeutic Activity: 8-22 mins                     Moishe Spice, PT, DPT Acute Rehabilitation Services  Pager: [phone] Office: [place] 11/08/2020, 1:28 PM

## 2020-11-08 NOTE — Progress Notes (Signed)
PROGRESS NOTE                                                                                                                                                                                                             Patient Demographics:    [name], is a 75 y.o. female, DOB - 19-Apr-1946, [id number]  Outpatient Primary MD for the patient is Lavone Orn, MD    LOS - 3  Admit date - 11/05/2020    Chief Complaint  Patient presents with   Abnormal Lab       Brief Narrative (HPI from H&P) - [name] is a 75 y.o. female with a known history of hypertension, GERD, vertigo was in a usual state of health until 1 month ago when she developed sudden onset of headache associated with left arm weakness and bilateral lower extremity weakness, work-up showed right MCA CVA.   Subjective:   Patient in bed, appears comfortable, denies any headache, no fever, no chest pain or pressure, no shortness of breath , no abdominal pain. +ve mild L. weakness.   Assessment  & Plan :     Left-sided weakness arm more than leg due to acute Right MCA stroke.  With evidence of right ICA significant stenosis greater than 80%, stroke team following she is on dual antiplatelet therapy and high intensity statin will stroke work-up underway, LDL was above goal, A1c was stable, echo pending, DW VVS and  CVA team Dr Leonie Man, likely R.ICA surgery on 11/10/20. Post ICA surgery per VVS/Neuro (Per Neuro Plavix can be stopped). Keep SBP untreated unless >160 till ICA surgery.  on tele, obtain PT, OT, speech input, A1c and lipid panel, check MRI MRA brain, echogram, carotid duplex, neurology following   Lab Results  Component Value Date   HGBA1C 6.0 (H) 11/06/2020    Lab Results  Component Value Date   CHOL 235 (H) 11/06/2020   HDL 59 11/06/2020   LDLCALC 139 (H) 11/06/2020   TRIG 186 (H) 11/06/2020   CHOLHDL 4.0 11/06/2020    2.   Dyslipidemia.  Placed on high intensity statin.  3.  R. ICA significant  stenosis with right MCA stroke.  On dual antiplatelet therapy and statin, vascular surgery consulted for possible surgery. Type screen, EKG NSR, check INR, TTE pending.  4. HTN - permissive due to Acute CVA, gentle titration from 11/08/20       Condition - Extremely Guarded  Family Communication  :  grand son bedside 11/07/20  Code Status :  Full  Consults  :  Neuro, VVS  PUD Prophylaxis : PPI   Procedures  :     MRI - 1. Subacute right MCA infarcts. 2. Mild chronic small vessel ischemic disease  CTA - 1. No intracranial arterial occlusion or high-grade stenosis. 2. 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 Aortic Atherosclerosis (ICD10-I70.0).   Carotid US - 1. No intracranial arterial occlusion or high-grade stenosis. 2. 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 Aortic Atherosclerosis.  TTE      Disposition Plan  :    Status is: Inpatient  Remains inpatient appropriate because:IV treatments appropriate due to intensity of illness or inability to take PO  Dispo: The patient is from: Home              Anticipated d/c is to: Home              Patient currently is not medically stable to d/c.   Difficult to place patient No  DVT Prophylaxis  :    enoxaparin (LOVENOX) injection 30 mg Start: 11/05/20 2200 SCD's Start: 11/05/20 1955     Lab Results  Component Value Date   PLT 211 11/08/2020    Diet :  Diet Order             Diet regular Room service appropriate? Yes; Fluid consistency: Thin  Diet effective now                    Inpatient Medications  Scheduled Meds:   stroke: mapping our early stages of recovery book   Does not apply Once   aspirin EC  81 mg Oral Daily   atorvastatin  80 mg Oral Daily   clopidogrel  75 mg Oral Daily   enoxaparin (LOVENOX)  injection  30 mg Subcutaneous Q24H   feeding supplement  237 mL Oral BID BM   multivitamin with minerals  1 tablet Oral Daily   pantoprazole  40 mg Oral q AM   Continuous Infusions: PRN Meds:.[DISCONTINUED] acetaminophen **OR** [DISCONTINUED] acetaminophen (TYLENOL) oral liquid 160 mg/5 mL **OR** acetaminophen, acetaminophen, meclizine, senna-docusate  Antibiotics  :    Anti-infectives (From admission, onward)    None        Time Spent in minutes  30   Lala Lund M.D on 11/08/2020 at 11:32 AM  To page go to [email or web address]   [facility] -  Office  [phone]    See all Orders from today for further details    Objective:   Vitals:   11/07/20 1919 11/07/20 2338 11/08/20 0317 11/08/20 0919  BP: 136/68 (!) 160/87 135/75 134/80  Pulse: 77 76 71 84  Resp: '16 16 16 16  '$ Temp: 98.4 F (36.9 C) 98.1 F (36.7 C) 98.5 F (36.9 C) 98.1 F (36.7 C)  TempSrc: Oral Oral Oral Oral  SpO2: 96% 96% 95% 97%  Weight:      Height:        Wt Readings from Last 3 Encounters:  11/06/20 45.5 kg  07/10/19 45.5 kg  07/11/18 43.5 kg     Intake/Output Summary (Last 24 hours) at 11/08/2020 1132 Last data filed at 11/07/2020 1711 Gross per 24 hour  Intake 360 ml  Output --  Net 360 ml     Physical Exam  Awake Alert, No new F.N deficits, continues to have some left-sided weakness arm more than leg [place].AT,PERRAL Supple Neck,No JVD, No cervical lymphadenopathy appriciated.  Symmetrical Chest wall movement, Good air movement bilaterally, CTAB RRR,No Gallops, Rubs or new Murmurs, No Parasternal Heave +ve B.Sounds, Abd Soft, No tenderness, No organomegaly appriciated, No rebound - guarding or rigidity. No Cyanosis, Clubbing or edema, No new Rash or bruise       Data Review:    CBC Recent Labs  Lab 11/05/20 1731 11/08/20 0330  WBC 8.2 5.8  HGB 15.4* 14.8  HCT 45.0 42.9  PLT 213 211  MCV 86.4 85.1  MCH 29.6 29.4  MCHC 34.2 34.5  RDW 12.1 12.2  LYMPHSABS 1.4  1.4  MONOABS 0.6 0.4  EOSABS 0.1 0.3  BASOSABS 0.0 0.0    Recent Labs  Lab 11/05/20 1731 11/06/20 0500 11/07/20 1104 11/08/20 0330  NA 142  --  140 140  K 3.4*  --  4.4 3.8  CL 108  --  107 106  CO2 22  --  24 26  GLUCOSE 125*  --  113* 116*  BUN 15  --  25* 29*  CREATININE 0.67  --  0.83 0.97  CALCIUM 9.5  --  9.5 10.0  AST 23  --   --  28  ALT 20  --   --  26  ALKPHOS 73  --   --  70  BILITOT 0.9  --   --  0.8  ALBUMIN 4.6  --   --  3.4*  MG  --   --  1.8 1.8  INR 1.0  --  1.0  --   HGBA1C  --  6.0*  --   --     ------------------------------------------------------------------------------------------------------------------ Recent Labs    11/06/20 0500  CHOL 235*  HDL 59  LDLCALC 139*  TRIG 186*  CHOLHDL 4.0    Lab Results  Component Value Date   HGBA1C 6.0 (H) 11/06/2020   ------------------------------------------------------------------------------------------------------------------ No results for input(s): TSH, T4TOTAL, T3FREE, THYROIDAB in the last 72 hours.  Invalid input(s): FREET3  Cardiac Enzymes No results for input(s): CKMB, TROPONINI, MYOGLOBIN in the last 168 hours.  Invalid input(s): CK ------------------------------------------------------------------------------------------------------------------ No results found for: BNP   Radiology Reports CT ANGIO HEAD NECK W WO CM  Result Date: 11/05/2020 CLINICAL DATA:  Ataxia, stroke suspected EXAM: CT ANGIOGRAPHY HEAD AND NECK TECHNIQUE: Multidetector CT imaging of the head and neck was performed using the standard protocol during bolus administration of intravenous contrast. Multiplanar CT image reconstructions and MIPs were obtained to evaluate the vascular anatomy. Carotid stenosis measurements (when applicable) are obtained utilizing NASCET criteria, using the distal internal carotid diameter as the denominator. CONTRAST:  20m OMNIPAQUE IOHEXOL 350 MG/ML SOLN COMPARISON:  None. FINDINGS: CT  HEAD FINDINGS Brain: There is no mass, hemorrhage or extra-axial collection. The size and configuration of the ventricles and extra-axial CSF spaces are normal. There is no acute or chronic infarction. The brain parenchyma is normal. Skull: The visualized skull base, calvarium and extracranial soft tissues are normal. Sinuses/Orbits: No fluid levels or advanced mucosal thickening of the visualized paranasal sinuses. No mastoid or middle ear effusion. The orbits are normal. CTA NECK FINDINGS SKELETON: There is no bony  spinal canal stenosis. No lytic or blastic lesion. OTHER NECK: Normal pharynx, larynx and major salivary glands. No cervical lymphadenopathy. Unremarkable thyroid gland. UPPER CHEST: No pneumothorax or pleural effusion. No nodules or masses. AORTIC ARCH: There is calcific atherosclerosis of the aortic arch. There is no aneurysm, dissection or hemodynamically significant stenosis of the visualized portion of the aorta. Conventional 3 vessel aortic branching pattern. The visualized proximal subclavian arteries are widely patent. RIGHT CAROTID SYSTEM: No dissection, occlusion or aneurysm. There is mixed density atherosclerosis extending into the proximal ICA, resulting in 80% stenosis. LEFT CAROTID SYSTEM: Normal without aneurysm, dissection or stenosis. VERTEBRAL ARTERIES: Left dominant configuration. Both origins are clearly patent. There is no dissection, occlusion or flow-limiting stenosis to the skull base (V1-V3 segments). CTA HEAD FINDINGS POSTERIOR CIRCULATION: --Vertebral arteries: Normal V4 segments. --Inferior cerebellar arteries: Normal. --Basilar artery: Normal. --Superior cerebellar arteries: Normal. --Posterior cerebral arteries (PCA): Normal. ANTERIOR CIRCULATION: --Intracranial internal carotid arteries: Normal. --Anterior cerebral arteries (ACA): Normal. Both A1 segments are present. Patent anterior communicating artery (a-comm). --Middle cerebral arteries (MCA): Normal. VENOUS SINUSES:  As permitted by contrast timing, patent. ANATOMIC VARIANTS: None Review of the MIP images confirms the above findings. IMPRESSION: 1. No intracranial arterial occlusion or high-grade stenosis. 2. 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 Aortic Atherosclerosis (ICD10-I70.0). Electronically Signed   By: Ulyses Jarred M.D.   On: 11/05/2020 19:58   DG Chest 2 View  Result Date: 11/05/2020 CLINICAL DATA:  Stroke, left arm weakness EXAM: CHEST - 2 VIEW COMPARISON:  03/24/2015 FINDINGS: The heart size and mediastinal contours are within normal limits. Both lungs are clear. The visualized skeletal structures are unremarkable. IMPRESSION: No active cardiopulmonary disease. Electronically Signed   By: Fidela Salisbury MD   On: 11/05/2020 20:32   MR BRAIN W WO CONTRAST  Result Date: 11/05/2020 CLINICAL DATA:  Essential hypertension.  Headache. EXAM: MRI HEAD WITHOUT AND WITH CONTRAST TECHNIQUE: Multiplanar, multiecho pulse sequences of the brain and surrounding structures were obtained without and with intravenous contrast. CONTRAST:  18m MULTIHANCE GADOBENATE DIMEGLUMINE 529 MG/ML IV SOLN COMPARISON:  06/09/2008 FINDINGS: Brain: There are multiple small regions of diffusion weighted signal abnormality in the right frontal and parietal lobes involving white matter of the centrum semiovale as well as cortex and subcortical white matter towards the vertex compatible with subacute infarcts (MCA and deep watershed territory). There is associated gyriform enhancement most notable in the parietal lobe where there is also a small focus of petechial hemorrhage. Small T2 hyperintensities elsewhere in the cerebral white matter bilaterally are nonspecific but compatible with mild chronic small vessel ischemic disease. There is mild cerebral atrophy. No mass, midline shift, or extra-axial fluid collection is identified. Vascular: Major intracranial vascular flow voids  are preserved. Skull and upper cervical spine: Unremarkable bone marrow signal. Sinuses/Orbits: Bilateral cataract extraction. Paranasal sinuses and mastoid air cells are clear. Other: None. IMPRESSION: 1. Subacute right MCA infarcts. 2. Mild chronic small vessel ischemic disease. Electronically Signed   By: ALogan BoresM.D.   On: 11/05/2020 13:36   [facility] [place] [facility]  Result Date: 11/06/2020 Carotid Arterial Duplex Study Patient Name:  [place] Date of Exam:   11/06/2020 Medical Rec #: [id number]            Accession #:    [id number] of Birth: [birth year]           Patient Gender: F Patient  Age:   83Y Exam Location:  [facility] Procedure:      VAS US CAROTID Referring Phys: [id number] [place] --------------------------------------------------------------------------------  Indications:       CVA. Risk Factors:      None. Comparison Study:  11/05/2020 - CT ANGIO HEAD NECK W WO CM                    IMPRESSION:                    1. No intracranial arterial occlusion or high-grade stenosis.                    2. 80% stenosis of the proximal right internal carotid artery                    secondary to mixed density atherosclerosis. Performing Technologist: Oliver Hum RVT  Examination Guidelines: A complete evaluation includes B-mode imaging, spectral Doppler, color Doppler, and power Doppler as needed of all accessible portions of each vessel. Bilateral testing is considered an integral part of a complete examination. Limited examinations for reoccurring indications may be performed as noted.  Right Carotid Findings: +----------+--------+--------+--------+--------------------------+--------+           PSV cm/sEDV cm/sStenosisPlaque Description        Comments +----------+--------+--------+--------+--------------------------+--------+ CCA Prox  98      16              smooth and heterogenous             +----------+--------+--------+--------+--------------------------+--------+ CCA Distal39      10              smooth and heterogenous            +----------+--------+--------+--------+--------------------------+--------+ ICA Prox  374     162     80-99%  irregular and heterogenous         +----------+--------+--------+--------+--------------------------+--------+ ICA Mid   110     28              smooth and heterogenous            +----------+--------+--------+--------+--------------------------+--------+ ICA Distal62      22                                        tortuous +----------+--------+--------+--------+--------------------------+--------+ ECA       116     9                                                  +----------+--------+--------+--------+--------------------------+--------+ +----------+--------+-------+--------+-------------------+           PSV cm/sEDV cmsDescribeArm Pressure (mmHG) +----------+--------+-------+--------+-------------------+ [id number]                                         +----------+--------+-------+--------+-------------------+ +---------+--------+--+--------+--+---------+ VertebralPSV cm/s40EDV cm/s12Antegrade +---------+--------+--+--------+--+---------+  Left Carotid Findings: +----------+--------+--------+--------+-----------------------+--------+           PSV cm/sEDV cm/sStenosisPlaque Description     Comments +----------+--------+--------+--------+-----------------------+--------+ CCA Prox  70      14              smooth and heterogenous         +----------+--------+--------+--------+-----------------------+--------+  CCA Distal47      14              smooth and heterogenous         +----------+--------+--------+--------+-----------------------+--------+ ICA Prox  49      17              smooth and heterogenous         +----------+--------+--------+--------+-----------------------+--------+  ICA Distal68      24                                     tortuous +----------+--------+--------+--------+-----------------------+--------+ ECA       60      6                                               +----------+--------+--------+--------+-----------------------+--------+ +----------+--------+--------+--------+-------------------+           PSV cm/sEDV cm/sDescribeArm Pressure (mmHG) +----------+--------+--------+--------+-------------------+ [id number]                                         +----------+--------+--------+--------+-------------------+ +---------+--------+--+--------+--+---------+ VertebralPSV cm/s40EDV cm/s13Antegrade +---------+--------+--+--------+--+---------+   Summary: Right Carotid: Velocities in the right ICA are consistent with a 80-99%                stenosis. Left Carotid: Velocities in the left ICA are consistent with a 1-39% stenosis. Vertebrals: Bilateral vertebral arteries demonstrate antegrade flow. *See table(s) above for measurements and observations.  Electronically signed by Antony Contras MD on 11/06/2020 at 1:00:26 PM.    Final

## 2020-11-08 NOTE — Plan of Care (Signed)
  Problem: Education: Goal: Knowledge of disease or condition will improve Outcome: Progressing Goal: Knowledge of secondary prevention will improve Outcome: Progressing Goal: Knowledge of patient specific risk factors addressed and post discharge goals established will improve Outcome: Progressing   Problem: Coping: Goal: Will identify appropriate support needs Outcome: Progressing   Problem: Self-Care: Goal: Ability to participate in self-care as condition permits will improve Outcome: Progressing Goal: Verbalization of feelings and concerns over difficulty with self-care will improve Outcome: Progressing

## 2020-11-09 ENCOUNTER — Inpatient Hospital Stay (HOSPITAL_COMMUNITY): Payer: Medicare Other

## 2020-11-09 DIAGNOSIS — I63231 Cerebral infarction due to unspecified occlusion or stenosis of right carotid arteries: Secondary | ICD-10-CM | POA: Diagnosis not present

## 2020-11-09 DIAGNOSIS — E7800 Pure hypercholesterolemia, unspecified: Secondary | ICD-10-CM | POA: Diagnosis not present

## 2020-11-09 DIAGNOSIS — E43 Unspecified severe protein-calorie malnutrition: Secondary | ICD-10-CM | POA: Insufficient documentation

## 2020-11-09 DIAGNOSIS — I639 Cerebral infarction, unspecified: Secondary | ICD-10-CM | POA: Diagnosis not present

## 2020-11-09 DIAGNOSIS — I63511 Cerebral infarction due to unspecified occlusion or stenosis of right middle cerebral artery: Secondary | ICD-10-CM | POA: Diagnosis not present

## 2020-11-09 DIAGNOSIS — I6521 Occlusion and stenosis of right carotid artery: Secondary | ICD-10-CM | POA: Diagnosis not present

## 2020-11-09 LAB — CBC WITH DIFFERENTIAL/PLATELET
Abs Immature Granulocytes: 0.02 10*3/uL (ref 0.00–0.07)
Basophils Absolute: 0 10*3/uL (ref 0.0–0.1)
Basophils Relative: 0 %
Eosinophils Absolute: 0.3 10*3/uL (ref 0.0–0.5)
Eosinophils Relative: 5 %
HCT: 41.3 % (ref 36.0–46.0)
Hemoglobin: 14.1 g/dL (ref 12.0–15.0)
Immature Granulocytes: 0 %
Lymphocytes Relative: 23 %
Lymphs Abs: 1.3 10*3/uL (ref 0.7–4.0)
MCH: 29.4 pg (ref 26.0–34.0)
MCHC: 34.1 g/dL (ref 30.0–36.0)
MCV: 86 fL (ref 80.0–100.0)
Monocytes Absolute: 0.5 10*3/uL (ref 0.1–1.0)
Monocytes Relative: 9 %
Neutro Abs: 3.4 10*3/uL (ref 1.7–7.7)
Neutrophils Relative %: 63 %
Platelets: 177 10*3/uL (ref 150–400)
RBC: 4.8 MIL/uL (ref 3.87–5.11)
RDW: 12.1 % (ref 11.5–15.5)
WBC: 5.4 10*3/uL (ref 4.0–10.5)
nRBC: 0 % (ref 0.0–0.2)

## 2020-11-09 LAB — COMPREHENSIVE METABOLIC PANEL
ALT: 48 U/L — ABNORMAL HIGH (ref 0–44)
AST: 65 U/L — ABNORMAL HIGH (ref 15–41)
Albumin: 3.2 g/dL — ABNORMAL LOW (ref 3.5–5.0)
Alkaline Phosphatase: 69 U/L (ref 38–126)
Anion gap: 9 (ref 5–15)
BUN: 27 mg/dL — ABNORMAL HIGH (ref 8–23)
CO2: 25 mmol/L (ref 22–32)
Calcium: 9.4 mg/dL (ref 8.9–10.3)
Chloride: 107 mmol/L (ref 98–111)
Creatinine, Ser: 0.81 mg/dL (ref 0.44–1.00)
GFR, Estimated: >60 mL/min (ref >60)
Glucose, Bld: 133 mg/dL — ABNORMAL HIGH (ref 70–99)
Potassium: 4 mmol/L (ref 3.5–5.1)
Sodium: 141 mmol/L (ref 135–145)
Total Bilirubin: 0.8 mg/dL (ref 0.3–1.2)
Total Protein: 5.7 g/dL — ABNORMAL LOW (ref 6.5–8.1)

## 2020-11-09 LAB — MAGNESIUM: Magnesium: 1.8 mg/dL (ref 1.7–2.4)

## 2020-11-09 MED ORDER — LACTATED RINGERS IV SOLN: INTRAVENOUS | Status: DC

## 2020-11-09 NOTE — Progress Notes (Signed)
STROKE TEAM PROGRESS NOTE   INTERVAL HISTORY No family is at the bedside.  Pt still complaining of left arm weakness and numbness, however, per Dr. Candiss Norse, she stated worsening left arm weakness and numbness this morning.  Repeat limited MRI did not show new infarct.  Planning to have vascular surgery for right ICA stenosis soon.   OBJECTIVE Vitals:   11/08/20 2045 11/08/20 2329 11/09/20 0405 11/09/20 0713  BP: (!) 146/73 (!) 157/85 (!) 144/69 (!) 146/76  Pulse: 78 72 72 79  Resp: '18 16 18 18  '$ Temp: 97.7 F (36.5 C) 98.6 F (37 C) 97.8 F (36.6 C) 97.7 F (36.5 C)  TempSrc: Oral  Oral Oral  SpO2: 98% 97% 97% 98%  Weight:      Height:        CBC:  Recent Labs  Lab 11/08/20 0330 11/09/20 0136  WBC 5.8 5.4  NEUTROABS 3.7 3.4  HGB 14.8 14.1  HCT 42.9 41.3  MCV 85.1 86.0  PLT 211 [id number]    Basic Metabolic Panel:  Recent Labs  Lab 11/08/20 0330 11/09/20 0136  NA 140 141  K 3.8 4.0  CL 106 107  CO2 26 25  GLUCOSE 116* 133*  BUN 29* 27*  CREATININE 0.97 0.81  CALCIUM 10.0 9.4  MG 1.8 1.8    Lipid Panel:     Component Value Date/Time   CHOL 235 (H) 11/06/2020 0500   TRIG 186 (H) 11/06/2020 0500   HDL 59 11/06/2020 0500   CHOLHDL 4.0 11/06/2020 0500   VLDL 37 11/06/2020 0500   LDLCALC 139 (H) 11/06/2020 0500   HgbA1c:  Lab Results  Component Value Date   HGBA1C 6.0 (H) 11/06/2020   Urine Drug Screen:     Component Value Date/Time   LABOPIA NONE DETECTED 11/05/2020 1930   COCAINSCRNUR NONE DETECTED 11/05/2020 1930   LABBENZ NONE DETECTED 11/05/2020 1930   AMPHETMU NONE DETECTED 11/05/2020 1930   THCU NONE DETECTED 11/05/2020 1930   LABBARB NONE DETECTED 11/05/2020 1930    Alcohol Level No results found for: [place]  MRI Brain  11/09/20 No acute or progressive infarct.  No interval hemorrhage.  MRI Brain WO Contrast 11/05/20 Subacute right MCA infarcts. 2. Mild chronic small vessel ischemic disease.  CTA Head and  Neck 11/05/20 IMPRESSION: 1. No intracranial arterial occlusion or high-grade stenosis. 2. 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 Aortic Atherosclerosis (ICD10-I70.0).  ECHOCARDIOGRAM COMPLETE  Result Date: 11/08/2020    ECHOCARDIOGRAM REPORT   Patient Name:   [name] [facility] Date of Exam: 11/08/2020 Medical Rec #:  [id number]            Height:       59.5 in Accession #:    [id number]           Weight:       100.3 lb Date of Birth:  08-Feb-1946           BSA:          1.384 m Patient Age:    75 years             BP:           134/80 mmHg Patient Gender: F                    HR:           84 bpm. Exam Location:  Inpatient Procedure: 2D Echo, Cardiac  Doppler and Color Doppler Indications:    Stroke  History:        Patient has prior history of Echocardiogram examinations, most                 recent 04/07/2018. Risk Factors:Hypertension and Dyslipidemia.                 GERD.  Sonographer:    Clayton Lefort RDCS (AE) Referring Phys: [id number] ALEXIS HUGELMEYER  Sonographer Comments: Technically difficult study due to poor echo windows. IMPRESSIONS  1. Left ventricular ejection fraction, by estimation, is 60 to 65%. The left ventricle has normal function. The left ventricle has no regional wall motion abnormalities. Left ventricular diastolic parameters were normal.  2. Right ventricular systolic function was not well visualized. The right ventricular size is normal. There is normal pulmonary artery systolic pressure. The estimated right ventricular systolic pressure is 99991111 mmHg.  3. The mitral valve is normal in structure. No evidence of mitral valve regurgitation. No evidence of mitral stenosis.  4. The aortic valve is tricuspid. Aortic valve regurgitation is trivial. Mild to moderate aortic valve sclerosis/calcification is present, without any evidence of aortic stenosis.  5. The inferior vena cava is normal in size with greater than 50%  respiratory variability, suggesting right atrial pressure of 3 mmHg. FINDINGS  Left Ventricle: Left ventricular ejection fraction, by estimation, is 60 to 65%. The left ventricle has normal function. The left ventricle has no regional wall motion abnormalities. The left ventricular internal cavity size was normal in size. There is  no left ventricular hypertrophy. Left ventricular diastolic parameters were normal. Normal left ventricular filling pressure. Right Ventricle: The right ventricular size is normal. No increase in right ventricular wall thickness. Right ventricular systolic function was not well visualized. There is normal pulmonary artery systolic pressure. The tricuspid regurgitant velocity is  2.63 m/s, and with an assumed right atrial pressure of 3 mmHg, the estimated right ventricular systolic pressure is 99991111 mmHg. Left Atrium: Left atrial size was normal in size. Right Atrium: Right atrial size was normal in size. Pericardium: There is no evidence of pericardial effusion. Presence of pericardial fat pad. Mitral Valve: The mitral valve is normal in structure. No evidence of mitral valve regurgitation. No evidence of mitral valve stenosis. Tricuspid Valve: The tricuspid valve is normal in structure. Tricuspid valve regurgitation is mild . No evidence of tricuspid stenosis. Aortic Valve: The aortic valve is tricuspid. Aortic valve regurgitation is trivial. Mild to moderate aortic valve sclerosis/calcification is present, without any evidence of aortic stenosis. Aortic valve mean gradient measures 2.0 mmHg. Aortic valve peak  gradient measures 3.7 mmHg. Aortic valve area, by VTI measures 1.82 cm. Pulmonic Valve: The pulmonic valve was normal in structure. Pulmonic valve regurgitation is not visualized. No evidence of pulmonic stenosis. Aorta: The aortic root is normal in size and structure. Venous: The inferior vena cava is normal in size with greater than 50% respiratory variability, suggesting right  atrial pressure of 3 mmHg. IAS/Shunts: There is redundancy of the interatrial septum. The interatrial septum appears to be lipomatous. No atrial level shunt detected by color flow Doppler.  LEFT VENTRICLE PLAX 2D LVIDd:         3.00 cm  Diastology LVIDs:         1.90 cm  LV e' medial:    9.03 cm/s LV PW:         1.20 cm  LV E/e' medial:  8.9 LV IVS:  1.10 cm  LV e' lateral:   9.36 cm/s LVOT diam:     1.90 cm  LV E/e' lateral: 8.6 LV SV:         31 LV SV Index:   23 LVOT Area:     2.84 cm  RIGHT VENTRICLE             IVC RV S prime:     13.60 cm/s  IVC diam: 1.20 cm TAPSE (M-mode): 1.3 cm LEFT ATRIUM           Index       RIGHT ATRIUM          Index LA diam:      1.70 cm 1.23 cm/m  RA Area:     8.88 cm LA Vol (A4C): 16.6 ml 12.00 ml/m RA Volume:   18.50 ml 13.37 ml/m  AORTIC VALVE AV Area (Vmax):    1.89 cm AV Area (Vmean):   1.90 cm AV Area (VTI):     1.82 cm AV Vmax:           95.80 cm/s AV Vmean:          69.300 cm/s AV VTI:            0.173 m AV Peak Grad:      3.7 mmHg AV Mean Grad:      2.0 mmHg LVOT Vmax:         63.80 cm/s LVOT Vmean:        46.400 cm/s LVOT VTI:          0.111 m LVOT/AV VTI ratio: 0.64  AORTA Ao Root diam: 3.00 cm Ao Asc diam:  2.80 cm MITRAL VALVE               TRICUSPID VALVE MV Area (PHT): 7.66 cm    TR Peak grad:   27.7 mmHg MV Decel Time: 99 msec     TR Vmax:        263.00 cm/s MV E velocity: 80.70 cm/s MV A velocity: 88.00 cm/s  SHUNTS MV E/A ratio:  0.92        Systemic VTI:  0.11 m                            Systemic Diam: 1.90 cm Fransico Him MD Electronically signed by Fransico Him MD Signature Date/Time: 11/08/2020/11:36:13 AM    Final       Bilateral Carotid Dopplers  11/06/20 IMPRESSION:                     1. No intracranial arterial occlusion or high-grade stenosis.                     2. 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   ECG - SR rate 86 BPM. (See cardiology reading for complete  details)    PHYSICAL EXAM  Temp:  [97.7 F (36.5 C)-98.6 F (37 C)] 97.9 F (36.6 C) (07/24 1518) Pulse Rate:  [72-92] 92 (07/24 1518) Resp:  [15-20] 20 (07/24 1518) BP: (121-163)/(68-145) 163/145 (07/24 1518) SpO2:  [97 %-99 %] 99 % (07/24 1518)  General - Well nourished, well developed, in no apparent distress.  Ophthalmologic - fundi not visualized due to noncooperation.  Cardiovascular - Regular rhythm and rate.  Mental Status -  Level of arousal and orientation to time, place, and person  were intact. Language including expression, naming, repetition, comprehension was assessed and found intact.   Cranial Nerves II - XII - II - Visual field intact OU. III, IV, VI - Extraocular movements intact. V - Facial sensation intact bilaterally. VII - Facial movement intact bilaterally. VIII - Hearing & vestibular intact bilaterally. X - Palate elevates symmetrically. XI - Chin turning & shoulder shrug intact bilaterally. XII - Tongue protrusion intact.  Motor Strength - The patient's strength was normal in all extremities except left upper extremity 3/5 proximal and 4+/5 distal.  Bulk was normal and fasciculations were absent.   Motor Tone - Muscle tone was assessed at the neck and appendages and was normal.  Reflexes - The patient's reflexes were symmetrical in all extremities and she had no pathological reflexes.  Sensory - Light touch, temperature/pinprick were assessed and showed left upper extremity decreased light touch sensation  Coordination - The patient had normal movements in the hands with no ataxia or dysmetria.  Tremor was absent.  Gait and Station - deferred.    ASSESSMENT/PLAN [name] is a 75 y.o. female with history of hypertension, GERD, and vertigo who presented to [facility] for evaluation after an outpatient MRI brain, ordered by her PCP, identified multiple acute strokes. The MRI was ordered for evaluation of a one month hx of bitemporal  headaches and left upper extremity weakness / numbness associated with right eye blurry vision in the inferior visual field. She did not receive IV t-PA due to late presentation (>4.5 hours from time of onset).   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 MRI Brain 11/05/20 - Subacute right MCA infarcts. Mild chronic small vessel ischemic disease. MRI Brain 11/09/20 - no new infarct Carotid Doppler - right ICA stenosis 80-99%. 2D Echo - EF 60 - 65%. No cardiac source of emboli identified.  LDL - 139 HgbA1c - 6.0 UDS - negative VTE prophylaxis - Lovenox No antithrombotic prior to admission, now on aspirin 81 mg daily and clopidogrel 75 mg daily DAPT. Further regimen per vascular surgery Patient will be counseled to be compliant with her antithrombotic medications Ongoing aggressive stroke risk factor management Therapy recommendations:  HH PT/OT Disposition:  Pending  Hypertension Home BP meds: Norvasc Stable Avoid low BP BP goal before carotid revascularization 130-160 Long-term BP goal normotensive post carotid revascularization  Hyperlipidemia Home Lipid lowering medication: none  LDL 139, goal < 70 Current lipid lowering medication: Lipitor 80 mg daily  Continue statin at discharge  Other Stroke Risk Factors Advanced age Former cigarette smoker - quit 17 yrs ago.  Other Active Problems, Findings, Recommendations and/or Plan Code status - Full code Aortic Atherosclerosis (ICD10-I70.0)  Hospital day # 4  I had long discussion with daughter over the phone, updated pt current condition, treatment plan and potential prognosis, and answered all the questions.  She expressed understanding and appreciation.   Neurology will sign off. Please call with  questions. Pt will follow up with stroke clinic NP at [facility] in about 4 weeks. Thanks for the consult.  Rosalin Hawking, MD PhD Stroke Neurology 11/09/2020 3:50 PM    To contact Stroke Continuity provider, please refer to [email or web address] After hours, contact General Neurology

## 2020-11-09 NOTE — Progress Notes (Addendum)
Seen this morning on rounds.  The patient is doing well.  No issues per bedside nurse.  There is no Oceanographer available.  No family at bedside.  Peers that her neurologic baseline.  She is talking in [place] on her cell phone using her left hand.  Unfortunately due to OR availability, there is no time for me or any of my partners to do her surgery tomorrow.  I have her scheduled for November 17, 2020 at 0730 for right TCAR.  It is critical she continues aspirin, Plavix, and high intensity statin. If she deteriorates neurologically, we will do her revascularization urgently. I spoke to her daughter on the telephone and she is understanding.    Dawn Aline. Stanford Breed, MD [facility] Phone Number: [phone] 11/09/2020 9:27 AM

## 2020-11-09 NOTE — Progress Notes (Signed)
SLP Cancellation Note  Patient Details Name: [name] MRN: [id number] DOB: [birth year]   Cancelled treatment:       Reason Eval/Treat Not Completed: AMN interpreter on floor was broken and no family was present at this time to help with translation.  Additionally, pt left for MRI following SLP arrival. SLP will f/u as schedule allows.    Elvia Collum Geralda Baumgardner 11/09/2020, 10:15 AM

## 2020-11-09 NOTE — Plan of Care (Signed)
  Problem: Education: Goal: Knowledge of disease or condition will improve Outcome: Progressing Goal: Knowledge of secondary prevention will improve Outcome: Progressing Goal: Knowledge of patient specific risk factors addressed and post discharge goals established will improve Outcome: Progressing   Problem: Coping: Goal: Will identify appropriate support needs Outcome: Progressing   Problem: Self-Care: Goal: Ability to participate in self-care as condition permits will improve Outcome: Progressing Goal: Verbalization of feelings and concerns over difficulty with self-care will improve Outcome: Progressing   Problem: Education: Goal: Knowledge of General Education information will improve Description: Including pain rating scale, medication(s)/side effects and non-pharmacologic comfort measures Outcome: Progressing   Problem: Health Behavior/Discharge Planning: Goal: Ability to manage health-related needs will improve Outcome: Progressing   Problem: Clinical Measurements: Goal: Ability to maintain clinical measurements within normal limits will improve Outcome: Progressing Goal: Will remain free from infection Outcome: Progressing Goal: Diagnostic test results will improve Outcome: Progressing Goal: Respiratory complications will improve Outcome: Progressing Goal: Cardiovascular complication will be avoided Outcome: Progressing   Problem: Activity: Goal: Risk for activity intolerance will decrease Outcome: Progressing   Problem: Nutrition: Goal: Adequate nutrition will be maintained Outcome: Progressing   Problem: Coping: Goal: Level of anxiety will decrease Outcome: Progressing   Problem: Elimination: Goal: Will not experience complications related to bowel motility Outcome: Progressing Goal: Will not experience complications related to urinary retention Outcome: Progressing   Problem: Pain Managment: Goal: General experience of comfort will  improve Outcome: Progressing   Problem: Safety: Goal: Ability to remain free from injury will improve Outcome: Progressing   Problem: Skin Integrity: Goal: Risk for impaired skin integrity will decrease Outcome: Progressing

## 2020-11-09 NOTE — Progress Notes (Addendum)
PROGRESS NOTE                                                                                                                                                                                                             Patient Demographics:    [name], is a 75 y.o. female, DOB - [birth year], [id number]  Outpatient Primary MD for the patient is Lavone Orn, MD    LOS - 4  Admit date - 11/05/2020    Chief Complaint  Patient presents with   Abnormal Lab       Brief Narrative (HPI from H&P) - [name] is a 75 y.o. female with a known history of hypertension, GERD, vertigo was in a usual state of health until 1 month ago when she developed sudden onset of headache associated with left arm weakness and bilateral lower extremity weakness, work-up showed right MCA CVA.   Subjective:   Patient in bed, appears comfortable, denies any headache, no fever, no chest pain or pressure, no shortness of breath , no abdominal pain. +ve mild L. weakness.   Assessment  & Plan :     Left-sided weakness arm more than leg due to acute Right MCA stroke.  With evidence of right ICA significant stenosis greater than 80%, stroke team following she is on dual antiplatelet therapy and high intensity statin will stroke work-up underway, LDL was above goal, A1c was stable, able echocardiogram with preserved EF of 65%.  DW VVS and  CVA team Dr Leonie Man 11/07/2020, will try to keep systolic blood pressure untreated around 150.  He is discussed with stroke team Dr. Erlinda Hong again on 11/09/2020 as symptoms got slightly worse in her left arm, repeat MRI on 11/09/2020 unchanged.  Will give IV fluids for 12 hours to keep her systolic blood pressure close to 150.  Also communicated with vascular surgery team to see if we can expedite surgery.   Lab Results  Component Value Date   HGBA1C 6.0 (H) 11/06/2020    Lab Results  Component Value Date   CHOL 235  (H) 11/06/2020   HDL 59 11/06/2020   LDLCALC 139 (H) 11/06/2020  TRIG 186 (H) 11/06/2020   CHOLHDL 4.0 11/06/2020    2.  Dyslipidemia.  Placed on high intensity statin.  3.  R. ICA significant stenosis with right MCA stroke.  On dual antiplatelet therapy and statin, vascular surgery consulted for possible surgery.   4. HTN - permissive due to Acute CVA, trying to keep systolic around [id number] as she has significant critical stenosis in right ICA with waxing waning left-sided weakness.  5.  Mild asymptomatic transaminitis.  Likely due to statin.  Will monitor.      Condition - Extremely Guarded  Family Communication  :  grand son bedside 11/07/20  Code Status :  Full  Consults  :  Neuro, VVS  PUD Prophylaxis : PPI   Procedures  :     MRI - 1. Subacute right MCA infarcts. 2. Mild chronic small vessel ischemic disease  CTA - 1. No intracranial arterial occlusion or high-grade stenosis. 2. 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 Aortic Atherosclerosis (ICD10-I70.0).   Carotid US - 1. No intracranial arterial occlusion or high-grade stenosis. 2. 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 Aortic Atherosclerosis.  TTE -  1. Left ventricular ejection fraction, by estimation, is 60 to 65%. The left ventricle has normal function. The left ventricle has no regional wall motion abnormalities. Left ventricular diastolic parameters were normal.  2. Right ventricular systolic function was not well visualized. The right ventricular size is normal. There is normal pulmonary artery systolic pressure. The estimated right ventricular systolic pressure is 99991111 mmHg.  3. The mitral valve is normal in structure. No evidence of mitral valve regurgitation. No evidence of mitral stenosis.  4. The aortic valve is tricuspid. Aortic valve regurgitation is trivial. Mild to moderate aortic valve  sclerosis/calcification is present, without any evidence of aortic stenosis.  5. The inferior vena cava is normal in size with greater than 50% respiratory variability, suggesting right atrial pressure of 3 mmHg.      Disposition Plan  :    Status is: Inpatient  Remains inpatient appropriate because:IV treatments appropriate due to intensity of illness or inability to take PO  Dispo: The patient is from: Home              Anticipated d/c is to: Home              Patient currently is not medically stable to d/c.   Difficult to place patient No  DVT Prophylaxis  :    enoxaparin (LOVENOX) injection 30 mg Start: 11/05/20 2200 SCD's Start: 11/05/20 1955     Lab Results  Component Value Date   PLT 177 11/09/2020    Diet :  Diet Order             Diet regular Room service appropriate? Yes; Fluid consistency: Thin  Diet effective now                    Inpatient Medications  Scheduled Meds:   stroke: mapping our early stages of recovery book   Does not apply Once   aspirin EC  81 mg Oral Daily   atorvastatin  80 mg Oral Daily   clopidogrel  75 mg Oral Daily   enoxaparin (LOVENOX) injection  30 mg Subcutaneous Q24H   feeding supplement  237 mL Oral BID BM   multivitamin with minerals  1 tablet Oral Daily   pantoprazole  40 mg  Oral q AM   Continuous Infusions:  lactated ringers 100 mL/hr at 11/09/20 0931   PRN Meds:.[DISCONTINUED] acetaminophen **OR** [DISCONTINUED] acetaminophen (TYLENOL) oral liquid 160 mg/5 mL **OR** acetaminophen, acetaminophen, meclizine, senna-docusate  Antibiotics  :    Anti-infectives (From admission, onward)    None        Time Spent in minutes  30   Lala Lund M.D on 11/09/2020 at 11:42 AM  To page go to [email or web address]   [facility] -  Office  [phone]    See all Orders from today for further details    Objective:   Vitals:   11/08/20 2329 11/09/20 0405 11/09/20 0713 11/09/20 1129  BP: (!) 157/85 (!)  144/69 (!) 146/76 (!) 144/68  Pulse: 72 72 79 90  Resp: '16 18 18 18  '$ Temp: 98.6 F (37 C) 97.8 F (36.6 C) 97.7 F (36.5 C) 97.9 F (36.6 C)  TempSrc:  Oral Oral Oral  SpO2: 97% 97% 98% 98%  Weight:      Height:        Wt Readings from Last 3 Encounters:  11/06/20 45.5 kg  07/10/19 45.5 kg  07/11/18 43.5 kg    No intake or output data in the 24 hours ending 11/09/20 1142    Physical Exam  Awake Alert, No new F.N deficits, continues to have some left-sided weakness arm more than leg, arm 3-4/5 [place].AT,PERRAL Supple Neck,No JVD, No cervical lymphadenopathy appriciated.  Symmetrical Chest wall movement, Good air movement bilaterally, CTAB RRR,No Gallops, Rubs or new Murmurs, No Parasternal Heave +ve B.Sounds, Abd Soft, No tenderness, No organomegaly appriciated, No rebound - guarding or rigidity. No Cyanosis, Clubbing or edema, No new Rash or bruise       Data Review:    CBC Recent Labs  Lab 11/05/20 1731 11/08/20 0330 11/09/20 0136  WBC 8.2 5.8 5.4  HGB 15.4* 14.8 14.1  HCT 45.0 42.9 41.3  PLT 213 211 177  MCV 86.4 85.1 86.0  MCH 29.6 29.4 29.4  MCHC 34.2 34.5 34.1  RDW 12.1 12.2 12.1  LYMPHSABS 1.4 1.4 1.3  MONOABS 0.6 0.4 0.5  EOSABS 0.1 0.3 0.3  BASOSABS 0.0 0.0 0.0    Recent Labs  Lab 11/05/20 1731 11/06/20 0500 11/07/20 1104 11/08/20 0330 11/09/20 0136  NA 142  --  140 140 141  K 3.4*  --  4.4 3.8 4.0  CL 108  --  107 106 107  CO2 22  --  '24 26 25  '$ GLUCOSE 125*  --  113* 116* 133*  BUN 15  --  25* 29* 27*  CREATININE 0.67  --  0.83 0.97 0.81  CALCIUM 9.5  --  9.5 10.0 9.4  AST 23  --   --  28 65*  ALT 20  --   --  26 48*  ALKPHOS 73  --   --  70 69  BILITOT 0.9  --   --  0.8 0.8  ALBUMIN 4.6  --   --  3.4* 3.2*  MG  --   --  1.8 1.8 1.8  INR 1.0  --  1.0  --   --   HGBA1C  --  6.0*  --   --   --     ------------------------------------------------------------------------------------------------------------------ No results for  input(s): CHOL, HDL, LDLCALC, TRIG, CHOLHDL, LDLDIRECT in the last 72 hours.   Lab Results  Component Value Date   HGBA1C 6.0 (H) 11/06/2020   ------------------------------------------------------------------------------------------------------------------ No results for input(s): TSH, T4TOTAL,  T3FREE, THYROIDAB in the last 72 hours.  Invalid input(s): FREET3  Cardiac Enzymes No results for input(s): CKMB, TROPONINI, MYOGLOBIN in the last 168 hours.  Invalid input(s): CK ------------------------------------------------------------------------------------------------------------------ No results found for: BNP   Radiology Reports CT ANGIO HEAD NECK W WO CM  Result Date: 11/05/2020 CLINICAL DATA:  Ataxia, stroke suspected EXAM: CT ANGIOGRAPHY HEAD AND NECK TECHNIQUE: Multidetector CT imaging of the head and neck was performed using the standard protocol during bolus administration of intravenous contrast. Multiplanar CT image reconstructions and MIPs were obtained to evaluate the vascular anatomy. Carotid stenosis measurements (when applicable) are obtained utilizing NASCET criteria, using the distal internal carotid diameter as the denominator. CONTRAST:  61m OMNIPAQUE IOHEXOL 350 MG/ML SOLN COMPARISON:  None. FINDINGS: CT HEAD FINDINGS Brain: There is no mass, hemorrhage or extra-axial collection. The size and configuration of the ventricles and extra-axial CSF spaces are normal. There is no acute or chronic infarction. The brain parenchyma is normal. Skull: The visualized skull base, calvarium and extracranial soft tissues are normal. Sinuses/Orbits: No fluid levels or advanced mucosal thickening of the visualized paranasal sinuses. No mastoid or middle ear effusion. The orbits are normal. CTA NECK FINDINGS SKELETON: There is no bony spinal canal stenosis. No lytic or blastic lesion. OTHER NECK: Normal pharynx, larynx and major salivary glands. No cervical lymphadenopathy. Unremarkable  thyroid gland. UPPER CHEST: No pneumothorax or pleural effusion. No nodules or masses. AORTIC ARCH: There is calcific atherosclerosis of the aortic arch. There is no aneurysm, dissection or hemodynamically significant stenosis of the visualized portion of the aorta. Conventional 3 vessel aortic branching pattern. The visualized proximal subclavian arteries are widely patent. RIGHT CAROTID SYSTEM: No dissection, occlusion or aneurysm. There is mixed density atherosclerosis extending into the proximal ICA, resulting in 80% stenosis. LEFT CAROTID SYSTEM: Normal without aneurysm, dissection or stenosis. VERTEBRAL ARTERIES: Left dominant configuration. Both origins are clearly patent. There is no dissection, occlusion or flow-limiting stenosis to the skull base (V1-V3 segments). CTA HEAD FINDINGS POSTERIOR CIRCULATION: --Vertebral arteries: Normal V4 segments. --Inferior cerebellar arteries: Normal. --Basilar artery: Normal. --Superior cerebellar arteries: Normal. --Posterior cerebral arteries (PCA): Normal. ANTERIOR CIRCULATION: --Intracranial internal carotid arteries: Normal. --Anterior cerebral arteries (ACA): Normal. Both A1 segments are present. Patent anterior communicating artery (a-comm). --Middle cerebral arteries (MCA): Normal. VENOUS SINUSES: As permitted by contrast timing, patent. ANATOMIC VARIANTS: None Review of the MIP images confirms the above findings. IMPRESSION: 1. No intracranial arterial occlusion or high-grade stenosis. 2. 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 Aortic Atherosclerosis (ICD10-I70.0). Electronically Signed   By: KUlyses JarredM.D.   On: 11/05/2020 19:58   DG Chest 2 View  Result Date: 11/05/2020 CLINICAL DATA:  Stroke, left arm weakness EXAM: CHEST - 2 VIEW COMPARISON:  03/24/2015 FINDINGS: The heart size and mediastinal contours are within normal limits. Both lungs are clear. The visualized skeletal structures are  unremarkable. IMPRESSION: No active cardiopulmonary disease. Electronically Signed   By: AFidela SalisburyMD   On: 11/05/2020 20:32   MR BRAIN WO CONTRAST  Result Date: 11/09/2020 CLINICAL DATA:  Stroke follow-up. EXAM: MRI HEAD WITHOUT CONTRAST TECHNIQUE: Multiplanar, multiecho pulse sequences of the brain and surrounding structures were obtained without intravenous contrast. COMPARISON:  11/05/2020 FINDINGS: Brain: Limited sequence exam per provider request, only diffusion and gradient imaging. Fading patchy restricted diffusion in the high right cerebral white matter and frontal parietal cortex. No acute hemorrhage or acute infarct. No hydrocephalus or collection. Vascular: No T2 weighted imaging  Skull and upper cervical spine: Unremarkable Sinuses/Orbits: Unremarkable IMPRESSION: No acute or progressive infarct.  No interval hemorrhage. Electronically Signed   By: Monte Fantasia M.D.   On: 11/09/2020 10:57   MR BRAIN W WO CONTRAST  Result Date: 11/05/2020 CLINICAL DATA:  Essential hypertension.  Headache. EXAM: MRI HEAD WITHOUT AND WITH CONTRAST TECHNIQUE: Multiplanar, multiecho pulse sequences of the brain and surrounding structures were obtained without and with intravenous contrast. CONTRAST:  58m MULTIHANCE GADOBENATE DIMEGLUMINE 529 MG/ML IV SOLN COMPARISON:  06/09/2008 FINDINGS: Brain: There are multiple small regions of diffusion weighted signal abnormality in the right frontal and parietal lobes involving white matter of the centrum semiovale as well as cortex and subcortical white matter towards the vertex compatible with subacute infarcts (MCA and deep watershed territory). There is associated gyriform enhancement most notable in the parietal lobe where there is also a small focus of petechial hemorrhage. Small T2 hyperintensities elsewhere in the cerebral white matter bilaterally are nonspecific but compatible with mild chronic small vessel ischemic disease. There is mild cerebral atrophy. No  mass, midline shift, or extra-axial fluid collection is identified. Vascular: Major intracranial vascular flow voids are preserved. Skull and upper cervical spine: Unremarkable bone marrow signal. Sinuses/Orbits: Bilateral cataract extraction. Paranasal sinuses and mastoid air cells are clear. Other: None. IMPRESSION: 1. Subacute right MCA infarcts. 2. Mild chronic small vessel ischemic disease. Electronically Signed   By: ALogan BoresM.D.   On: 11/05/2020 13:36   ECHOCARDIOGRAM COMPLETE  Result Date: 11/08/2020    ECHOCARDIOGRAM REPORT   Patient Name:   KHIBAH GIACONATGila [facility] Health Care CorporationDGlen CampbellDate of Exam: 11/08/2020 Medical Rec #:  [id number]           Height:       59.5 in Accession #:    [id number]          Weight:       100.3 lb Date of Birth:  107/31/1947          BSA:          1.384 m Patient Age:    89years             BP:           134/80 mmHg Patient Gender: F                    HR:           84 bpm. Exam Location:  Inpatient Procedure: 2D Echo, Cardiac Doppler and Color Doppler Indications:    Stroke  History:        Patient has prior history of Echocardiogram examinations, most                 recent 04/07/2018. Risk Factors:Hypertension and Dyslipidemia.                 GERD.  Sonographer:    AClayton LefortRDCS (AE) Referring Phys: [id number] HUGELMEYER  Sonographer Comments: Technically difficult study due to poor echo windows. IMPRESSIONS  1. Left ventricular ejection fraction, by estimation, is 60 to 65%. The left ventricle has normal function. The left ventricle has no regional wall motion abnormalities. Left ventricular diastolic parameters were normal.  2. Right ventricular systolic function was not well visualized. The right ventricular size is normal. There is normal pulmonary artery systolic pressure. The estimated right ventricular systolic pressure is [id number].  3. The mitral valve is normal in structure. No evidence of mitral valve regurgitation.  No evidence of mitral stenosis.  4. The aortic valve is  tricuspid. Aortic valve regurgitation is trivial. Mild to moderate aortic valve sclerosis/calcification is present, without any evidence of aortic stenosis.  5. The inferior vena cava is normal in size with greater than 50% respiratory variability, suggesting right atrial pressure of 3 mmHg. FINDINGS  Left Ventricle: Left ventricular ejection fraction, by estimation, is 60 to 65%. The left ventricle has normal function. The left ventricle has no regional wall motion abnormalities. The left ventricular internal cavity size was normal in size. There is  no left ventricular hypertrophy. Left ventricular diastolic parameters were normal. Normal left ventricular filling pressure. Right Ventricle: The right ventricular size is normal. No increase in right ventricular wall thickness. Right ventricular systolic function was not well visualized. There is normal pulmonary artery systolic pressure. The tricuspid regurgitant velocity is  2.63 m/s, and with an assumed right atrial pressure of 3 mmHg, the estimated right ventricular systolic pressure is 99991111 mmHg. Left Atrium: Left atrial size was normal in size. Right Atrium: Right atrial size was normal in size. Pericardium: There is no evidence of pericardial effusion. Presence of pericardial fat pad. Mitral Valve: The mitral valve is normal in structure. No evidence of mitral valve regurgitation. No evidence of mitral valve stenosis. Tricuspid Valve: The tricuspid valve is normal in structure. Tricuspid valve regurgitation is mild . No evidence of tricuspid stenosis. Aortic Valve: The aortic valve is tricuspid. Aortic valve regurgitation is trivial. Mild to moderate aortic valve sclerosis/calcification is present, without any evidence of aortic stenosis. Aortic valve mean gradient measures 2.0 mmHg. Aortic valve peak  gradient measures 3.7 mmHg. Aortic valve area, by VTI measures 1.82 cm. Pulmonic Valve: The pulmonic valve was normal in structure. Pulmonic valve  regurgitation is not visualized. No evidence of pulmonic stenosis. Aorta: The aortic root is normal in size and structure. Venous: The inferior vena cava is normal in size with greater than 50% respiratory variability, suggesting right atrial pressure of 3 mmHg. IAS/Shunts: There is redundancy of the interatrial septum. The interatrial septum appears to be lipomatous. No atrial level shunt detected by color flow Doppler.  LEFT VENTRICLE PLAX 2D LVIDd:         3.00 cm  Diastology LVIDs:         1.90 cm  LV e' medial:    9.03 cm/s LV PW:         1.20 cm  LV E/e' medial:  8.9 LV IVS:        1.10 cm  LV e' lateral:   9.36 cm/s LVOT diam:     1.90 cm  LV E/e' lateral: 8.6 LV SV:         31 LV SV Index:   23 LVOT Area:     2.84 cm  RIGHT VENTRICLE             IVC RV S prime:     13.60 cm/s  IVC diam: 1.20 cm TAPSE (M-mode): 1.3 cm LEFT ATRIUM           Index       RIGHT ATRIUM          Index LA diam:      1.70 cm 1.23 cm/m  RA Area:     8.88 cm LA Vol (A4C): 16.6 ml 12.00 ml/m RA Volume:   18.50 ml 13.37 ml/m  AORTIC VALVE AV Area (Vmax):    1.89 cm AV Area (Vmean):   1.90 cm AV Area (  VTI):     1.82 cm AV Vmax:           95.80 cm/s AV Vmean:          69.300 cm/s AV VTI:            0.173 m AV Peak Grad:      3.7 mmHg AV Mean Grad:      2.0 mmHg LVOT Vmax:         63.80 cm/s LVOT Vmean:        46.400 cm/s LVOT VTI:          0.111 m LVOT/AV VTI ratio: 0.64  AORTA Ao Root diam: 3.00 cm Ao Asc diam:  2.80 cm MITRAL VALVE               TRICUSPID VALVE MV Area (PHT): 7.66 cm    TR Peak grad:   27.7 mmHg MV Decel Time: 99 msec     TR Vmax:        263.00 cm/s MV E velocity: 80.70 cm/s MV A velocity: 88.00 cm/s  SHUNTS MV E/A ratio:  0.92        Systemic VTI:  0.11 m                            Systemic Diam: 1.90 cm Fransico Him MD Electronically signed by Fransico Him MD Signature Date/Time: 11/08/2020/11:36:13 AM    Final    VAS US CAROTID (at [facility] and [facility] only)  Result Date: 11/06/2020 Carotid Arterial Duplex Study  Patient Name:  EDMUND KIEN [facility] [place]  Date of Exam:   11/06/2020 Medical Rec #: [id number]             Accession #:    [id number] Date of Birth: October 31, 1945            Patient Gender: F Patient Age:   74Y Exam Location:  [facility] Procedure:      VAS US CAROTID Referring Phys: [id number] [place] --------------------------------------------------------------------------------  Indications:       CVA. Risk Factors:      None. Comparison Study:  11/05/2020 - CT ANGIO HEAD NECK W WO CM                    IMPRESSION:                    1. No intracranial arterial occlusion or high-grade stenosis.                    2. 80% stenosis of the proximal right internal carotid artery                    secondary to mixed density atherosclerosis. Performing Technologist: Oliver Hum RVT  Examination Guidelines: A complete evaluation includes B-mode imaging, spectral Doppler, color Doppler, and power Doppler as needed of all accessible portions of each vessel. Bilateral testing is considered an integral part of a complete examination. Limited examinations for reoccurring indications may be performed as noted.  Right Carotid Findings: +----------+--------+--------+--------+--------------------------+--------+           PSV cm/sEDV cm/sStenosisPlaque Description        Comments +----------+--------+--------+--------+--------------------------+--------+ CCA Prox  98      16              smooth and heterogenous            +----------+--------+--------+--------+--------------------------+--------+ CCA Distal39  10              smooth and heterogenous            +----------+--------+--------+--------+--------------------------+--------+ ICA Prox  374     162     80-99%  irregular and heterogenous         +----------+--------+--------+--------+--------------------------+--------+ ICA Mid   110     28              smooth and heterogenous             +----------+--------+--------+--------+--------------------------+--------+ ICA Distal62      22                                        tortuous +----------+--------+--------+--------+--------------------------+--------+ ECA       116     9                                                  +----------+--------+--------+--------+--------------------------+--------+ +----------+--------+-------+--------+-------------------+           PSV cm/sEDV cmsDescribeArm Pressure (mmHG) +----------+--------+-------+--------+-------------------+ [id number]                                         +----------+--------+-------+--------+-------------------+ +---------+--------+--+--------+--+---------+ VertebralPSV cm/s40EDV cm/s12Antegrade +---------+--------+--+--------+--+---------+  Left Carotid Findings: +----------+--------+--------+--------+-----------------------+--------+           PSV cm/sEDV cm/sStenosisPlaque Description     Comments +----------+--------+--------+--------+-----------------------+--------+ CCA Prox  70      14              smooth and heterogenous         +----------+--------+--------+--------+-----------------------+--------+ CCA Distal47      14              smooth and heterogenous         +----------+--------+--------+--------+-----------------------+--------+ ICA Prox  49      17              smooth and heterogenous         +----------+--------+--------+--------+-----------------------+--------+ ICA Distal68      24                                     tortuous +----------+--------+--------+--------+-----------------------+--------+ ECA       60      6                                               +----------+--------+--------+--------+-----------------------+--------+ +----------+--------+--------+--------+-------------------+           PSV cm/sEDV cm/sDescribeArm Pressure (mmHG)  +----------+--------+--------+--------+-------------------+ [id number]                                         +----------+--------+--------+--------+-------------------+ +---------+--------+--+--------+--+---------+ VertebralPSV cm/s40EDV cm/s13Antegrade +---------+--------+--+--------+--+---------+   Summary: Right Carotid: Velocities in the right ICA are consistent with a 80-99%  stenosis. Left Carotid: Velocities in the left ICA are consistent with a 1-39% stenosis. Vertebrals: Bilateral vertebral arteries demonstrate antegrade flow. *See table(s) above for measurements and observations.  Electronically signed by Antony Contras MD on 11/06/2020 at 1:00:26 PM.    Final

## 2020-11-10 ENCOUNTER — Inpatient Hospital Stay (HOSPITAL_COMMUNITY): Payer: Medicare Other

## 2020-11-10 DIAGNOSIS — I63511 Cerebral infarction due to unspecified occlusion or stenosis of right middle cerebral artery: Secondary | ICD-10-CM | POA: Diagnosis not present

## 2020-11-10 DIAGNOSIS — I639 Cerebral infarction, unspecified: Secondary | ICD-10-CM | POA: Diagnosis not present

## 2020-11-10 DIAGNOSIS — I6521 Occlusion and stenosis of right carotid artery: Secondary | ICD-10-CM | POA: Diagnosis not present

## 2020-11-10 LAB — CBC WITH DIFFERENTIAL/PLATELET
Abs Immature Granulocytes: 0.02 10*3/uL (ref 0.00–0.07)
Basophils Absolute: 0 10*3/uL (ref 0.0–0.1)
Basophils Relative: 0 %
Eosinophils Absolute: 0.3 10*3/uL (ref 0.0–0.5)
Eosinophils Relative: 6 %
HCT: 43.3 % (ref 36.0–46.0)
Hemoglobin: 14.4 g/dL (ref 12.0–15.0)
Immature Granulocytes: 0 %
Lymphocytes Relative: 21 %
Lymphs Abs: 1.1 10*3/uL (ref 0.7–4.0)
MCH: 29 pg (ref 26.0–34.0)
MCHC: 33.3 g/dL (ref 30.0–36.0)
MCV: 87.1 fL (ref 80.0–100.0)
Monocytes Absolute: 0.4 10*3/uL (ref 0.1–1.0)
Monocytes Relative: 8 %
Neutro Abs: 3.5 10*3/uL (ref 1.7–7.7)
Neutrophils Relative %: 65 %
Platelets: 188 10*3/uL (ref 150–400)
RBC: 4.97 MIL/uL (ref 3.87–5.11)
RDW: 12.2 % (ref 11.5–15.5)
WBC: 5.4 10*3/uL (ref 4.0–10.5)
nRBC: 0 % (ref 0.0–0.2)

## 2020-11-10 LAB — COMPREHENSIVE METABOLIC PANEL
ALT: 69 U/L — ABNORMAL HIGH (ref 0–44)
AST: 72 U/L — ABNORMAL HIGH (ref 15–41)
Albumin: 3.5 g/dL (ref 3.5–5.0)
Alkaline Phosphatase: 70 U/L (ref 38–126)
Anion gap: 13 (ref 5–15)
BUN: 16 mg/dL (ref 8–23)
CO2: 26 mmol/L (ref 22–32)
Calcium: 9.8 mg/dL (ref 8.9–10.3)
Chloride: 103 mmol/L (ref 98–111)
Creatinine, Ser: 0.69 mg/dL (ref 0.44–1.00)
GFR, Estimated: >60 mL/min (ref >60)
Glucose, Bld: 112 mg/dL — ABNORMAL HIGH (ref 70–99)
Potassium: 4 mmol/L (ref 3.5–5.1)
Sodium: 142 mmol/L (ref 135–145)
Total Bilirubin: 0.9 mg/dL (ref 0.3–1.2)
Total Protein: 6.2 g/dL — ABNORMAL LOW (ref 6.5–8.1)

## 2020-11-10 LAB — MAGNESIUM: Magnesium: 1.8 mg/dL (ref 1.7–2.4)

## 2020-11-10 NOTE — Progress Notes (Addendum)
Occupational Therapy Treatment Patient Details Name: [name] MRN: [id number] DOB: Oct 20, 1945 Today's Date: 11/10/2020    History of present illness 75 yo female presenting 7/20 after outpatient MRI revealed multiple subacute strokes. The pt reports she has been experiencing dizziness and weakness in LUE and bilateral LE for ~1 month prior to [place]. Work up revealed R MCA CVA, right 60 - 99 % carotid artery stenosis with planned revascularization next week. PMH includes: vertigo, HTN, and GERD.   OT comments  Pt progressing towards established OT goals. Focus session on challenging LUE coordination, strength, ROM, and activity tolerance. Pt performing LUE exercises during session and performing IADL cleaning table. Pt requiring rest breaks during session between exercises and reporting arm feeling heavy and fatigued. Pt continues to present with decreased strength, coordination, and activity tolerance. Continue to recommend discharge home with [facility] and will continue to follow acutely as admitted.   Follow Up Recommendations  Home health OT    Equipment Recommendations  None recommended by OT    Recommendations for Other Services      Precautions / Restrictions Precautions Precautions: Fall Restrictions Weight Bearing Restrictions: No       Mobility Bed Mobility Overal bed mobility: Needs Assistance Bed Mobility: Supine to Sit;Sit to Supine     Supine to sit: Supervision;HOB elevated Sit to supine: Supervision;HOB elevated   General bed mobility comments: Increased time, no physical assist required.    Transfers                 General transfer comment: Focus session on LUE ROM, strength, and coordination.    Balance Overall balance assessment: Needs assistance Sitting-balance support: No upper extremity supported;Feet unsupported Sitting balance-Leahy Scale: Good                                     ADL either performed or  assessed with clinical judgement   ADL Overall ADL's : Needs assistance/impaired                                       General ADL Comments: Focus session on LUE exercise ptogram challenging strength and coordination in preparation for participation in ADL/IADL. Pt performing IADL task cleaning table, see exercises below.     Vision   Vision Assessment?: No apparent visual deficits   Perception     Praxis      Cognition Arousal/Alertness: Awake/alert Behavior During Therapy: WFL for tasks assessed/performed Overall Cognitive Status: Difficult to assess                                 General Comments: Pt following simple commands and benefits from visual demonstrations due to language barrier.        Exercises Exercises: Other exercises;Hand exercises Hand Exercises Opposition: Left;AROM;5 reps;Seated (AROM finger opposition x5 each finger. Pt requiring increased time and with fatigue with exercise. supporting R hand at wrist during exercise.) Other Exercises Other Exercises: Table slides; challenging LUE strength, ROM, and coordiantion; LUE pushing wash cloth forward and backward Other Exercises: Performing abduction/adduction of digits of LUE. Requiring increased time. Tactile cues to bring 5th digit into adduction. Other Exercises: Pt picking up small cups with LUE to challenge fine motor coordination. Placung cup on top of  two other cups to make a pyramid of 3 cups. Pt presenting with fatigued bicep muscles. Other Exercises: Challenging gross motor strength, ROM and coordination, pt performing IADL washing table using LUE. Challenging shoulder flexion, and reaching forward. Pt with fatigue with Other Exercises: elbow flexion AAROM x5 repetitions   Shoulder Instructions       General Comments Pt pleasant throughout. Pt daughter on phone at beginning of session and introducing therapist to daughter.    Pertinent Vitals/ Pain       Pain  Assessment: Faces Faces Pain Scale: Hurts little more Pain Location: L hand and arm. Pt reporting numbness and pain. Pain Descriptors / Indicators: Discomfort;Grimacing;Guarding;Numbness;Heaviness Pain Intervention(s): Limited activity within patient's tolerance;Monitored during session  Home Living     Available Help at Discharge: Family Type of Home: House                              Lives With: Family    Prior Functioning/Environment              Frequency  Min 2X/week        Progress Toward Goals  OT Goals(current goals can now be found in the care plan section)  Progress towards OT goals: Progressing toward goals  Acute Rehab OT Goals Patient Stated Goal: to get better OT Goal Formulation: With patient Time For Goal Achievement: 11/20/20 Potential to Achieve Goals: Good ADL Goals Pt Will Perform Grooming: with supervision;standing Pt Will Perform Lower Body Dressing: with supervision;sit to/from stand Pt Will Transfer to Toilet: with modified independence Pt Will Perform Toileting - Clothing Manipulation and hygiene: with modified independence;sit to/from stand Additional ADL Goal #1: Patient or family will verbalize understanding of fine motor coordination HEP prior to discharge. Additional ADL Goal #2: Patient will demonstrate use of left hand consistently with bilateral hand tasks during dressing.  Plan Discharge plan remains appropriate    Co-evaluation                 AM-PAC OT "6 Clicks" Daily Activity     Outcome Measure   Help from another person eating meals?: A Little Help from another person taking care of personal grooming?: A Little Help from another person toileting, which includes using toliet, bedpan, or urinal?: A Little Help from another person bathing (including washing, rinsing, drying)?: A Little Help from another person to put on and taking off regular upper body clothing?: A Little Help from another person to  put on and taking off regular lower body clothing?: A Lot 6 Click Score: 17    End of Session    OT Visit Diagnosis: Hemiplegia and hemiparesis;Muscle weakness (generalized) (M62.81);Pain Hemiplegia - Right/Left: Left Hemiplegia - dominant/non-dominant: Non-Dominant Hemiplegia - caused by: Cerebral infarction Pain - Right/Left: Left Pain - part of body: Shoulder;Arm;Hand   Activity Tolerance Patient tolerated treatment well   Patient Left in bed;with call bell/phone within reach;with bed alarm set   Nurse Communication Mobility status        Time: [id number] OT Time Calculation (min): 31 min  Charges: OT General Charges $OT Visit: 1 Visit OT Treatments $Therapeutic Exercise: 23-37 mins  Shanda Howells, OTDS    Shanda Howells 11/10/2020, 1:13 PM

## 2020-11-10 NOTE — Care Management Important Message (Signed)
Important Message  Patient Details  Name: [name] MRN: [id number] Date of Birth: Aug 20, 1945   Medicare Important Message Given:        Orbie Pyo 11/10/2020, 4:05 PM

## 2020-11-10 NOTE — Evaluation (Signed)
Speech Language Pathology Evaluation Patient Details Name: Paridhi Wisman MRN: [id number] DOB: 1946-01-08 Today's Date: 11/10/2020 Time: [id number] SLP Time Calculation (min) (ACUTE ONLY): 23 min  Problem List:  Patient Active Problem List   Diagnosis Date Noted   Protein-calorie malnutrition, severe 11/09/2020   Carotid stenosis, right 11/06/2020   Hyperlipidemia 11/06/2020   CVA (cerebral vascular accident) ([place]) 11/05/2020   Rectal bleeding 06/30/2018   Palpitations 10/19/2016   Lightheadedness 10/19/2016   GERD (gastroesophageal reflux disease) 10/12/2012   Dilated extrahepatic bile ducts 01/21/2012   VERTIGO 06/04/2008   HEADACHE 06/04/2008   Insomnia 04/01/2008   EPIGASTRIC PAIN, CHRONIC 03/22/2008   PERSONAL HISTORY GIST,  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/2007   Past Medical History:  Past Medical History:  Diagnosis Date   Acute pancreatitis after ERCP  01/22/2012   Arthritis    EPIGASTRIC PAIN, CHRONIC 03/22/2008        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 female with a known history of hypertension, GERD, vertigo was in a usual state of health until 1 month ago when she developed sudden onset of headache associated with left arm weakness and bilateral lower extremity weakness, work-up showed right MCA CVA.   Assessment / Plan / Recommendation Clinical Impression  Pt demonstrates adequate cognitive  linguistic function despite being evaluated without interpreter present (device broken). Pts comprehension and language in her non native language was quite fluent and accurate and we were able to have a very good conversation. Articulation also WNL. Pt does demonstrate mild short term memory impairment and verbalizes that she has struggled with this in conjunction with her dizziness and headache. Pt would benefit from [facility] SLP f/u for higher level cognition, though her family is able to assist with functional needs and her daughter already manages her medication etc. No further acute SLP needs at this time.    SLP Assessment  SLP Recommendation/Assessment: All further Speech Lanaguage Pathology  needs can be addressed in the next venue of care SLP Visit Diagnosis: Cognitive communication deficit (R41.841)    Follow Up Recommendations  Home health SLP    Frequency and Duration           SLP Evaluation Cognition  Overall Cognitive Status: Impaired/Different from baseline Orientation Level: Oriented X4 Attention: Selective Selective Attention: Appears intact Memory: Impaired Memory Impairment: Decreased short term memory Awareness: Appears intact Problem Solving: Appears intact Safety/Judgment: Appears intact       Comprehension  Auditory Comprehension Overall Auditory Comprehension: Appears within functional limits for tasks assessed Reading Comprehension Reading Status: Not tested    Expression Verbal Expression Overall Verbal Expression: Appears within functional limits for tasks assessed   Oral / Motor  Oral Motor/Sensory Function Overall Oral Motor/Sensory Function: Within functional limits Motor Speech Overall Motor Speech: Appears within functional limits for tasks assessed   GO  Gerianne Simonet, Katherene Ponto 11/10/2020, 11:44 AM

## 2020-11-10 NOTE — Progress Notes (Signed)
[facility] [place] PROGRESS NOTE  ASSESSMENT / PLAN: [name] is a 75 y.o. female with symptomatic right internal carotid artery stenosis. Continue ASA / Plavix / Statin. Plan R TCAR Wednesday 11/12/20.    SUBJECTIVE: No interval change. L hand weakness persists. Reports headache.  OBJECTIVE: BP (!) 149/71 (BP Location: Left Arm)   Pulse 76   Temp 98.1 F (36.7 C) (Oral)   Resp 12   Ht 4' 11.5" (1.511 m)   Wt 45.5 kg   SpO2 99%   BMI 19.92 kg/m   Intake/Output Summary (Last 24 hours) at 11/10/2020 1030 Last data filed at 11/10/2020 0455 Gross per 24 hour  Intake 480 ml  Output --  Net 480 ml    Constitutional: Thin appearing. no acute distress. CNS: 4/5 LUE. Otherwise 5/5. Cardiac: RRR. Pulmonary: unlabored  CBC Latest Ref Rng & Units 11/10/2020 11/09/2020 11/08/2020  WBC 4.0 - 10.5 K/uL 5.4 5.4 5.8  Hemoglobin 12.0 - 15.0 g/dL 14.4 14.1 14.8  Hematocrit 36.0 - 46.0 % 43.3 41.3 42.9  Platelets 150 - 400 K/uL 188 177 211     CMP Latest Ref Rng & Units 11/10/2020 11/09/2020 11/08/2020  Glucose 70 - 99 mg/dL 112(H) 133(H) 116(H)  BUN 8 - 23 mg/dL 16 27(H) 29(H)  Creatinine 0.44 - 1.00 mg/dL 0.69 0.81 0.97  Sodium 135 - 145 mmol/L 142 141 140  Potassium 3.5 - 5.1 mmol/L 4.0 4.0 3.8  Chloride 98 - 111 mmol/L 103 107 106  CO2 22 - 32 mmol/L '26 25 26  '$ Calcium 8.9 - 10.3 mg/dL 9.8 9.4 10.0  Total Protein 6.5 - 8.1 g/dL 6.2(L) 5.7(L) 6.1(L)  Total Bilirubin 0.3 - 1.2 mg/dL 0.9 0.8 0.8  Alkaline Phos 38 - 126 U/L 70 69 70  AST 15 - 41 U/L 72(H) 65(H) 28  ALT 0 - 44 U/L 69(H) 48(H) 26    Estimated Creatinine Clearance: 43.9 mL/min (by C-G formula based on SCr of 0.69 mg/dL).  [name]. Stanford Breed, MD [name] Phone Number: [phone] 11/10/2020 10:30 AM

## 2020-11-10 NOTE — Progress Notes (Signed)
PROGRESS NOTE                                                                                                                                                                                                             Patient Demographics:    [name], is a 75 y.o. female, DOB - 09-May-1945, ZES:[id number]  Outpatient Primary MD for the patient is Lavone Orn, MD    LOS - 5  Admit date - 11/05/2020    Chief Complaint  Patient presents with   Abnormal Lab       Brief Narrative (HPI from H&P) - [name] is a 75 y.o. female with a known history of hypertension, GERD, vertigo was in a usual state of health until 1 month ago when she developed sudden onset of headache associated with left arm weakness and bilateral lower extremity weakness, work-up showed right MCA CVA.   Subjective:   Patient in bed appears to be in no distress denies any headache chest or abdominal pain, left arm weakness and numbness stable.  Left leg weakness much improved.   Assessment  & Plan :     Left-sided weakness arm more than leg due to acute Right MCA stroke.  With evidence of right ICA significant stenosis greater than 80%, stroke team following she is on dual antiplatelet therapy and high intensity statin will stroke work-up underway, LDL was above goal, A1c was stable, able echocardiogram with preserved EF of 65%.  DW VVS and  CVA team Dr Leonie Man 11/07/2020, will try to keep systolic blood pressure untreated around 150.  He underwent a repeat MRI on 11/09/2020 which is stable.  Case discussed with vascular surgery likely carotid surgery on 11/12/2020.  2.  Dyslipidemia.  Placed on high intensity statin.  3.  R. ICA significant stenosis with right MCA stroke.  On dual antiplatelet therapy and statin, vascular surgery consulted for possible surgery on 11/12/2020.   4. HTN - permissive due to Acute CVA, trying to keep systolic around 263 as  she has significant critical stenosis in right ICA with waxing waning left-sided weakness.  5.  Mild asymptomatic transaminitis.  Likely  due to statin.  Will monitor, check RUQ [place].  Hepatic Function Latest Ref Rng & Units 11/10/2020 11/09/2020 11/08/2020  Total Protein 6.5 - 8.1 g/dL 6.2(L) 5.7(L) 6.1(L)  Albumin 3.5 - 5.0 g/dL 3.5 3.2(L) 3.4(L)  AST 15 - 41 U/L 72(H) 65(H) 28  ALT 0 - 44 U/L 69(H) 48(H) 26  Alk Phosphatase 38 - 126 U/L 70 69 70  Total Bilirubin 0.3 - 1.2 mg/dL 0.9 0.8 0.8  Bilirubin, Direct 0.0 - 0.3 mg/dL - - -        Condition - Extremely Guarded  Family Communication  :  grand son bedside 11/07/20, daughter Prudence Davidson [phone] on 11/10/20  Code Status :  Full  Consults  :  Neuro, VVS  PUD Prophylaxis : PPI   Procedures  :     MRI - 1. Subacute right MCA infarcts. 2. Mild chronic small vessel ischemic disease  CTA - 1. No intracranial arterial occlusion or high-grade stenosis. 2. 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 Aortic Atherosclerosis (ICD10-I70.0).   Carotid US - 1. No intracranial arterial occlusion or high-grade stenosis. 2. 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 Aortic Atherosclerosis.  TTE -  1. Left ventricular ejection fraction, by estimation, is 60 to 65%. The left ventricle has normal function. The left ventricle has no regional wall motion abnormalities. Left ventricular diastolic parameters were normal.  2. Right ventricular systolic function was not well visualized. The right ventricular size is normal. There is normal pulmonary artery systolic pressure. The estimated right ventricular systolic pressure is 95.2 mmHg.  3. The mitral valve is normal in structure. No evidence of mitral valve regurgitation. No evidence of mitral stenosis.  4. The aortic valve is tricuspid. Aortic valve regurgitation is trivial. Mild to moderate  aortic valve sclerosis/calcification is present, without any evidence of aortic stenosis.  5. The inferior vena cava is normal in size with greater than 50% respiratory variability, suggesting right atrial pressure of 3 mmHg.      Disposition Plan  :    Status is: Inpatient  Remains inpatient appropriate because:IV treatments appropriate due to intensity of illness or inability to take PO  Dispo: The patient is from: Home              Anticipated d/c is to: Home              Patient currently is not medically stable to d/c.   Difficult to place patient No  DVT Prophylaxis  :    enoxaparin (LOVENOX) injection 30 mg Start: 11/05/20 2200 SCD's Start: 11/05/20 1955     Lab Results  Component Value Date   PLT 188 11/10/2020    Diet :  Diet Order             Diet regular Room service appropriate? Yes; Fluid consistency: Thin  Diet effective now                    Inpatient Medications  Scheduled Meds:   stroke: mapping our early stages of recovery book   Does not apply Once   aspirin EC  81 mg Oral Daily   atorvastatin  80 mg Oral Daily   clopidogrel  75 mg Oral Daily   enoxaparin (LOVENOX) injection  30 mg Subcutaneous Q24H   feeding supplement  237 mL Oral BID BM   multivitamin with minerals  1 tablet Oral Daily  pantoprazole  40 mg Oral q AM   Continuous Infusions:   PRN Meds:.[DISCONTINUED] acetaminophen **OR** [DISCONTINUED] acetaminophen (TYLENOL) oral liquid 160 mg/5 mL **OR** acetaminophen, acetaminophen, meclizine, senna-docusate  Antibiotics  :    Anti-infectives (From admission, onward)    None        Time Spent in minutes  30   Lala Lund M.D on 11/10/2020 at 10:14 AM  To page go to [email or web address]   [facility] -  Office  [phone]    See all Orders from today for further details    Objective:   Vitals:   11/09/20 1954 11/10/20 0014 11/10/20 0432 11/10/20 0845  BP: (!) 164/79 130/72 (!) 148/82 (!) 149/71  Pulse: 85  73 77 76  Resp: _0 Temp: 97.7 F (36.5 C) 97.8 F (36.6 C) 97.6 F (36.4 C) 98.1 F (36.7 C)  TempSrc: Oral Oral Oral Oral  SpO2: 98% 97% 98% 99%  Weight:      Height:        Wt Readings from Last 3 Encounters:  11/06/20 45.5 kg  07/10/19 45.5 kg  07/11/18 43.5 kg     Intake/Output Summary (Last 24 hours) at 11/10/2020 1014 Last data filed at 11/10/2020 0455 Gross per 24 hour  Intake 480 ml  Output --  Net 480 ml      Physical Exam  Awake Alert, No new F.N deficits, continues to have some left-sided weakness arm more than leg, arm 3-4/5 [place].AT,PERRAL Supple Neck,No JVD, No cervical lymphadenopathy appriciated.  Symmetrical Chest wall movement, Good air movement bilaterally, CTAB RRR,No Gallops, Rubs or new Murmurs, No Parasternal Heave +ve B.Sounds, Abd Soft, No tenderness, No organomegaly appriciated, No rebound - guarding or rigidity. No Cyanosis, Clubbing or edema, No new Rash or bruise       Data Review:    CBC Recent Labs  Lab 11/05/20 1731 11/08/20 0330 11/09/20 0136 11/10/20 0408  WBC 8.2 5.8 5.4 5.4  HGB 15.4* 14.8 14.1 14.4  HCT 45.0 42.9 41.3 43.3  PLT 213 211 177 188  MCV 86.4 85.1 86.0 87.1  MCH 29.6 29.4 29.4 29.0  MCHC 34.2 34.5 34.1 33.3  RDW 12.1 12.2 12.1 12.2  LYMPHSABS 1.4 1.4 1.3 1.1  MONOABS 0.6 0.4 0.5 0.4  EOSABS 0.1 0.3 0.3 0.3  BASOSABS 0.0 0.0 0.0 0.0    Recent Labs  Lab 11/05/20 1731 11/06/20 0500 11/07/20 1104 11/08/20 0330 11/09/20 0136 11/10/20 0408  NA 142  --  140 140 141 142  K 3.4*  --  4.4 3.8 4.0 4.0  CL 108  --  107 106 107 103  CO2 22  --  _1 GLUCOSE 125*  --  113* 116* 133* 112*  BUN 15  --  25* 29* 27* 16  CREATININE 0.67  --  0.83 0.97 0.81 0.69  CALCIUM 9.5  --  9.5 10.0 9.4 9.8  AST 23  --   --  28 65* 72*  ALT 20  --   --  26 48* 69*  ALKPHOS 73  --   --  70 69 70  BILITOT 0.9  --   --  0.8 0.8 0.9  ALBUMIN 4.6  --   --  3.4* 3.2* 3.5  MG  --   --  1.8 1.8 1.8 1.8  INR  1.0  --  1.0  --   --   --   HGBA1C  --  6.0*  --   --   --   --     ------------------------------------------------------------------------------------------------------------------  No results for input(s): CHOL, HDL, LDLCALC, TRIG, CHOLHDL, LDLDIRECT in the last 72 hours.   Lab Results  Component Value Date   HGBA1C 6.0 (H) 11/06/2020   ------------------------------------------------------------------------------------------------------------------ No results for input(s): TSH, T4TOTAL, T3FREE, THYROIDAB in the last 72 hours.  Invalid input(s): FREET3  Cardiac Enzymes No results for input(s): CKMB, TROPONINI, MYOGLOBIN in the last 168 hours.  Invalid input(s): CK ------------------------------------------------------------------------------------------------------------------ No results found for: BNP   Radiology Reports CT ANGIO HEAD NECK W WO CM  Result Date: 11/05/2020 CLINICAL DATA:  Ataxia, stroke suspected EXAM: CT ANGIOGRAPHY HEAD AND NECK TECHNIQUE: Multidetector CT imaging of the head and neck was performed using the standard protocol during bolus administration of intravenous contrast. Multiplanar CT image reconstructions and MIPs were obtained to evaluate the vascular anatomy. Carotid stenosis measurements (when applicable) are obtained utilizing NASCET criteria, using the distal internal carotid diameter as the denominator. CONTRAST:  48m OMNIPAQUE IOHEXOL 350 MG/ML SOLN COMPARISON:  None. FINDINGS: CT HEAD FINDINGS Brain: There is no mass, hemorrhage or extra-axial collection. The size and configuration of the ventricles and extra-axial CSF spaces are normal. There is no acute or chronic infarction. The brain parenchyma is normal. Skull: The visualized skull base, calvarium and extracranial soft tissues are normal. Sinuses/Orbits: No fluid levels or advanced mucosal thickening of the visualized paranasal sinuses. No mastoid or middle ear effusion. The orbits are normal.  CTA NECK FINDINGS SKELETON: There is no bony spinal canal stenosis. No lytic or blastic lesion. OTHER NECK: Normal pharynx, larynx and major salivary glands. No cervical lymphadenopathy. Unremarkable thyroid gland. UPPER CHEST: No pneumothorax or pleural effusion. No nodules or masses. AORTIC ARCH: There is calcific atherosclerosis of the aortic arch. There is no aneurysm, dissection or hemodynamically significant stenosis of the visualized portion of the aorta. Conventional 3 vessel aortic branching pattern. The visualized proximal subclavian arteries are widely patent. RIGHT CAROTID SYSTEM: No dissection, occlusion or aneurysm. There is mixed density atherosclerosis extending into the proximal ICA, resulting in 80% stenosis. LEFT CAROTID SYSTEM: Normal without aneurysm, dissection or stenosis. VERTEBRAL ARTERIES: Left dominant configuration. Both origins are clearly patent. There is no dissection, occlusion or flow-limiting stenosis to the skull base (V1-V3 segments). CTA HEAD FINDINGS POSTERIOR CIRCULATION: --Vertebral arteries: Normal V4 segments. --Inferior cerebellar arteries: Normal. --Basilar artery: Normal. --Superior cerebellar arteries: Normal. --Posterior cerebral arteries (PCA): Normal. ANTERIOR CIRCULATION: --Intracranial internal carotid arteries: Normal. --Anterior cerebral arteries (ACA): Normal. Both A1 segments are present. Patent anterior communicating artery (a-comm). --Middle cerebral arteries (MCA): Normal. VENOUS SINUSES: As permitted by contrast timing, patent. ANATOMIC VARIANTS: None Review of the MIP images confirms the above findings. IMPRESSION: 1. No intracranial arterial occlusion or high-grade stenosis. 2. 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 Aortic Atherosclerosis (ICD10-I70.0). Electronically Signed   By: KUlyses JarredM.D.   On: 11/05/2020 19:58   DG Chest 2 View  Result Date: 11/05/2020 CLINICAL DATA:  Stroke,  left arm weakness EXAM: CHEST - 2 VIEW COMPARISON:  03/24/2015 FINDINGS: The heart size and mediastinal contours are within normal limits. Both lungs are clear. The visualized skeletal structures are unremarkable. IMPRESSION: No active cardiopulmonary disease. Electronically Signed   By: AFidela SalisburyMD   On: 11/05/2020 20:32   MR BRAIN WO CONTRAST  Result Date: 11/09/2020 CLINICAL DATA:  Stroke follow-up. EXAM: MRI HEAD WITHOUT CONTRAST TECHNIQUE: Multiplanar, multiecho pulse sequences of the brain and surrounding structures were obtained without intravenous contrast. COMPARISON:  11/05/2020 FINDINGS: Brain: Limited sequence exam  per provider request, only diffusion and gradient imaging. Fading patchy restricted diffusion in the high right cerebral white matter and frontal parietal cortex. No acute hemorrhage or acute infarct. No hydrocephalus or collection. Vascular: No T2 weighted imaging Skull and upper cervical spine: Unremarkable Sinuses/Orbits: Unremarkable IMPRESSION: No acute or progressive infarct.  No interval hemorrhage. Electronically Signed   By: Monte Fantasia M.D.   On: 11/09/2020 10:57   MR BRAIN W WO CONTRAST  Result Date: 11/05/2020 CLINICAL DATA:  Essential hypertension.  Headache. EXAM: MRI HEAD WITHOUT AND WITH CONTRAST TECHNIQUE: Multiplanar, multiecho pulse sequences of the brain and surrounding structures were obtained without and with intravenous contrast. CONTRAST:  78m MULTIHANCE GADOBENATE DIMEGLUMINE 529 MG/ML IV SOLN COMPARISON:  06/09/2008 FINDINGS: Brain: There are multiple small regions of diffusion weighted signal abnormality in the right frontal and parietal lobes involving white matter of the centrum semiovale as well as cortex and subcortical white matter towards the vertex compatible with subacute infarcts (MCA and deep watershed territory). There is associated gyriform enhancement most notable in the parietal lobe where there is also a small focus of petechial  hemorrhage. Small T2 hyperintensities elsewhere in the cerebral white matter bilaterally are nonspecific but compatible with mild chronic small vessel ischemic disease. There is mild cerebral atrophy. No mass, midline shift, or extra-axial fluid collection is identified. Vascular: Major intracranial vascular flow voids are preserved. Skull and upper cervical spine: Unremarkable bone marrow signal. Sinuses/Orbits: Bilateral cataract extraction. Paranasal sinuses and mastoid air cells are clear. Other: None. IMPRESSION: 1. Subacute right MCA infarcts. 2. Mild chronic small vessel ischemic disease. Electronically Signed   By: ALogan BoresM.D.   On: 11/05/2020 13:36   ECHOCARDIOGRAM COMPLETE  Result Date: 11/08/2020    ECHOCARDIOGRAM REPORT   [facility]   [facility]2020 Medical Rec #:  [id number]           Height:       59.5 in Accession #:    [id number]          Weight:       100.3 lb Date of Birth:  103-03-1946          BSA:          1.384 m Patient Age:    89years             BP:           134/80 mmHg Patient Gender: F                    HR:           84 bpm. Exam Location:  Inpatient Procedure: 2D Echo, Cardiac Doppler and Color Doppler Indications:    Stroke  History:        Patient has prior history of Echocardiogram examinations, most                 recent 04/07/2018. Risk Factors:Hypertension and Dyslipidemia.                 GERD.  Sonographer:    AClayton LefortRDCS (AE) Referring Phys: [id number] HUGELMEYER  Sonographer Comments: Technically difficult study due to poor echo windows. IMPRESSIONS  1. Left ventricular ejection fraction, by estimation, is 60 to 65%. The left ventricle has normal function. The left ventricle has no regional wall motion abnormalities. Left ventricular diastolic parameters were normal.  2. Right ventricular systolic function was not well  visualized. The right ventricular size is normal. There is normal pulmonary artery systolic pressure. The  estimated right ventricular systolic pressure is 89.3 mmHg.  3. The mitral valve is normal in structure. No evidence of mitral valve regurgitation. No evidence of mitral stenosis.  4. The aortic valve is tricuspid. Aortic valve regurgitation is trivial. Mild to moderate aortic valve sclerosis/calcification is present, without any evidence of aortic stenosis.  5. The inferior vena cava is normal in size with greater than 50% respiratory variability, suggesting right atrial pressure of 3 mmHg. FINDINGS  Left Ventricle: Left ventricular ejection fraction, by estimation, is 60 to 65%. The left ventricle has normal function. The left ventricle has no regional wall motion abnormalities. The left ventricular internal cavity size was normal in size. There is  no left ventricular hypertrophy. Left ventricular diastolic parameters were normal. Normal left ventricular filling pressure. Right Ventricle: The right ventricular size is normal. No increase in right ventricular wall thickness. Right ventricular systolic function was not well visualized. There is normal pulmonary artery systolic pressure. The tricuspid regurgitant velocity is  2.63 m/s, and with an assumed right atrial pressure of 3 mmHg, the estimated right ventricular systolic pressure is 73.4 mmHg. Left Atrium: Left atrial size was normal in size. Right Atrium: Right atrial size was normal in size. Pericardium: There is no evidence of pericardial effusion. Presence of pericardial fat pad. Mitral Valve: The mitral valve is normal in structure. No evidence of mitral valve regurgitation. No evidence of mitral valve stenosis. Tricuspid Valve: The tricuspid valve is normal in structure. Tricuspid valve regurgitation is mild . No evidence of tricuspid stenosis. Aortic Valve: The aortic valve is tricuspid. Aortic valve regurgitation is trivial. Mild to moderate aortic valve sclerosis/calcification is present, without any evidence of aortic stenosis. Aortic valve mean  gradient measures 2.0 mmHg. Aortic valve peak  gradient measures 3.7 mmHg. Aortic valve area, by VTI measures 1.82 cm. Pulmonic Valve: The pulmonic valve was normal in structure. Pulmonic valve regurgitation is not visualized. No evidence of pulmonic stenosis. Aorta: The aortic root is normal in size and structure. Venous: The inferior vena cava is normal in size with greater than 50% respiratory variability, suggesting right atrial pressure of 3 mmHg. IAS/Shunts: There is redundancy of the interatrial septum. The interatrial septum appears to be lipomatous. No atrial level shunt detected by color flow Doppler.  LEFT VENTRICLE PLAX 2D LVIDd:         3.00 cm  Diastology LVIDs:         1.90 cm  LV e' medial:    9.03 cm/s LV PW:         1.20 cm  LV E/e' medial:  8.9 LV IVS:        1.10 cm  LV e' lateral:   9.36 cm/s LVOT diam:     1.90 cm  LV E/e' lateral: 8.6 LV SV:         31 LV SV Index:   23 LVOT Area:     2.84 cm  RIGHT VENTRICLE             IVC RV S prime:     13.60 cm/s  IVC diam: 1.20 cm TAPSE (M-mode): 1.3 cm LEFT ATRIUM           Index       RIGHT ATRIUM          Index LA diam:      1.70 cm 1.23 cm/m  RA Area:  8.88 cm LA Vol (A4C): 16.6 ml 12.00 ml/m RA Volume:   18.50 ml 13.37 ml/m  AORTIC VALVE AV Area (Vmax):    1.89 cm AV Area (Vmean):   1.90 cm AV Area (VTI):     1.82 cm AV Vmax:           95.80 cm/s AV Vmean:          69.300 cm/s AV VTI:            0.173 m AV Peak Grad:      3.7 mmHg AV Mean Grad:      2.0 mmHg LVOT Vmax:         63.80 cm/s LVOT Vmean:        46.400 cm/s LVOT VTI:          0.111 m LVOT/AV VTI ratio: 0.64  AORTA Ao Root diam: 3.00 cm Ao Asc diam:  2.80 cm MITRAL VALVE               TRICUSPID VALVE MV Area (PHT): 7.66 cm    TR Peak grad:   27.7 mmHg MV Decel Time: 99 msec     TR Vmax:        263.00 cm/s MV E velocity: 80.70 cm/s MV A velocity: 88.00 cm/s  SHUNTS MV E/A ratio:  0.92        Systemic VTI:  0.11 m                            Systemic Diam: 1.90 cm Fransico Him MD  Electronically signed by Fransico Him MD Signature Date/Time: 11/08/2020/11:36:13 AM    Final    VAS US CAROTID (at [facility] and [facility] only)  Result Date: 11/06/2020 Carotid Arterial Duplex Study Patient Name:  ARLEN LEGENDRE [facility]  Date of Exam:   11/06/2020 Medical Rec #: [id number]             Accession #:    [id number] Date of Birth: Feb 27, 1946            Patient Gender: F Patient Age:   74Y Exam Location:  [facility] Procedure:      VAS US CAROTID Referring Phys: [id number] Maribel --------------------------------------------------------------------------------  Indications:       CVA. Risk Factors:      None. Comparison Study:  11/05/2020 - CT ANGIO HEAD NECK W WO CM                    IMPRESSION:                    1. No intracranial arterial occlusion or high-grade stenosis.                    2. 80% stenosis of the proximal right internal carotid artery                    secondary to mixed density atherosclerosis. Performing Technologist: Oliver Hum RVT  Examination Guidelines: A complete evaluation includes B-mode imaging, spectral Doppler, color Doppler, and power Doppler as needed of all accessible portions of each vessel. Bilateral testing is considered an integral part of a complete examination. Limited examinations for reoccurring indications may be performed as noted.  Right Carotid Findings: +----------+--------+--------+--------+--------------------------+--------+           PSV cm/sEDV cm/sStenosisPlaque Description        Comments +----------+--------+--------+--------+--------------------------+--------+ CCA Prox  98      16              smooth and heterogenous            +----------+--------+--------+--------+--------------------------+--------+ CCA Distal39      10              smooth and heterogenous            +----------+--------+--------+--------+--------------------------+--------+ ICA Prox  374     162     80-99%  irregular and heterogenous          +----------+--------+--------+--------+--------------------------+--------+ ICA Mid   110     28              smooth and heterogenous            +----------+--------+--------+--------+--------------------------+--------+ ICA Distal62      22                                        tortuous +----------+--------+--------+--------+--------------------------+--------+ ECA       116     9                                                  +----------+--------+--------+--------+--------------------------+--------+ +----------+--------+-------+--------+-------------------+           PSV cm/sEDV cmsDescribeArm Pressure (mmHG) +----------+--------+-------+--------+-------------------+ [id number]                                         +----------+--------+-------+--------+-------------------+ +---------+--------+--+--------+--+---------+ VertebralPSV cm/s40EDV cm/s12Antegrade +---------+--------+--+--------+--+---------+  Left Carotid Findings: +----------+--------+--------+--------+-----------------------+--------+           PSV cm/sEDV cm/sStenosisPlaque Description     Comments +----------+--------+--------+--------+-----------------------+--------+ CCA Prox  70      14              smooth and heterogenous         +----------+--------+--------+--------+-----------------------+--------+ CCA Distal47      14              smooth and heterogenous         +----------+--------+--------+--------+-----------------------+--------+ ICA Prox  49      17              smooth and heterogenous         +----------+--------+--------+--------+-----------------------+--------+ ICA Distal68      24                                     tortuous +----------+--------+--------+--------+-----------------------+--------+ ECA       60      6                                               +----------+--------+--------+--------+-----------------------+--------+  +----------+--------+--------+--------+-------------------+           PSV cm/sEDV cm/sDescribeArm Pressure (mmHG) +----------+--------+--------+--------+-------------------+ [id number]                                         +----------+--------+--------+--------+-------------------+ +---------+--------+--+--------+--+---------+  VertebralPSV cm/s40EDV cm/s13Antegrade +---------+--------+--+--------+--+---------+   Summary: Right Carotid: Velocities in the right ICA are consistent with a 80-99%                stenosis. Left Carotid: Velocities in the left ICA are consistent with a 1-39% stenosis. Vertebrals: Bilateral vertebral arteries demonstrate antegrade flow. *See table(s) above for measurements and observations.  Electronically signed by Antony Contras MD on 11/06/2020 at 1:00:26 PM.    Final

## 2020-11-10 NOTE — Progress Notes (Addendum)
Physical Therapy Treatment Patient Details Name: [name] MRN: [id number] DOB: 04-Oct-1945 Today's Date: 11/10/2020    History of Present Illness 75 yo female presenting 7/20 after outpatient MRI revealed multiple subacute strokes. The pt reports she has been experiencing dizziness and weakness in LUE and bilateral LE for ~1 month prior to [place]. Work up revealed R MCA CVA, right 57 - 99 % carotid artery stenosis with planned revascularization sx 11/17/20. PMH includes: vertigo, HTN, and GERD.    PT Comments    Pt walked approximately the same distance as last session, limited by increased fatigue in L leg and increased HA with mobility.  She is starting to let go of RW for gait around the room and reports low back pain at the end of walk.  We may start to do some gait training without the RW as she is likely to park it at home.  Gross vestibular screen was not significant and given the reports of near constant dizziness, she is not likely to have a BPPV.  Her dizziness may be related to her current intense HA.  She has h/o vertigo listed in her chart, but I do not see at least any [facility] OP therapy for it. PT will continue to follow acutely for safe mobility progression.   Follow Up Recommendations  Home health PT;Supervision for mobility/OOB     Equipment Recommendations  Rolling walker with 5" wheels    Recommendations for Other Services       Precautions / Restrictions Precautions Precautions: Fall    Mobility  Bed Mobility Overal bed mobility: Needs Assistance Bed Mobility: Supine to Sit     Supine to sit: Modified independent (Device/Increase time)     General bed mobility comments: Pt able to come to EOB given increased time, rail used to pull up.    Transfers Overall transfer level: Needs assistance Equipment used: Rolling walker (2 wheeled) Transfers: Sit to/from Stand Sit to Stand: Min guard         General transfer comment: min guard assist for  safety, cues for safe UE placement during transitions  Ambulation/Gait Ambulation/Gait assistance: Min assist Gait Distance (Feet): 75 Feet Assistive device: Rolling walker (2 wheeled);1 person hand held assist Gait Pattern/deviations: Step-through pattern;Decreased step length - left     General Gait Details: Left foot externally rotated and seems to fatigue on this side with increased distance.  Pt limited today by HA with mobility.  She reports "dizziness" that is constant and holds her head in sitting.  She reports she has had tylenol today.  I did a gross vestibular/occulomotor screen without significant signs of deficits and due to reports of near constant dizziness, I don't believe it would be a canal/BPPV problem.  I see she has h/o vertigo listed in her PMH as well.   Stairs             Wheelchair Mobility    Modified Rankin (Stroke Patients Only) Modified Rankin (Stroke Patients Only) Pre-Morbid Rankin Score: Moderate disability Modified Rankin: Moderately severe disability     Balance Overall balance assessment: Needs assistance Sitting-balance support: Feet supported;No upper extremity supported Sitting balance-Leahy Scale: Good     Standing balance support: Bilateral upper extremity supported;No upper extremity supported;Single extremity supported Standing balance-Leahy Scale: Fair Standing balance comment: close supervision while pulling up underwear in bathroom with both hands.  Cognition Arousal/Alertness: Awake/alert Behavior During Therapy: WFL for tasks assessed/performed Overall Cognitive Status: Difficult to assess                                 General Comments: broken english, no interpreter available at this time.  Communicates well enough to participate and understands what I am asking her, but difficult for her to describe symptoms to do thorough vestibular eval.      Exercises       General Comments        Pertinent Vitals/Pain Pain Assessment: Faces Faces Pain Scale: Hurts even more Pain Location: head Pain Descriptors / Indicators: Aching Pain Intervention(s): Monitored during session;Limited activity within patient's tolerance;Repositioned    Home Living                      Prior Function            PT Goals (current goals can now be found in the care plan section) Acute Rehab PT Goals Patient Stated Goal: to get better Progress towards PT goals: Progressing toward goals    Frequency    Min 4X/week      PT Plan Current plan remains appropriate    Co-evaluation              AM-PAC PT "6 Clicks" Mobility   Outcome Measure  Help needed turning from your back to your side while in a flat bed without using bedrails?: A Little Help needed moving from lying on your back to sitting on the side of a flat bed without using bedrails?: A Little Help needed moving to and from a bed to a chair (including a wheelchair)?: A Little Help needed standing up from a chair using your arms (e.g., wheelchair or bedside chair)?: A Little Help needed to walk in hospital room?: A Little Help needed climbing 3-5 steps with a railing? : A Little 6 Click Score: 18    End of Session Equipment Utilized During Treatment: Gait belt Activity Tolerance: Patient limited by pain (limited by HA) Patient left: in bed;with call bell/phone within reach;with bed alarm set (seated EOB awaiting her dinner.)   PT Visit Diagnosis: Unsteadiness on feet (R26.81);Muscle weakness (generalized) (M62.81)     Time: [id number] PT Time Calculation (min) (ACUTE ONLY): 17 min  Charges:  $Gait Training: 8-22 mins                    Verdene Lennert, PT, DPT  Acute Rehabilitation Ortho Tech Supervisor [phone] pager [phone]) [phone] office

## 2020-11-11 DIAGNOSIS — I63511 Cerebral infarction due to unspecified occlusion or stenosis of right middle cerebral artery: Secondary | ICD-10-CM | POA: Diagnosis not present

## 2020-11-11 DIAGNOSIS — I6521 Occlusion and stenosis of right carotid artery: Secondary | ICD-10-CM | POA: Diagnosis not present

## 2020-11-11 DIAGNOSIS — K219 Gastro-esophageal reflux disease without esophagitis: Secondary | ICD-10-CM | POA: Diagnosis not present

## 2020-11-11 DIAGNOSIS — I639 Cerebral infarction, unspecified: Secondary | ICD-10-CM | POA: Diagnosis not present

## 2020-11-11 LAB — SURGICAL PCR SCREEN
MRSA, PCR: NEGATIVE
Staphylococcus aureus: NEGATIVE

## 2020-11-11 LAB — CBC WITH DIFFERENTIAL/PLATELET
Abs Immature Granulocytes: 0.04 10*3/uL (ref 0.00–0.07)
Basophils Absolute: 0 10*3/uL (ref 0.0–0.1)
Basophils Relative: 0 %
Eosinophils Absolute: 0.3 10*3/uL (ref 0.0–0.5)
Eosinophils Relative: 4 %
HCT: 43 % (ref 36.0–46.0)
Hemoglobin: 14.3 g/dL (ref 12.0–15.0)
Immature Granulocytes: 1 %
Lymphocytes Relative: 16 %
Lymphs Abs: 1.2 10*3/uL (ref 0.7–4.0)
MCH: 29.4 pg (ref 26.0–34.0)
MCHC: 33.3 g/dL (ref 30.0–36.0)
MCV: 88.3 fL (ref 80.0–100.0)
Monocytes Absolute: 0.6 10*3/uL (ref 0.1–1.0)
Monocytes Relative: 8 %
Neutro Abs: 5.4 10*3/uL (ref 1.7–7.7)
Neutrophils Relative %: 71 %
Platelets: 188 10*3/uL (ref 150–400)
RBC: 4.87 MIL/uL (ref 3.87–5.11)
RDW: 12 % (ref 11.5–15.5)
WBC: 7.5 10*3/uL (ref 4.0–10.5)
nRBC: 0 % (ref 0.0–0.2)

## 2020-11-11 LAB — COMPREHENSIVE METABOLIC PANEL
ALT: 84 U/L — ABNORMAL HIGH (ref 0–44)
AST: 88 U/L — ABNORMAL HIGH (ref 15–41)
Albumin: 3.3 g/dL — ABNORMAL LOW (ref 3.5–5.0)
Alkaline Phosphatase: 82 U/L (ref 38–126)
Anion gap: 4 — ABNORMAL LOW (ref 5–15)
BUN: 21 mg/dL (ref 8–23)
CO2: 33 mmol/L — ABNORMAL HIGH (ref 22–32)
Calcium: 9.7 mg/dL (ref 8.9–10.3)
Chloride: 103 mmol/L (ref 98–111)
Creatinine, Ser: 0.95 mg/dL (ref 0.44–1.00)
GFR, Estimated: >60 mL/min (ref >60)
Glucose, Bld: 137 mg/dL — ABNORMAL HIGH (ref 70–99)
Potassium: 4.8 mmol/L (ref 3.5–5.1)
Sodium: 140 mmol/L (ref 135–145)
Total Bilirubin: 0.8 mg/dL (ref 0.3–1.2)
Total Protein: 6.1 g/dL — ABNORMAL LOW (ref 6.5–8.1)

## 2020-11-11 MED ORDER — BACITRACIN-NEOMYCIN-POLYMYXIN OINTMENT TUBE
TOPICAL_OINTMENT | Freq: Two times a day (BID) | CUTANEOUS | Status: DC
  Filled 2020-11-11 (×2): qty 14

## 2020-11-11 MED ORDER — LIDOCAINE 4 % EX CREA
TOPICAL_CREAM | Freq: Two times a day (BID) | CUTANEOUS | Status: DC | PRN
  Administered 2020-11-11: 1 via TOPICAL
  Filled 2020-11-11: qty 5

## 2020-11-11 MED ORDER — MUPIROCIN 2 % EX OINT
1.0000 "application " | TOPICAL_OINTMENT | Freq: Two times a day (BID) | CUTANEOUS | Status: DC
  Administered 2020-11-12: 1 via NASAL
  Filled 2020-11-11: qty 22

## 2020-11-11 MED ORDER — SODIUM CHLORIDE 0.9 % IV SOLN
1.5000 g | INTRAVENOUS | Status: AC
  Administered 2020-11-12: 1.5 g via INTRAVENOUS
  Filled 2020-11-11: qty 1.5

## 2020-11-11 MED ORDER — ROSUVASTATIN CALCIUM 20 MG PO TABS
20.0000 mg | ORAL_TABLET | Freq: Every day | ORAL | Status: DC
  Administered 2020-11-11 – 2020-11-13 (×3): 20 mg via ORAL
  Filled 2020-11-11 (×3): qty 1

## 2020-11-11 NOTE — Anesthesia Preprocedure Evaluation (Addendum)
Anesthesia Evaluation  Patient identified by MRN, date of birth, ID band Patient awake    Reviewed: Allergy & Precautions, NPO status , Patient's Chart, lab work & pertinent test results  Airway Mallampati: III  TM Distance: >3 FB Neck ROM: Full    Dental  (+) Edentulous Upper, Edentulous Lower   Pulmonary former smoker,    Pulmonary exam normal breath sounds clear to auscultation       Cardiovascular hypertension, Pt. on medications Normal cardiovascular exam Rhythm:Regular Rate:Normal  ECG: SR, rate 86  ECHO: 1. Left ventricular ejection fraction, by estimation, is 60 to 65%. The left ventricle has normal function. The left ventricle has no regional wall motion abnormalities. Left ventricular diastolic parameters were normal. 2. Right ventricular systolic function was not well visualized. The right ventricular size is normal. There is normal pulmonary artery systolic pressure. The estimated right ventricular systolic pressure is 99991111 mmHg. 3. The mitral valve is normal in structure. No evidence of mitral valve regurgitation. No evidence of mitral stenosis. 4. The aortic valve is tricuspid. Aortic valve regurgitation is trivial. Mild to moderate aortic valve sclerosis/calcification is present, without any evidence of aortic stenosis. 5. The inferior vena cava is normal in size with greater than 50% respiratory variability, suggesting right atrial pressure of 3 mmHg.   Neuro/Psych  Headaches, CVA (Left hand), Residual Symptoms negative psych ROS   GI/Hepatic Neg liver ROS, GERD  Medicated and Controlled,  Endo/Other  negative endocrine ROS  Renal/GU negative Renal ROS     Musculoskeletal  (+) Arthritis ,   Abdominal   Peds  Hematology negative hematology ROS (+)   Anesthesia Other Findings RIGHT CAROTID STENOSIS  Reproductive/Obstetrics                            Anesthesia  Physical Anesthesia Plan  ASA: 3  Anesthesia Plan: General   Post-op Pain Management:    Induction: Intravenous  PONV Risk Score and Plan: 3 and Ondansetron, Dexamethasone and Treatment may vary due to age or medical condition  Airway Management Planned: Oral ETT  Additional Equipment: Arterial line  Intra-op Plan:   Post-operative Plan: Extubation in OR  Informed Consent:   Plan Discussed with: CRNA  Anesthesia Plan Comments:        Anesthesia Quick Evaluation

## 2020-11-11 NOTE — Progress Notes (Signed)
Since grandson, who speaks [place] Fluently and fluidly, was in the room, I did take consent in the room for pt to sign and for grandson to interpret, as pt does speak and understand English but her dialect of [place] is not available on the translator language line.  Grandson said the doctor did talk to them today but he will be here in the morning with his mom (patient's daughter) and patient will sign the consent with her.

## 2020-11-11 NOTE — Plan of Care (Signed)
  Problem: Education: Goal: Knowledge of disease or condition will improve Outcome: Progressing Goal: Knowledge of secondary prevention will improve Outcome: Progressing Goal: Knowledge of patient specific risk factors addressed and post discharge goals established will improve Outcome: Progressing   Problem: Coping: Goal: Will identify appropriate support needs Outcome: Progressing   Problem: Self-Care: Goal: Ability to participate in self-care as condition permits will improve Outcome: Progressing Goal: Verbalization of feelings and concerns over difficulty with self-care will improve Outcome: Progressing   Problem: Education: Goal: Knowledge of General Education information will improve Description: Including pain rating scale, medication(s)/side effects and non-pharmacologic comfort measures Outcome: Progressing   Problem: Health Behavior/Discharge Planning: Goal: Ability to manage health-related needs will improve Outcome: Progressing   Problem: Clinical Measurements: Goal: Ability to maintain clinical measurements within normal limits will improve Outcome: Progressing Goal: Will remain free from infection Outcome: Progressing Goal: Diagnostic test results will improve Outcome: Progressing Goal: Respiratory complications will improve Outcome: Progressing Goal: Cardiovascular complication will be avoided Outcome: Progressing   Problem: Activity: Goal: Risk for activity intolerance will decrease Outcome: Progressing   Problem: Nutrition: Goal: Adequate nutrition will be maintained Outcome: Progressing   Problem: Coping: Goal: Level of anxiety will decrease Outcome: Progressing   Problem: Elimination: Goal: Will not experience complications related to bowel motility Outcome: Progressing Goal: Will not experience complications related to urinary retention Outcome: Progressing   Problem: Pain Managment: Goal: General experience of comfort will  improve Outcome: Progressing   Problem: Safety: Goal: Ability to remain free from injury will improve Outcome: Progressing   Problem: Skin Integrity: Goal: Risk for impaired skin integrity will decrease Outcome: Progressing

## 2020-11-11 NOTE — Progress Notes (Addendum)
[facility] PROGRESS NOTE  ASSESSMENT / PLAN: [name] is a 75 y.o. female with symptomatic right carotid artery stenosis. Plan R TCAR tomorrow. Daughter updated by telephone. We reviewed the risks / benefits / alternatives. She is understanding and wishes to proceed. Continue ASA / Plavix / Statin.   SUBJECTIVE: No interval changes. History limited by language barrier.   OBJECTIVE: BP (!) 147/82 (BP Location: Left Arm)   Pulse 79   Temp 98.4 F (36.9 C) (Oral)   Resp 14   Ht 4' 11.5" (1.511 m)   Wt 45.5 kg   SpO2 97%   BMI 19.92 kg/m   Intake/Output Summary (Last 24 hours) at 11/11/2020 1250 Last data filed at 11/11/2020 0900 Gross per 24 hour  Intake 480 ml  Output --  Net 480 ml    No distress LUE 4/5. Unchanged.  CBC Latest Ref Rng & Units 11/11/2020 11/10/2020 11/09/2020  WBC 4.0 - 10.5 K/uL 7.5 5.4 5.4  Hemoglobin 12.0 - 15.0 g/dL 14.3 14.4 14.1  Hematocrit 36.0 - 46.0 % 43.0 43.3 41.3  Platelets 150 - 400 K/uL 188 188 177     CMP Latest Ref Rng & Units 11/11/2020 11/10/2020 11/09/2020  Glucose 70 - 99 mg/dL 137(H) 112(H) 133(H)  BUN 8 - 23 mg/dL 21 16 27(H)  Creatinine 0.44 - 1.00 mg/dL 0.95 0.69 0.81  Sodium 135 - 145 mmol/L 140 142 141  Potassium 3.5 - 5.1 mmol/L 4.8 4.0 4.0  Chloride 98 - 111 mmol/L 103 103 107  CO2 22 - 32 mmol/L 33(H) 26 25  Calcium 8.9 - 10.3 mg/dL 9.7 9.8 9.4  Total Protein 6.5 - 8.1 g/dL 6.1(L) 6.2(L) 5.7(L)  Total Bilirubin 0.3 - 1.2 mg/dL 0.8 0.9 0.8  Alkaline Phos 38 - 126 U/L 82 70 69  AST 15 - 41 U/L 88(H) 72(H) 65(H)  ALT 0 - 44 U/L 84(H) 69(H) 48(H)    Estimated Creatinine Clearance: 37 mL/min (by C-G formula based on SCr of 0.95 mg/dL).  [name]. Stanford Breed, MD [facility] Phone Number: [phone] 11/11/2020 12:50 PM

## 2020-11-11 NOTE — Progress Notes (Signed)
PROGRESS NOTE                                                                                                                                                                                                             Patient Demographics:    [name], is a 75 y.o. female, DOB - 1945/09/10, TKW:[id number]  Outpatient Primary MD for the patient is Lavone Orn, MD    LOS - 6  Admit date - 11/05/2020    Chief Complaint  Patient presents with   Abnormal Lab       Brief Narrative (HPI from H&P) - [name] is a 75 y.o. female with a known history of hypertension, GERD, vertigo was in a usual state of health until 1 month ago when she developed sudden onset of headache associated with left arm weakness and bilateral lower extremity weakness, work-up showed right MCA CVA.  Was found to have critical right-sided internal carotid artery stenosis due for surgical correction on 11/12/2020   Subjective:   In bed appears to be in no distress denies any headache chest or abdominal pain, left-sided weakness persists in the left upper extremity, leg much improved.   Assessment  & Plan :     Left-sided weakness arm more than leg due to acute Right MCA stroke.  With evidence of right ICA significant stenosis greater than 80%, stroke team following she is on dual antiplatelet therapy and high intensity statin will stroke work-up underway, LDL was above goal, A1c was stable, able echocardiogram with preserved EF of 65%.  DW VVS and  CVA team Dr Leonie Man 11/07/2020, will try to keep systolic blood pressure untreated around 150.  She underwent a repeat MRI on 11/09/2020 which is stable.  Case discussed with vascular surgery likely carotid surgery on 11/12/2020 patient and daughter informed clearly.  2.  Dyslipidemia.  Placed on high intensity statin, switched from Lipitor to Crestor on 11/11/2020 due to rising LFTs.  3.  R. ICA significant  stenosis with right MCA stroke.  On dual antiplatelet therapy and statin, vascular surgery consulted for possible surgery on 11/12/2020.   4. HTN - permissive due  to Acute CVA, trying to keep systolic around 970 as she has significant critical stenosis in right ICA with waxing waning left-sided weakness.  5.  Mild asymptomatic transaminitis.  Likely due to statin, switched to Crestor on 11/11/2020, remains symptom-free and RUQ ultrasound is stable as well.  Hepatic Function Latest Ref Rng & Units 11/11/2020 11/10/2020 11/09/2020  Total Protein 6.5 - 8.1 g/dL 6.1(L) 6.2(L) 5.7(L)  Albumin 3.5 - 5.0 g/dL 3.3(L) 3.5 3.2(L)  AST 15 - 41 U/L 88(H) 72(H) 65(H)  ALT 0 - 44 U/L 84(H) 69(H) 48(H)  Alk Phosphatase 38 - 126 U/L 82 70 69  Total Bilirubin 0.3 - 1.2 mg/dL 0.8 0.9 0.8  Bilirubin, Direct 0.0 - 0.3 mg/dL - - -        Condition - Extremely Guarded  Family Communication  :  grand son bedside 11/07/20, daughter Prudence Davidson [phone] on 11/10/20  Code Status :  Full  Consults  :  Neuro, VVS  PUD Prophylaxis : PPI   Procedures  :     MRI x 2 - 1. Subacute right MCA infarcts. 2. Mild chronic small vessel ischemic disease  CTA - 1. No intracranial arterial occlusion or high-grade stenosis. 2. 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 Aortic Atherosclerosis (ICD10-I70.0).   Carotid US - 1. No intracranial arterial occlusion or high-grade stenosis. 2. 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 Aortic Atherosclerosis.  TTE -  1. Left ventricular ejection fraction, by estimation, is 60 to 65%. The left ventricle has normal function. The left ventricle has no regional wall motion abnormalities. Left ventricular diastolic parameters were normal.  2. Right ventricular systolic function was not well visualized. The right ventricular size is normal. There is normal pulmonary artery  systolic pressure. The estimated right ventricular systolic pressure is 27.7 mmHg.  3. The mitral valve is normal in structure. No evidence of mitral valve regurgitation. No evidence of mitral stenosis.  4. The aortic valve is tricuspid. Aortic valve regurgitation is trivial. Mild to moderate aortic valve sclerosis/calcification is present, without any evidence of aortic stenosis.  5. The inferior vena cava is normal in size with greater than 50% respiratory variability, suggesting right atrial pressure of 3 mmHg.      Disposition Plan  :    Status is: Inpatient  Remains inpatient appropriate because:IV treatments appropriate due to intensity of illness or inability to take PO  Dispo: The patient is from: Home              Anticipated d/c is to: Home              Patient currently is not medically stable to d/c.   Difficult to place patient No  DVT Prophylaxis  :    enoxaparin (LOVENOX) injection 30 mg Start: 11/05/20 2200 SCD's Start: 11/05/20 1955     Lab Results  Component Value Date   PLT 188 11/11/2020    Diet :  Diet Order             Diet NPO time specified Except for: Sips with Meds  Diet effective midnight           Diet regular Room service appropriate? Yes; Fluid consistency: Thin  Diet effective now                    Inpatient Medications  Scheduled Meds:   stroke: mapping our early stages of  recovery book   Does not apply Once   aspirin EC  81 mg Oral Daily   clopidogrel  75 mg Oral Daily   enoxaparin (LOVENOX) injection  30 mg Subcutaneous Q24H   feeding supplement  237 mL Oral BID BM   multivitamin with minerals  1 tablet Oral Daily   pantoprazole  40 mg Oral q AM   rosuvastatin  20 mg Oral Daily   Continuous Infusions:  [START ON 11/12/2020] cefUROXime (ZINACEF)  IV      PRN Meds:.[DISCONTINUED] acetaminophen **OR** [DISCONTINUED] acetaminophen (TYLENOL) oral liquid 160 mg/5 mL **OR** acetaminophen, acetaminophen, meclizine,  senna-docusate  Antibiotics  :    Anti-infectives (From admission, onward)    Start     Dose/Rate Route Frequency Ordered Stop   11/12/20 0700  cefUROXime (ZINACEF) 1.5 g in sodium chloride 0.9 % 100 mL IVPB        1.5 g 200 mL/hr over 30 Minutes Intravenous On call to O.R. 11/11/20 0932 11/13/20 0559        Time Spent in minutes  30   Lala Lund M.D on 11/11/2020 at 10:30 AM  To page go to [email or web address]   [facility] -  Office  [phone]    See all Orders from today for further details    Objective:   Vitals:   11/10/20 1945 11/10/20 2355 11/11/20 0329 11/11/20 0815  BP: (!) 161/76 135/70 133/70 (!) 141/71  Pulse: 88 83 81 89  Resp: '18 15 16 14  ' Temp: 98.4 F (36.9 C) 98 F (36.7 C) 98.1 F (36.7 C) 98 F (36.7 C)  TempSrc: Oral Oral Oral Oral  SpO2: 97% 96% 97% 94%  Weight:      Height:        Wt Readings from Last 3 Encounters:  11/06/20 45.5 kg  07/10/19 45.5 kg  07/11/18 43.5 kg     Intake/Output Summary (Last 24 hours) at 11/11/2020 1030 Last data filed at 11/11/2020 0900 Gross per 24 hour  Intake 480 ml  Output --  Net 480 ml      Physical Exam  Awake Alert, No new F.N deficits, continues to have some left-sided weakness arm more than leg, arm 3-4/5 [place].AT,PERRAL Supple Neck,No JVD, No cervical lymphadenopathy appriciated.  Symmetrical Chest wall movement, Good air movement bilaterally, CTAB RRR,No Gallops, Rubs or new Murmurs, No Parasternal Heave +ve B.Sounds, Abd Soft, No tenderness, No organomegaly appriciated, No rebound - guarding or rigidity. No Cyanosis, Clubbing or edema, No new Rash or bruise      Data Review:    CBC Recent Labs  Lab 11/05/20 1731 11/08/20 0330 11/09/20 0136 11/10/20 0408 11/11/20 0026  WBC 8.2 5.8 5.4 5.4 7.5  HGB 15.4* 14.8 14.1 14.4 14.3  HCT 45.0 42.9 41.3 43.3 43.0  PLT 213 211 177 188 188  MCV 86.4 85.1 86.0 87.1 88.3  MCH 29.6 29.4 29.4 29.0 29.4  MCHC 34.2 34.5 34.1 33.3 33.3   RDW 12.1 12.2 12.1 12.2 12.0  LYMPHSABS 1.4 1.4 1.3 1.1 1.2  MONOABS 0.6 0.4 0.5 0.4 0.6  EOSABS 0.1 0.3 0.3 0.3 0.3  BASOSABS 0.0 0.0 0.0 0.0 0.0    Recent Labs  Lab 11/05/20 1731 11/06/20 0500 11/07/20 1104 11/08/20 0330 11/09/20 0136 11/10/20 0408 11/11/20 0026  NA 142  --  140 140 141 142 140  K 3.4*  --  4.4 3.8 4.0 4.0 4.8  CL 108  --  107 106 107 103 103  CO2 22  --  '24 26 25 26 ' 33*  GLUCOSE 125*  --  113* 116* 133* 112* 137*  BUN 15  --  25* 29* 27* 16 21  CREATININE 0.67  --  0.83 0.97 0.81 0.69 0.95  CALCIUM 9.5  --  9.5 10.0 9.4 9.8 9.7  AST 23  --   --  28 65* 72* 88*  ALT 20  --   --  26 48* 69* 84*  ALKPHOS 73  --   --  70 69 70 82  BILITOT 0.9  --   --  0.8 0.8 0.9 0.8  ALBUMIN 4.6  --   --  3.4* 3.2* 3.5 3.3*  MG  --   --  1.8 1.8 1.8 1.8  --   INR 1.0  --  1.0  --   --   --   --   HGBA1C  --  6.0*  --   --   --   --   --     ------------------------------------------------------------------------------------------------------------------ No results for input(s): CHOL, HDL, LDLCALC, TRIG, CHOLHDL, LDLDIRECT in the last 72 hours.   Lab Results  Component Value Date   HGBA1C 6.0 (H) 11/06/2020   ------------------------------------------------------------------------------------------------------------------ No results for input(s): TSH, T4TOTAL, T3FREE, THYROIDAB in the last 72 hours.  Invalid input(s): FREET3  Cardiac Enzymes No results for input(s): CKMB, TROPONINI, MYOGLOBIN in the last 168 hours.  Invalid input(s): CK ------------------------------------------------------------------------------------------------------------------ No results found for: BNP   Radiology Reports CT ANGIO HEAD NECK W WO CM  Result Date: 11/05/2020 CLINICAL DATA:  Ataxia, stroke suspected EXAM: CT ANGIOGRAPHY HEAD AND NECK TECHNIQUE: Multidetector CT imaging of the head and neck was performed using the standard protocol during bolus administration of  intravenous contrast. Multiplanar CT image reconstructions and MIPs were obtained to evaluate the vascular anatomy. Carotid stenosis measurements (when applicable) are obtained utilizing NASCET criteria, using the distal internal carotid diameter as the denominator. CONTRAST:  24m OMNIPAQUE IOHEXOL 350 MG/ML SOLN COMPARISON:  None. FINDINGS: CT HEAD FINDINGS Brain: There is no mass, hemorrhage or extra-axial collection. The size and configuration of the ventricles and extra-axial CSF spaces are normal. There is no acute or chronic infarction. The brain parenchyma is normal. Skull: The visualized skull base, calvarium and extracranial soft tissues are normal. Sinuses/Orbits: No fluid levels or advanced mucosal thickening of the visualized paranasal sinuses. No mastoid or middle ear effusion. The orbits are normal. CTA NECK FINDINGS SKELETON: There is no bony spinal canal stenosis. No lytic or blastic lesion. OTHER NECK: Normal pharynx, larynx and major salivary glands. No cervical lymphadenopathy. Unremarkable thyroid gland. UPPER CHEST: No pneumothorax or pleural effusion. No nodules or masses. AORTIC ARCH: There is calcific atherosclerosis of the aortic arch. There is no aneurysm, dissection or hemodynamically significant stenosis of the visualized portion of the aorta. Conventional 3 vessel aortic branching pattern. The visualized proximal subclavian arteries are widely patent. RIGHT CAROTID SYSTEM: No dissection, occlusion or aneurysm. There is mixed density atherosclerosis extending into the proximal ICA, resulting in 80% stenosis. LEFT CAROTID SYSTEM: Normal without aneurysm, dissection or stenosis. VERTEBRAL ARTERIES: Left dominant configuration. Both origins are clearly patent. There is no dissection, occlusion or flow-limiting stenosis to the skull base (V1-V3 segments). CTA HEAD FINDINGS POSTERIOR CIRCULATION: --Vertebral arteries: Normal V4 segments. --Inferior cerebellar arteries: Normal. --Basilar  artery: Normal. --Superior cerebellar arteries: Normal. --Posterior cerebral arteries (PCA): Normal. ANTERIOR CIRCULATION: --Intracranial internal carotid arteries: Normal. --Anterior cerebral arteries (ACA): Normal. Both A1 segments are present. Patent anterior communicating artery (a-comm). --Middle cerebral  arteries (MCA): Normal. VENOUS SINUSES: As permitted by contrast timing, patent. ANATOMIC VARIANTS: None Review of the MIP images confirms the above findings. IMPRESSION: 1. No intracranial arterial occlusion or high-grade stenosis. 2. 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 Aortic Atherosclerosis (ICD10-I70.0). Electronically Signed   By: Ulyses Jarred M.D.   On: 11/05/2020 19:58   DG Chest 2 View  Result Date: 11/05/2020 CLINICAL DATA:  Stroke, left arm weakness EXAM: CHEST - 2 VIEW COMPARISON:  03/24/2015 FINDINGS: The heart size and mediastinal contours are within normal limits. Both lungs are clear. The visualized skeletal structures are unremarkable. IMPRESSION: No active cardiopulmonary disease. Electronically Signed   By: Fidela Salisbury MD   On: 11/05/2020 20:32   MR BRAIN WO CONTRAST  Result Date: 11/09/2020 CLINICAL DATA:  Stroke follow-up. EXAM: MRI HEAD WITHOUT CONTRAST TECHNIQUE: Multiplanar, multiecho pulse sequences of the brain and surrounding structures were obtained without intravenous contrast. COMPARISON:  11/05/2020 FINDINGS: Brain: Limited sequence exam per provider request, only diffusion and gradient imaging. Fading patchy restricted diffusion in the high right cerebral white matter and frontal parietal cortex. No acute hemorrhage or acute infarct. No hydrocephalus or collection. Vascular: No T2 weighted imaging Skull and upper cervical spine: Unremarkable Sinuses/Orbits: Unremarkable IMPRESSION: No acute or progressive infarct.  No interval hemorrhage. Electronically Signed   By: Monte Fantasia M.D.   On:  11/09/2020 10:57   MR BRAIN W WO CONTRAST  Result Date: 11/05/2020 CLINICAL DATA:  Essential hypertension.  Headache. EXAM: MRI HEAD WITHOUT AND WITH CONTRAST TECHNIQUE: Multiplanar, multiecho pulse sequences of the brain and surrounding structures were obtained without and with intravenous contrast. CONTRAST:  28m MULTIHANCE GADOBENATE DIMEGLUMINE 529 MG/ML IV SOLN COMPARISON:  06/09/2008 FINDINGS: Brain: There are multiple small regions of diffusion weighted signal abnormality in the right frontal and parietal lobes involving white matter of the centrum semiovale as well as cortex and subcortical white matter towards the vertex compatible with subacute infarcts (MCA and deep watershed territory). There is associated gyriform enhancement most notable in the parietal lobe where there is also a small focus of petechial hemorrhage. Small T2 hyperintensities elsewhere in the cerebral white matter bilaterally are nonspecific but compatible with mild chronic small vessel ischemic disease. There is mild cerebral atrophy. No mass, midline shift, or extra-axial fluid collection is identified. Vascular: Major intracranial vascular flow voids are preserved. Skull and upper cervical spine: Unremarkable bone marrow signal. Sinuses/Orbits: Bilateral cataract extraction. Paranasal sinuses and mastoid air cells are clear. Other: None. IMPRESSION: 1. Subacute right MCA infarcts. 2. Mild chronic small vessel ischemic disease. Electronically Signed   By: ALogan BoresM.D.   On: 11/05/2020 13:36   ECHOCARDIOGRAM COMPLETE  Result Date: 11/08/2020    ECHOCARDIOGRAM REPORT   Patient Name:   [facility] IncDLake [facility]ichDate of Exam: 11/08/2020 Medical Rec #:  [id number]           Height:       59.5 in Accession #:    [id number]          Weight:       100.3 lb Date of Birth:  1June 08, 1947          BSA:          1.384 m Patient Age:    89years             BP:           134/80 mmHg Patient Gender: F  HR:           84  bpm. Exam Location:  Inpatient Procedure: 2D Echo, Cardiac Doppler and Color Doppler Indications:    Stroke  History:        Patient has prior history of Echocardiogram examinations, most                 recent 04/07/2018. Risk Factors:Hypertension and Dyslipidemia.                 GERD.  Sonographer:    Clayton Lefort RDCS (AE) Referring Phys: [id number] ALEXIS HUGELMEYER  Sonographer Comments: Technically difficult study due to poor echo windows. IMPRESSIONS  1. Left ventricular ejection fraction, by estimation, is 60 to 65%. The left ventricle has normal function. The left ventricle has no regional wall motion abnormalities. Left ventricular diastolic parameters were normal.  2. Right ventricular systolic function was not well visualized. The right ventricular size is normal. There is normal pulmonary artery systolic pressure. The estimated right ventricular systolic pressure is 62.5 mmHg.  3. The mitral valve is normal in structure. No evidence of mitral valve regurgitation. No evidence of mitral stenosis.  4. The aortic valve is tricuspid. Aortic valve regurgitation is trivial. Mild to moderate aortic valve sclerosis/calcification is present, without any evidence of aortic stenosis.  5. The inferior vena cava is normal in size with greater than 50% respiratory variability, suggesting right atrial pressure of 3 mmHg. FINDINGS  Left Ventricle: Left ventricular ejection fraction, by estimation, is 60 to 65%. The left ventricle has normal function. The left ventricle has no regional wall motion abnormalities. The left ventricular internal cavity size was normal in size. There is  no left ventricular hypertrophy. Left ventricular diastolic parameters were normal. Normal left ventricular filling pressure. Right Ventricle: The right ventricular size is normal. No increase in right ventricular wall thickness. Right ventricular systolic function was not well visualized. There is normal pulmonary artery systolic pressure. The  tricuspid regurgitant velocity is  2.63 m/s, and with an assumed right atrial pressure of 3 mmHg, the estimated right ventricular systolic pressure is 63.8 mmHg. Left Atrium: Left atrial size was normal in size. Right Atrium: Right atrial size was normal in size. Pericardium: There is no evidence of pericardial effusion. Presence of pericardial fat pad. Mitral Valve: The mitral valve is normal in structure. No evidence of mitral valve regurgitation. No evidence of mitral valve stenosis. Tricuspid Valve: The tricuspid valve is normal in structure. Tricuspid valve regurgitation is mild . No evidence of tricuspid stenosis. Aortic Valve: The aortic valve is tricuspid. Aortic valve regurgitation is trivial. Mild to moderate aortic valve sclerosis/calcification is present, without any evidence of aortic stenosis. Aortic valve mean gradient measures 2.0 mmHg. Aortic valve peak  gradient measures 3.7 mmHg. Aortic valve area, by VTI measures 1.82 cm. Pulmonic Valve: The pulmonic valve was normal in structure. Pulmonic valve regurgitation is not visualized. No evidence of pulmonic stenosis. Aorta: The aortic root is normal in size and structure. Venous: The inferior vena cava is normal in size with greater than 50% respiratory variability, suggesting right atrial pressure of 3 mmHg. IAS/Shunts: There is redundancy of the interatrial septum. The interatrial septum appears to be lipomatous. No atrial level shunt detected by color flow Doppler.  LEFT VENTRICLE PLAX 2D LVIDd:         3.00 cm  Diastology LVIDs:         1.90 cm  LV e' medial:    9.03 cm/s LV PW:  1.20 cm  LV E/e' medial:  8.9 LV IVS:        1.10 cm  LV e' lateral:   9.36 cm/s LVOT diam:     1.90 cm  LV E/e' lateral: 8.6 LV SV:         31 LV SV Index:   23 LVOT Area:     2.84 cm  RIGHT VENTRICLE             IVC RV S prime:     13.60 cm/s  IVC diam: 1.20 cm TAPSE (M-mode): 1.3 cm LEFT ATRIUM           Index       RIGHT ATRIUM          Index LA diam:       1.70 cm 1.23 cm/m  RA Area:     8.88 cm LA Vol (A4C): 16.6 ml 12.00 ml/m RA Volume:   18.50 ml 13.37 ml/m  AORTIC VALVE AV Area (Vmax):    1.89 cm AV Area (Vmean):   1.90 cm AV Area (VTI):     1.82 cm AV Vmax:           95.80 cm/s AV Vmean:          69.300 cm/s AV VTI:            0.173 m AV Peak Grad:      3.7 mmHg AV Mean Grad:      2.0 mmHg LVOT Vmax:         63.80 cm/s LVOT Vmean:        46.400 cm/s LVOT VTI:          0.111 m LVOT/AV VTI ratio: 0.64  AORTA Ao Root diam: 3.00 cm Ao Asc diam:  2.80 cm MITRAL VALVE               TRICUSPID VALVE MV Area (PHT): 7.66 cm    TR Peak grad:   27.7 mmHg MV Decel Time: 99 msec     TR Vmax:        263.00 cm/s MV E velocity: 80.70 cm/s MV A velocity: 88.00 cm/s  SHUNTS MV E/A ratio:  0.92        Systemic VTI:  0.11 m                            Systemic Diam: 1.90 cm Fransico Him MD Electronically signed by Fransico Him MD Signature Date/Time: 11/08/2020/11:36:13 AM    Final    VAS US CAROTID (at [facility] and [facility] only)  Result Date: 11/06/2020 Carotid Arterial Duplex Study Patient Name:  TAHJAE DURR Davison  Date of Exam:   11/06/2020 Medical Rec #: [id number]             Accession #:    [id number] Date of Birth: Jun 09, 1945            Patient Gender: F Patient Age:   74Y Exam Location:  [facility] Procedure:      VAS US CAROTID Referring Phys: [id number] [place] --------------------------------------------------------------------------------  Indications:       CVA. Risk Factors:      None. Comparison Study:  11/05/2020 - CT ANGIO HEAD NECK W WO CM                    IMPRESSION:  1. No intracranial arterial occlusion or high-grade stenosis.                    2. 80% stenosis of the proximal right internal carotid artery                    secondary to mixed density atherosclerosis. Performing Technologist: Oliver Hum RVT  Examination Guidelines: A complete evaluation includes B-mode imaging, spectral Doppler, color Doppler, and power  Doppler as needed of all accessible portions of each vessel. Bilateral testing is considered an integral part of a complete examination. Limited examinations for reoccurring indications may be performed as noted.  Right Carotid Findings: +----------+--------+--------+--------+--------------------------+--------+           PSV cm/sEDV cm/sStenosisPlaque Description        Comments +----------+--------+--------+--------+--------------------------+--------+ CCA Prox  98      16              smooth and heterogenous            +----------+--------+--------+--------+--------------------------+--------+ CCA Distal39      10              smooth and heterogenous            +----------+--------+--------+--------+--------------------------+--------+ ICA Prox  374     162     80-99%  irregular and heterogenous         +----------+--------+--------+--------+--------------------------+--------+ ICA Mid   110     28              smooth and heterogenous            +----------+--------+--------+--------+--------------------------+--------+ ICA Distal62      22                                        tortuous +----------+--------+--------+--------+--------------------------+--------+ ECA       116     9                                                  +----------+--------+--------+--------+--------------------------+--------+ +----------+--------+-------+--------+-------------------+           PSV cm/sEDV cmsDescribeArm Pressure (mmHG) +----------+--------+-------+--------+-------------------+ [id number]                                         +----------+--------+-------+--------+-------------------+ +---------+--------+--+--------+--+---------+ VertebralPSV cm/s40EDV cm/s12Antegrade +---------+--------+--+--------+--+---------+  Left Carotid Findings: +----------+--------+--------+--------+-----------------------+--------+           PSV cm/sEDV  cm/sStenosisPlaque Description     Comments +----------+--------+--------+--------+-----------------------+--------+ CCA Prox  70      14              smooth and heterogenous         +----------+--------+--------+--------+-----------------------+--------+ CCA Distal47      14              smooth and heterogenous         +----------+--------+--------+--------+-----------------------+--------+ ICA Prox  49      17              smooth and heterogenous         +----------+--------+--------+--------+-----------------------+--------+ ICA Distal68      24  tortuous +----------+--------+--------+--------+-----------------------+--------+ ECA       60      6                                               +----------+--------+--------+--------+-----------------------+--------+ +----------+--------+--------+--------+-------------------+           PSV cm/sEDV cm/sDescribeArm Pressure (mmHG) +----------+--------+--------+--------+-------------------+ [id number]                                         +----------+--------+--------+--------+-------------------+ +---------+--------+--+--------+--+---------+ VertebralPSV cm/s40EDV cm/s13Antegrade +---------+--------+--+--------+--+---------+   Summary: Right Carotid: Velocities in the right ICA are consistent with a 80-99%                stenosis. Left Carotid: Velocities in the left ICA are consistent with a 1-39% stenosis. Vertebrals: Bilateral vertebral arteries demonstrate antegrade flow. *See table(s) above for measurements and observations.  Electronically signed by Antony Contras MD on 11/06/2020 at 1:00:26 PM.    Final    US Abdomen Limited RUQ (LIVER/GB)  Result Date: 11/10/2020 CLINICAL DATA:  Transaminitis. EXAM: ULTRASOUND ABDOMEN LIMITED RIGHT UPPER QUADRANT COMPARISON:  March 05, 2014. FINDINGS: Gallbladder: Status post cholecystectomy. Common bile duct: Diameter: 8 mm  which is within normal limits given post cholecystectomy status. Liver: No focal lesion identified. Within normal limits in parenchymal echogenicity. Portal vein is patent on color Doppler imaging with normal direction of blood flow towards the liver. Other: None. IMPRESSION: Status post cholecystectomy. No other abnormality seen in the right upper quadrant of the abdomen. Electronically Signed   By: Marijo Conception M.D.   On: 11/10/2020 10:57

## 2020-11-11 NOTE — Progress Notes (Signed)
Physical Therapy Treatment Patient Details Name: [name] MRN: [id number] DOB: Aug 04, 1945 Today's Date: 11/11/2020    History of Present Illness 75 yo female presenting 7/20 after outpatient MRI revealed multiple subacute strokes. The pt reports she has been experiencing dizziness and weakness in LUE and bilateral LE for ~1 month prior to [place]. Work up revealed R MCA CVA, right 23 - 99 % carotid artery stenosis with planned revascularization sx 11/17/20. PMH includes: vertigo, HTN, and GERD.    PT Comments    Pt continues to improve with mobility. Currently she is doing as well with ambulation without the walker especially in her room where she can use the furniture to provide stability and reassurance. Likely at home she will function well with "furniture" walking. Next visit would like to try rollator for possible community use. Pt for surgery tomorrow.    Follow Up Recommendations  Home health PT;Supervision for mobility/OOB     Equipment Recommendations  Other (comment) (possibly rollator for community)    Recommendations for Other Services       Precautions / Restrictions Precautions Precautions: Fall Restrictions Weight Bearing Restrictions: No    Mobility  Bed Mobility Overal bed mobility: Needs Assistance Bed Mobility: Supine to Sit     Supine to sit: Modified independent (Device/Increase time)          Transfers Overall transfer level: Needs assistance Equipment used: None Transfers: Sit to/from Stand Sit to Stand: Supervision;Min guard         General transfer comment: Initially min guard for safety and then supervision from commode  Ambulation/Gait Ambulation/Gait assistance: Min guard Gait Distance (Feet): 200 Feet Assistive device: None;1 person hand held assist;Rolling walker (2 wheeled) Gait Pattern/deviations: Step-through pattern;Decreased step length - left Gait velocity: decr Gait velocity interpretation: <1.31 ft/sec,  indicative of household ambulator General Gait Details: Began amb with hand held assist for safety and then progressed to no support with occasional touching of furniture for reassurance. No loss of balance. Used rolling walker for short distance but didn't seem to improve gait and was more of a hindrance.   Stairs             Wheelchair Mobility    Modified Rankin (Stroke Patients Only) Modified Rankin (Stroke Patients Only) Pre-Morbid Rankin Score: Moderate disability Modified Rankin: Moderately severe disability     Balance Overall balance assessment: Needs assistance Sitting-balance support: Feet supported;No upper extremity supported Sitting balance-Leahy Scale: Good     Standing balance support: No upper extremity supported Standing balance-Leahy Scale: Fair                              Cognition Arousal/Alertness: Awake/alert Behavior During Therapy: WFL for tasks assessed/performed Overall Cognitive Status: Difficult to assess                                 General Comments: Appears to be [facility]. broken english, no interpreter available at this time.  Pt following commands and able to tell me what to order her for lunch. Pt able to describe that she would like to be able to go to the bathroom on her own so she doesn't have to call anyone but knows the bed/chair alarm will go off.      Exercises      General Comments        Pertinent Vitals/Pain Pain Assessment:  No/denies pain    Home Living                      Prior Function            PT Goals (current goals can now be found in the care plan section) Acute Rehab PT Goals Patient Stated Goal: to get better Progress towards PT goals: Progressing toward goals    Frequency    Min 4X/week      PT Plan Current plan remains appropriate    Co-evaluation              AM-PAC PT "6 Clicks" Mobility   Outcome Measure  Help needed turning from your back to  your side while in a flat bed without using bedrails?: None Help needed moving from lying on your back to sitting on the side of a flat bed without using bedrails?: None Help needed moving to and from a bed to a chair (including a wheelchair)?: A Little Help needed standing up from a chair using your arms (e.g., wheelchair or bedside chair)?: A Little Help needed to walk in hospital room?: A Little Help needed climbing 3-5 steps with a railing? : A Little 6 Click Score: 20    End of Session Equipment Utilized During Treatment: Gait belt Activity Tolerance: Patient tolerated treatment well Patient left: with call bell/phone within reach;in chair;with chair alarm set (seated EOB awaiting her dinner.) Nurse Communication: Mobility status PT Visit Diagnosis: Unsteadiness on feet (R26.81);Muscle weakness (generalized) (M62.81)     Time: :281048 PT Time Calculation (min) (ACUTE ONLY): 19 min  Charges:  $Gait Training: 8-22 mins                     [facility] Pager [phone] Office [place] 11/11/2020, 11:26 AM

## 2020-11-12 ENCOUNTER — Inpatient Hospital Stay (HOSPITAL_COMMUNITY): Payer: Medicare Other | Admitting: Anesthesiology

## 2020-11-12 ENCOUNTER — Inpatient Hospital Stay (HOSPITAL_COMMUNITY): Payer: Medicare Other

## 2020-11-12 ENCOUNTER — Encounter (HOSPITAL_COMMUNITY)
Admission: EM | Disposition: A | Discharge status: Home Health | Payer: Self-pay | Source: Home / Self Care | Attending: Internal Medicine

## 2020-11-12 ENCOUNTER — Encounter (HOSPITAL_COMMUNITY): Payer: Self-pay | Admitting: Family Medicine

## 2020-11-12 DIAGNOSIS — I63231 Cerebral infarction due to unspecified occlusion or stenosis of right carotid arteries: Secondary | ICD-10-CM | POA: Diagnosis not present

## 2020-11-12 DIAGNOSIS — Z006 Encounter for examination for normal comparison and control in clinical research program: Secondary | ICD-10-CM | POA: Diagnosis not present

## 2020-11-12 DIAGNOSIS — Z20822 Contact with and (suspected) exposure to covid-19: Secondary | ICD-10-CM | POA: Diagnosis not present

## 2020-11-12 DIAGNOSIS — I6521 Occlusion and stenosis of right carotid artery: Secondary | ICD-10-CM

## 2020-11-12 DIAGNOSIS — E43 Unspecified severe protein-calorie malnutrition: Secondary | ICD-10-CM | POA: Diagnosis not present

## 2020-11-12 DIAGNOSIS — I63511 Cerebral infarction due to unspecified occlusion or stenosis of right middle cerebral artery: Secondary | ICD-10-CM | POA: Diagnosis not present

## 2020-11-12 HISTORY — PX: TRANSCAROTID ARTERY REVASCULARIZATIONÂ: SHX6778

## 2020-11-12 LAB — COMPREHENSIVE METABOLIC PANEL
ALT: 175 U/L — ABNORMAL HIGH (ref 0–44)
AST: 172 U/L — ABNORMAL HIGH (ref 15–41)
Albumin: 3.3 g/dL — ABNORMAL LOW (ref 3.5–5.0)
Alkaline Phosphatase: 153 U/L — ABNORMAL HIGH (ref 38–126)
Anion gap: 4 — ABNORMAL LOW (ref 5–15)
BUN: 24 mg/dL — ABNORMAL HIGH (ref 8–23)
CO2: 29 mmol/L (ref 22–32)
Calcium: 9.2 mg/dL (ref 8.9–10.3)
Chloride: 104 mmol/L (ref 98–111)
Creatinine, Ser: 0.8 mg/dL (ref 0.44–1.00)
GFR, Estimated: >60 mL/min (ref >60)
Glucose, Bld: 135 mg/dL — ABNORMAL HIGH (ref 70–99)
Potassium: 3.9 mmol/L (ref 3.5–5.1)
Sodium: 137 mmol/L (ref 135–145)
Total Bilirubin: 1 mg/dL (ref 0.3–1.2)
Total Protein: 6.1 g/dL — ABNORMAL LOW (ref 6.5–8.1)

## 2020-11-12 LAB — CBC WITH DIFFERENTIAL/PLATELET
Abs Immature Granulocytes: 0.04 10*3/uL (ref 0.00–0.07)
Basophils Absolute: 0 10*3/uL (ref 0.0–0.1)
Basophils Relative: 0 %
Eosinophils Absolute: 0.2 10*3/uL (ref 0.0–0.5)
Eosinophils Relative: 3 %
HCT: 40.8 % (ref 36.0–46.0)
Hemoglobin: 13.9 g/dL (ref 12.0–15.0)
Immature Granulocytes: 1 %
Lymphocytes Relative: 11 %
Lymphs Abs: 0.9 10*3/uL (ref 0.7–4.0)
MCH: 29.6 pg (ref 26.0–34.0)
MCHC: 34.1 g/dL (ref 30.0–36.0)
MCV: 87 fL (ref 80.0–100.0)
Monocytes Absolute: 0.6 10*3/uL (ref 0.1–1.0)
Monocytes Relative: 8 %
Neutro Abs: 5.9 10*3/uL (ref 1.7–7.7)
Neutrophils Relative %: 77 %
Platelets: 162 10*3/uL (ref 150–400)
RBC: 4.69 MIL/uL (ref 3.87–5.11)
RDW: 12 % (ref 11.5–15.5)
WBC: 7.6 10*3/uL (ref 4.0–10.5)
nRBC: 0 % (ref 0.0–0.2)

## 2020-11-12 LAB — TYPE AND SCREEN
ABO/RH(D): B POS
Antibody Screen: NEGATIVE

## 2020-11-12 LAB — MAGNESIUM: Magnesium: 1.9 mg/dL (ref 1.7–2.4)

## 2020-11-12 LAB — GLUCOSE, CAPILLARY
Glucose-Capillary: 114 mg/dL — ABNORMAL HIGH (ref 70–99)
Glucose-Capillary: 127 mg/dL — ABNORMAL HIGH (ref 70–99)
Glucose-Capillary: 254 mg/dL — ABNORMAL HIGH (ref 70–99)

## 2020-11-12 LAB — POCT ACTIVATED CLOTTING TIME: Activated Clotting Time: 335 seconds

## 2020-11-12 SURGERY — TRANSCAROTID ARTERY REVASCULARIZATION (TCAR)
Anesthesia: General | Site: Neck | Laterality: Right

## 2020-11-12 MED ORDER — FENTANYL CITRATE (PF) 250 MCG/5ML IJ SOLN
INTRAMUSCULAR | Status: AC
  Filled 2020-11-12: qty 5

## 2020-11-12 MED ORDER — HYDRALAZINE HCL 20 MG/ML IJ SOLN: 5.0000 mg | INTRAMUSCULAR | Status: DC | PRN

## 2020-11-12 MED ORDER — LIDOCAINE 2% (20 MG/ML) 5 ML SYRINGE
INTRAMUSCULAR | Status: DC | PRN
  Administered 2020-11-12: 50 mg via INTRAVENOUS

## 2020-11-12 MED ORDER — CEFAZOLIN SODIUM-DEXTROSE 2-4 GM/100ML-% IV SOLN
2.0000 g | Freq: Three times a day (TID) | INTRAVENOUS | Status: AC
  Administered 2020-11-12 – 2020-11-13 (×2): 2 g via INTRAVENOUS
  Filled 2020-11-12 (×2): qty 100

## 2020-11-12 MED ORDER — ONDANSETRON HCL 4 MG/2ML IJ SOLN: 4.0000 mg | Freq: Four times a day (QID) | INTRAMUSCULAR | Status: DC | PRN

## 2020-11-12 MED ORDER — DEXAMETHASONE SODIUM PHOSPHATE 4 MG/ML IJ SOLN
INTRAMUSCULAR | Status: DC | PRN
  Administered 2020-11-12: 4 mg via INTRAVENOUS

## 2020-11-12 MED ORDER — LACTATED RINGERS IV SOLN: INTRAVENOUS | Status: DC | PRN

## 2020-11-12 MED ORDER — FENTANYL CITRATE (PF) 250 MCG/5ML IJ SOLN
INTRAMUSCULAR | Status: DC | PRN
  Administered 2020-11-12: 50 ug via INTRAVENOUS
  Administered 2020-11-12: 100 ug via INTRAVENOUS

## 2020-11-12 MED ORDER — GLYCOPYRROLATE PF 0.2 MG/ML IJ SOSY
PREFILLED_SYRINGE | INTRAMUSCULAR | Status: DC | PRN
  Administered 2020-11-12: .1 mg via INTRAVENOUS

## 2020-11-12 MED ORDER — MORPHINE SULFATE (PF) 2 MG/ML IV SOLN
1.0000 mg | INTRAVENOUS | Status: DC | PRN
  Administered 2020-11-12: 1 mg via INTRAVENOUS
  Filled 2020-11-12: qty 1

## 2020-11-12 MED ORDER — METOPROLOL TARTRATE 5 MG/5ML IV SOLN: 2.0000 mg | INTRAVENOUS | Status: DC | PRN

## 2020-11-12 MED ORDER — HEPARIN SODIUM (PORCINE) 1000 UNIT/ML IJ SOLN
INTRAMUSCULAR | Status: DC | PRN
  Administered 2020-11-12: 5000 [IU] via INTRAVENOUS

## 2020-11-12 MED ORDER — ROCURONIUM BROMIDE 10 MG/ML (PF) SYRINGE
PREFILLED_SYRINGE | INTRAVENOUS | Status: AC
  Filled 2020-11-12: qty 10

## 2020-11-12 MED ORDER — POTASSIUM CHLORIDE CRYS ER 20 MEQ PO TBCR: 20.0000 meq | EXTENDED_RELEASE_TABLET | Freq: Every day | ORAL | Status: DC | PRN

## 2020-11-12 MED ORDER — DEXAMETHASONE SODIUM PHOSPHATE 10 MG/ML IJ SOLN
INTRAMUSCULAR | Status: AC
  Filled 2020-11-12: qty 1

## 2020-11-12 MED ORDER — PHENOL 1.4 % MT LIQD: 1.0000 | OROMUCOSAL | Status: DC | PRN

## 2020-11-12 MED ORDER — HEPARIN 6000 UNIT IRRIGATION SOLUTION
Status: AC
  Filled 2020-11-12: qty 500

## 2020-11-12 MED ORDER — MAGNESIUM SULFATE 2 GM/50ML IV SOLN: 2.0000 g | Freq: Every day | INTRAVENOUS | Status: DC | PRN

## 2020-11-12 MED ORDER — HEPARIN SODIUM (PORCINE) 1000 UNIT/ML IJ SOLN
INTRAMUSCULAR | Status: AC
  Filled 2020-11-12: qty 1

## 2020-11-12 MED ORDER — PROTAMINE SULFATE 10 MG/ML IV SOLN
INTRAVENOUS | Status: DC | PRN
  Administered 2020-11-12: 50 mg via INTRAVENOUS

## 2020-11-12 MED ORDER — LIDOCAINE 2% (20 MG/ML) 5 ML SYRINGE
INTRAMUSCULAR | Status: AC
  Filled 2020-11-12: qty 5

## 2020-11-12 MED ORDER — IOHEXOL 300 MG/ML  SOLN
INTRAMUSCULAR | Status: DC | PRN
  Administered 2020-11-12: 19 mL via INTRA_ARTERIAL

## 2020-11-12 MED ORDER — LIDOCAINE HCL (PF) 1 % IJ SOLN
INTRAMUSCULAR | Status: AC
  Filled 2020-11-12: qty 30

## 2020-11-12 MED ORDER — PROPOFOL 10 MG/ML IV BOLUS
INTRAVENOUS | Status: AC
  Filled 2020-11-12: qty 40

## 2020-11-12 MED ORDER — HEPARIN 6000 UNIT IRRIGATION SOLUTION
Status: DC | PRN
  Administered 2020-11-12: 1

## 2020-11-12 MED ORDER — DOCUSATE SODIUM 100 MG PO CAPS
100.0000 mg | ORAL_CAPSULE | Freq: Every day | ORAL | Status: DC
  Administered 2020-11-13: 100 mg via ORAL
  Filled 2020-11-12: qty 1

## 2020-11-12 MED ORDER — ONDANSETRON HCL 4 MG/2ML IJ SOLN
INTRAMUSCULAR | Status: DC | PRN
  Administered 2020-11-12: 4 mg via INTRAVENOUS

## 2020-11-12 MED ORDER — LABETALOL HCL 5 MG/ML IV SOLN
10.0000 mg | INTRAVENOUS | Status: DC | PRN
Start: 2020-11-12 — End: 2020-11-13

## 2020-11-12 MED ORDER — ONDANSETRON HCL 4 MG/2ML IJ SOLN
INTRAMUSCULAR | Status: AC
  Filled 2020-11-12: qty 2

## 2020-11-12 MED ORDER — 0.9 % SODIUM CHLORIDE (POUR BTL) OPTIME
TOPICAL | Status: DC | PRN
  Administered 2020-11-12: 1000 mL

## 2020-11-12 MED ORDER — ROCURONIUM BROMIDE 100 MG/10ML IV SOLN
INTRAVENOUS | Status: DC | PRN
  Administered 2020-11-12: 20 mg via INTRAVENOUS
  Administered 2020-11-12: 30 mg via INTRAVENOUS

## 2020-11-12 MED ORDER — PROPOFOL 10 MG/ML IV BOLUS
INTRAVENOUS | Status: DC | PRN
  Administered 2020-11-12: 50 mg via INTRAVENOUS

## 2020-11-12 MED ORDER — MIDAZOLAM HCL 2 MG/2ML IJ SOLN
INTRAMUSCULAR | Status: AC
  Filled 2020-11-12: qty 2

## 2020-11-12 MED ORDER — GUAIFENESIN-DM 100-10 MG/5ML PO SYRP: 15.0000 mL | ORAL_SOLUTION | ORAL | Status: DC | PRN

## 2020-11-12 MED ORDER — OXYCODONE-ACETAMINOPHEN 5-325 MG PO TABS
1.0000 | ORAL_TABLET | ORAL | Status: DC | PRN
  Administered 2020-11-12: 1 via ORAL
  Filled 2020-11-12: qty 1

## 2020-11-12 MED ORDER — PHENYLEPHRINE HCL-NACL 10-0.9 MG/250ML-% IV SOLN
INTRAVENOUS | Status: DC | PRN
  Administered 2020-11-12: 50 ug/min via INTRAVENOUS

## 2020-11-12 MED ORDER — SODIUM CHLORIDE 0.9 % IV SOLN: 500.0000 mL | Freq: Once | INTRAVENOUS | Status: DC | PRN

## 2020-11-12 MED ORDER — SUGAMMADEX SODIUM 200 MG/2ML IV SOLN
INTRAVENOUS | Status: DC | PRN
  Administered 2020-11-12: 125 mg via INTRAVENOUS

## 2020-11-12 MED ORDER — GLYCOPYRROLATE PF 0.2 MG/ML IJ SOSY
PREFILLED_SYRINGE | INTRAMUSCULAR | Status: AC
  Filled 2020-11-12: qty 1

## 2020-11-12 SURGICAL SUPPLY — 58 items
BAG BANDED W/RUBBER/TAPE 36X54 (MISCELLANEOUS) ×2 IMPLANT
BALLN STERLING RX 5X20X80 (BALLOONS) ×2
BALLN STERLING RX 5X30X80 (BALLOONS)
BALLOON STERLING RX 5X20X80 (BALLOONS) ×1 IMPLANT
BALLOON STERLING RX 5X30X80 (BALLOONS) IMPLANT
BENZOIN TINCTURE PRP APPL 2/3 (GAUZE/BANDAGES/DRESSINGS) ×4 IMPLANT
BLADE MINI RND TIP GREEN BEAV (BLADE) ×2 IMPLANT
CANISTER SUCT 3000ML PPV (MISCELLANEOUS) ×2 IMPLANT
CHLORAPREP W/TINT 26 (MISCELLANEOUS) ×2 IMPLANT
CLIP VESOCCLUDE MED 6/CT (CLIP) ×2 IMPLANT
CLIP VESOCCLUDE SM WIDE 6/CT (CLIP) ×2 IMPLANT
CLSR STERI-STRIP ANTIMIC 1/2X4 (GAUZE/BANDAGES/DRESSINGS) ×4 IMPLANT
COVER DOME SNAP 22 D (MISCELLANEOUS) ×2 IMPLANT
COVER PROBE W GEL 5X96 (DRAPES) ×2 IMPLANT
DRAPE FEMORAL ANGIO 80X135IN (DRAPES) ×2 IMPLANT
DRAPE INCISE IOBAN 66X45 STRL (DRAPES) ×2 IMPLANT
DRAPE ORTHO SPLIT 77X108 STRL (DRAPES) ×2
DRAPE SURG ORHT 6 SPLT 77X108 (DRAPES) ×1 IMPLANT
DRSG COVADERM 4X6 (GAUZE/BANDAGES/DRESSINGS) ×4 IMPLANT
ELECT REM PT RETURN 9FT ADLT (ELECTROSURGICAL) ×2
ELECTRODE REM PT RTRN 9FT ADLT (ELECTROSURGICAL) ×1 IMPLANT
GAUZE 4X4 16PLY ~~LOC~~+RFID DBL (SPONGE) ×2 IMPLANT
GAUZE SPONGE 4X4 12PLY STRL (GAUZE/BANDAGES/DRESSINGS) ×2 IMPLANT
GLOVE SURG POLYISO LF SZ8 (GLOVE) ×2 IMPLANT
GOWN STRL REUS W/ TWL LRG LVL3 (GOWN DISPOSABLE) ×2 IMPLANT
GOWN STRL REUS W/ TWL XL LVL3 (GOWN DISPOSABLE) ×1 IMPLANT
GOWN STRL REUS W/TWL LRG LVL3 (GOWN DISPOSABLE) ×4
GOWN STRL REUS W/TWL XL LVL3 (GOWN DISPOSABLE) ×2
GUIDEWIRE ENROUTE 0.014 (WIRE) ×2 IMPLANT
INTRODUCER KIT GALT 7CM (INTRODUCER) ×2
KIT BASIN OR (CUSTOM PROCEDURE TRAY) ×2 IMPLANT
KIT ENCORE 26 ADVANTAGE (KITS) ×2 IMPLANT
KIT INTRODUCER GALT 7 (INTRODUCER) ×1 IMPLANT
KIT TURNOVER KIT B (KITS) ×2 IMPLANT
NEEDLE HYPO 25GX1X1/2 BEV (NEEDLE) IMPLANT
PACK CAROTID (CUSTOM PROCEDURE TRAY) ×2 IMPLANT
PENCIL SMOKE EVACUATOR (MISCELLANEOUS) IMPLANT
POSITIONER HEAD DONUT 9IN (MISCELLANEOUS) ×2 IMPLANT
PROTECTION STATION PRESSURIZED (MISCELLANEOUS)
SET MICROPUNCTURE 5F STIFF (MISCELLANEOUS) ×2 IMPLANT
SHEATH AVANTI 11CM 5FR (SHEATH) IMPLANT
SHUNT CAROTID BYPASS 10 (VASCULAR PRODUCTS) IMPLANT
STATION PROTECTION PRESSURIZED (MISCELLANEOUS) IMPLANT
STENT TRANSCAROTID SYSTEM 8X40 (Permanent Stent) ×2 IMPLANT
STRIP CLOSURE SKIN 1/2X4 (GAUZE/BANDAGES/DRESSINGS) ×2 IMPLANT
SUT PROLENE 5 0 C 1 24 (SUTURE) ×2 IMPLANT
SUT PROLENE 6 0 BV (SUTURE) IMPLANT
SUT SILK 2 0 SH CR/8 (SUTURE) ×2 IMPLANT
SUT VIC AB 3-0 SH 27 (SUTURE) ×2
SUT VIC AB 3-0 SH 27X BRD (SUTURE) ×1 IMPLANT
SYR 10ML LL (SYRINGE) ×6 IMPLANT
SYR 20ML LL LF (SYRINGE) ×2 IMPLANT
SYR CONTROL 10ML LL (SYRINGE) ×6 IMPLANT
SYSTEM TRANSCAROTID NEUROPRTCT (MISCELLANEOUS) ×1 IMPLANT
TOWEL GREEN STERILE (TOWEL DISPOSABLE) ×2 IMPLANT
TRANSCAROTID NEUROPROTECT SYS (MISCELLANEOUS) ×2
WATER STERILE IRR 1000ML POUR (IV SOLUTION) ×2 IMPLANT
WIRE BENTSON .035X145CM (WIRE) ×2 IMPLANT

## 2020-11-12 NOTE — Progress Notes (Signed)
PROGRESS NOTE    [name]  [id number] DOB: 09-Feb-1946 DOA: 11/05/2020 PCP: Lavone Orn, MD   Brief Narrative:  [name] is a 75 y.o. female with a known history of hypertension, GERD, vertigo was in a usual state of health until 1 month ago when she developed sudden onset of headache associated with left arm weakness and bilateral lower extremity weakness, She was seen by her primary care doctor who ordered an MRI as an outpatient which was done on the day of admission and showed multiple subacute strokes.  She was admitted for further work-up.  Was found to have critical right-sided internal carotid artery stenosis, vascular consulted and she underwent TCAR on 11/12/2020.  Assessment & Plan:   Principal Problem:   CVA (cerebral vascular accident) ([place]) Active Problems:   GERD (gastroesophageal reflux disease)   Carotid stenosis, right   Hyperlipidemia   Protein-calorie malnutrition, severe  Right MCA stroke/left-sided weakness/right carotid stenosis: Weakness improving on the left side.  Left leg almost back to baseline.  S/p TCAR by vascular surgery today.  Doing well postprocedure.  Continue DAPT and statin.  Dyslipidemia: Placed on high intensity statin, switched from Lipitor to Crestor on 7/26 due to elevated LFTs.  Essential hypertension: Out of window for allowing permissive hypertension.  Need tight control.  Currently fairly acceptable.  Elevated LFTs: Significant jump in LFTs again today despite of transitioning to Crestor.  Ultrasound abdomen negative.  Repeat tomorrow.  Prediabetes: Hemoglobin A1c 6.0.  Blood sugar controlled.  No need of SSI.  DVT prophylaxis: SCD's Start: 11/12/20 1219 enoxaparin (LOVENOX) injection 30 mg Start: 11/05/20 2200 SCD's Start: 11/05/20 1955   Code Status: Full Code  Family Communication:  None present at bedside.   Status is: Inpatient  Remains inpatient appropriate because:Inpatient level of care appropriate due to  severity of illness  Dispo: The patient is from: Home              Anticipated d/c is to: Home              Patient currently is not medically stable to d/c.   Difficult to place patient No        Estimated body mass index is 19.92 kg/m as calculated from the following:   Height as of this encounter: 4' 11.5" (1.511 m).   Weight as of this encounter: 45.5 kg.      Nutritional status:  Nutrition Problem: Severe Malnutrition Etiology: chronic illness   Signs/Symptoms: severe muscle depletion, severe fat depletion   Interventions: Ensure Enlive (each supplement provides 350kcal and 20 grams of protein), MVI    Consultants:  Neurology and vascular surgery  Procedures:  As above  Antimicrobials:  Anti-infectives (From admission, onward)    Start     Dose/Rate Route Frequency Ordered Stop   11/12/20 1600  ceFAZolin (ANCEF) IVPB 2g/100 mL premix        2 g 200 mL/hr over 30 Minutes Intravenous Every 8 hours 11/12/20 1218 11/13/20 0759   11/12/20 0700  cefUROXime (ZINACEF) 1.5 g in sodium chloride 0.9 % 100 mL IVPB        1.5 g 200 mL/hr over 30 Minutes Intravenous On call to O.R. 11/11/20 [id number] 11/12/20 [id number]          Subjective: Seen and examined after the procedure.  Feeling well.  No new complaint.  Objective: Vitals:   11/12/20 1135 11/12/20 1154 11/12/20 1156 11/12/20 1239  BP: (!) 145/70 (!) 148/76  Pulse: 83 87 87 91  Resp: '16 16  18  '$ Temp:      TempSrc:      SpO2: 100%  97% 99%  Weight:      Height:        Intake/Output Summary (Last 24 hours) at 11/12/2020 1334 Last data filed at 11/12/2020 0926 Gross per 24 hour  Intake 1140 ml  Output 25 ml  Net 1115 ml   Filed Weights   11/06/20 1840  Weight: 45.5 kg    Examination:  General exam: Appears calm and comfortable  Respiratory system: Clear to auscultation. Respiratory effort normal. Cardiovascular system: S1 & S2 heard, RRR. No JVD, murmurs, rubs, gallops or clicks. No pedal  edema. Gastrointestinal system: Abdomen is nondistended, soft and nontender. No organomegaly or masses felt. Normal bowel sounds heard. Central nervous system: Alert and oriented.  3/5 strength in left upper extremity. Skin: No rashes, lesions or ulcers Psychiatry: Judgement and insight appear normal. Mood & affect appropriate.    Data Reviewed: I have personally reviewed following labs and imaging studies  CBC: Recent Labs  Lab 11/08/20 0330 11/09/20 0136 11/10/20 0408 11/11/20 0026 11/12/20 0416  WBC 5.8 5.4 5.4 7.5 7.6  NEUTROABS 3.7 3.4 3.5 5.4 5.9  HGB 14.8 14.1 14.4 14.3 13.9  HCT 42.9 41.3 43.3 43.0 40.8  MCV 85.1 86.0 87.1 88.3 87.0  PLT 211 177 188 188 0000000   Basic Metabolic Panel: Recent Labs  Lab 11/07/20 1104 11/08/20 0330 11/09/20 0136 11/10/20 0408 11/11/20 0026 11/12/20 0416  NA 140 140 141 142 140 137  K 4.4 3.8 4.0 4.0 4.8 3.9  CL 107 106 107 103 103 104  CO2 '24 26 25 26 '$ 33* 29  GLUCOSE 113* 116* 133* 112* 137* 135*  BUN 25* 29* 27* 16 21 24*  CREATININE 0.83 0.97 0.81 0.69 0.95 0.80  CALCIUM 9.5 10.0 9.4 9.8 9.7 9.2  MG 1.8 1.8 1.8 1.8  --  1.9   GFR: Estimated Creatinine Clearance: 43.9 mL/min (by C-G formula based on SCr of 0.8 mg/dL). Liver Function Tests: Recent Labs  Lab 11/08/20 0330 11/09/20 0136 11/10/20 0408 11/11/20 0026 11/12/20 0416  AST 28 65* 72* 88* 172*  ALT 26 48* 69* 84* 175*  ALKPHOS 70 69 70 82 153*  BILITOT 0.8 0.8 0.9 0.8 1.0  PROT 6.1* 5.7* 6.2* 6.1* 6.1*  ALBUMIN 3.4* 3.2* 3.5 3.3* 3.3*   No results for input(s): LIPASE, AMYLASE in the last 168 hours. No results for input(s): AMMONIA in the last 168 hours. Coagulation Profile: Recent Labs  Lab 11/05/20 1731 11/07/20 1104  INR 1.0 1.0   Cardiac Enzymes: No results for input(s): CKTOTAL, CKMB, CKMBINDEX, TROPONINI in the last 168 hours. BNP (last 3 results) No results for input(s): PROBNP in the last 8760 hours. HbA1C: No results for input(s): HGBA1C in  the last 72 hours. CBG: Recent Labs  Lab 11/12/20 0923 11/12/20 1204  GLUCAP 114* 127*   Lipid Profile: No results for input(s): CHOL, HDL, LDLCALC, TRIG, CHOLHDL, LDLDIRECT in the last 72 hours. Thyroid Function Tests: No results for input(s): TSH, T4TOTAL, FREET4, T3FREE, THYROIDAB in the last 72 hours. Anemia Panel: No results for input(s): VITAMINB12, FOLATE, FERRITIN, TIBC, IRON, RETICCTPCT in the last 72 hours. Sepsis Labs: No results for input(s): PROCALCITON, LATICACIDVEN in the last 168 hours.  Recent Results (from the past 240 hour(s))  Resp Panel by RT-PCR (Flu A&B, Covid) Nasopharyngeal Swab     Status: None   Collection Time:  11/05/20  5:48 PM   Specimen: Nasopharyngeal Swab; Nasopharyngeal(NP) swabs in vial transport medium  Result Value Ref Range Status   SARS Coronavirus 2 by RT PCR NEGATIVE NEGATIVE Final    Comment: (NOTE) SARS-CoV-2 target nucleic acids are NOT DETECTED.  The SARS-CoV-2 RNA is generally detectable in upper respiratory specimens during the acute phase of infection. The lowest concentration of SARS-CoV-2 viral copies this assay can detect is 138 copies/mL. A negative result does not preclude SARS-Cov-2 infection and should not be used as the sole basis for treatment or other patient management decisions. A negative result may occur with  improper specimen collection/handling, submission of specimen other than nasopharyngeal swab, presence of viral mutation(s) within the areas targeted by this assay, and inadequate number of viral copies(<138 copies/mL). A negative result must be combined with clinical observations, patient history, and epidemiological information. The expected result is Negative.  Fact Sheet for Patients:  EntrepreneurPulse.com.au  Fact Sheet for Healthcare Providers:  IncredibleEmployment.be  This test is no t yet approved or cleared by the [place] FDA and  has been authorized  for detection and/or diagnosis of SARS-CoV-2 by FDA under an Emergency Use Authorization (EUA). This EUA will remain  in effect (meaning this test can be used) for the duration of the COVID-19 declaration under Section 564(b)(1) of the Act, 21 U.S.C.section 360bbb-3(b)(1), unless the authorization is terminated  or revoked sooner.       Influenza A by PCR NEGATIVE NEGATIVE Final   Influenza B by PCR NEGATIVE NEGATIVE Final    Comment: (NOTE) The Xpert Xpress SARS-CoV-2/FLU/RSV plus assay is intended as an aid in the diagnosis of influenza from Nasopharyngeal swab specimens and should not be used as a sole basis for treatment. Nasal washings and aspirates are unacceptable for Xpert Xpress SARS-CoV-2/FLU/RSV testing.  Fact Sheet for Patients: EntrepreneurPulse.com.au  Fact Sheet for Healthcare Providers: IncredibleEmployment.be  This test is not yet approved or cleared by the [place] FDA and has been authorized for detection and/or diagnosis of SARS-CoV-2 by FDA under an Emergency Use Authorization (EUA). This EUA will remain in effect (meaning this test can be used) for the duration of the COVID-19 declaration under Section 564(b)(1) of the Act, 21 U.S.C. section 360bbb-3(b)(1), unless the authorization is terminated or revoked.  Performed at [facility], [place] [address], [place], [place] [address]   Surgical PCR screen     Status: None   Collection Time: 11/11/20 10:17 PM   Specimen: Nasal Mucosa; Nasal Swab  Result Value Ref Range Status   MRSA, PCR NEGATIVE NEGATIVE Final   Staphylococcus aureus NEGATIVE NEGATIVE Final    Comment: (NOTE) The Xpert SA Assay (FDA approved for NASAL specimens in patients 68 years of age and older), is one component of a comprehensive surveillance program. It is not intended to diagnose infection nor to guide or monitor treatment. Performed at [facility], [place] [address], [place], [place] [address]       Radiology Studies: Structural Heart Procedure  Result Date: 11/12/2020 See surgical note for result.  HYBRID OR IMAGING ([facility] ONLY)  Result Date: 11/12/2020 There is no interpretation for this exam.  This order is for images obtained during a surgical procedure.  Please See "Surgeries" Tab for more information regarding the procedure.    Scheduled Meds:   stroke: mapping our early stages of recovery book   Does not apply Once   aspirin EC  81 mg Oral Daily   clopidogrel  75 mg Oral Daily   [START ON 11/13/2020] docusate sodium  100 mg Oral Daily   enoxaparin (LOVENOX) injection  30 mg Subcutaneous Q24H   feeding supplement  237 mL Oral BID BM   multivitamin with minerals  1 tablet Oral Daily   mupirocin ointment  1 application Nasal BID   neomycin-bacitracin-polymyxin   Topical BID   pantoprazole  40 mg Oral q AM   rosuvastatin  20 mg Oral Daily   Continuous Infusions:  sodium chloride      ceFAZolin (ANCEF) IV     magnesium sulfate bolus IVPB       LOS: 7 days   Time spent: 34 minutes   Darliss Cheney, MD [facility]  11/12/2020, 1:34 PM   How to contact the [facility] Attending or Consulting provider [place] or covering provider during after hours [place], for this patient?  Check the care team in [facility] and look for a) attending/consulting [facility] provider listed and b) the [facility] team listed. Page or secure chat [phone]. Log into [email or web address] and use [place]'s universal password to access. If you do not have the password, please contact the hospital operator. Locate the [facility] provider you are looking for under [facility] and page to a number that you can be directly reached. If you still have difficulty reaching the provider, please page the [facility] (Director on Call) for the Hospitalists listed on amion for assistance.

## 2020-11-12 NOTE — Progress Notes (Signed)
OT Cancellation Note  Patient Details Name: Forest Otey MRN: [id number] DOB: 1946-04-03   Cancelled Treatment:    Reason Eval/Treat Not Completed: Patient at procedure or test/ unavailable.  Will check back as schedule permits.  Delbert Phenix OT OT pager: [phone] 11/12/2020, 11:33 AM

## 2020-11-12 NOTE — Progress Notes (Signed)
PT Cancellation Note  Patient Details Name: [name] MRN: [id number] DOB: 04/04/1946   Cancelled Treatment:    Reason Eval/Treat Not Completed: Patient at procedure or test/unavailable. Will re-attempt PT this pm as appropriate.    Pascual Mantel 11/12/2020, 9:08 AM

## 2020-11-12 NOTE — Progress Notes (Signed)
[facility] [place] PROGRESS NOTE  ASSESSMENT / PLAN: [name] is a 75 y.o. female with symptomatic right carotid artery stenosis. Plan R TCAR tomorrow. We reviewed the risks / benefits / alternatives with her and the family. She is understanding and wishes to proceed. Continue ASA / Plavix / Statin.   SUBJECTIVE: No interval changes. Reviewed risks / benefits / alternatives with family and patient.  OBJECTIVE: BP (!) 141/76 (BP Location: Left Arm)   Pulse 88   Temp 98.1 F (36.7 C) (Oral)   Resp 16   Ht 4' 11.5" (1.511 m)   Wt 45.5 kg   SpO2 96%   BMI 19.92 kg/m   Intake/Output Summary (Last 24 hours) at 11/12/2020 0733 Last data filed at 11/11/2020 1900 Gross per 24 hour  Intake 800 ml  Output --  Net 800 ml    Constitutional: well appearing. no acute distress. CNS: 4/5 LUE Cardiac: RRR. Pulmonary: unlabored  CBC Latest Ref Rng & Units 11/12/2020 11/11/2020 11/10/2020  WBC 4.0 - 10.5 K/uL 7.6 7.5 5.4  Hemoglobin 12.0 - 15.0 g/dL 13.9 14.3 14.4  Hematocrit 36.0 - 46.0 % 40.8 43.0 43.3  Platelets 150 - 400 K/uL 162 188 188     CMP Latest Ref Rng & Units 11/12/2020 11/11/2020 11/10/2020  Glucose 70 - 99 mg/dL 135(H) 137(H) 112(H)  BUN 8 - 23 mg/dL 24(H) 21 16  Creatinine 0.44 - 1.00 mg/dL 0.80 0.95 0.69  Sodium 135 - 145 mmol/L 137 140 142  Potassium 3.5 - 5.1 mmol/L 3.9 4.8 4.0  Chloride 98 - 111 mmol/L 104 103 103  CO2 22 - 32 mmol/L 29 33(H) 26  Calcium 8.9 - 10.3 mg/dL 9.2 9.7 9.8  Total Protein 6.5 - 8.1 g/dL 6.1(L) 6.1(L) 6.2(L)  Total Bilirubin 0.3 - 1.2 mg/dL 1.0 0.8 0.9  Alkaline Phos 38 - 126 U/L 153(H) 82 70  AST 15 - 41 U/L 172(H) 88(H) 72(H)  ALT 0 - 44 U/L 175(H) 84(H) 69(H)    Estimated Creatinine Clearance: 43.9 mL/min (by C-G formula based on SCr of 0.8 mg/dL).  [name]. Stanford Breed, MD [facility] Phone Number: [phone] 11/12/2020 7:33 AM

## 2020-11-12 NOTE — Anesthesia Procedure Notes (Signed)
Arterial Line Insertion Start/End7/27/2022 7:05 AM, 11/12/2020 7:10 AM Performed by: CRNA  Patient location: Pre-op. Preanesthetic checklist: patient identified, IV checked, site marked, risks and benefits discussed, surgical consent, monitors and equipment checked, pre-op evaluation, timeout performed and anesthesia consent Lidocaine 1% used for infiltration Left, radial was placed Catheter size: 20 G Hand hygiene performed  and maximum sterile barriers used   Attempts: 1 Procedure performed without using ultrasound guided technique. Following insertion, dressing applied and Biopatch. Post procedure assessment: normal  Patient tolerated the procedure well with no immediate complications.

## 2020-11-12 NOTE — Anesthesia Postprocedure Evaluation (Signed)
Anesthesia Post Note  Patient: [name]  Procedure(s) Performed: RIGHT TRANSCAROTID ARTERY REVASCULARIZATION (Right: Neck)     Patient location during evaluation: PACU Anesthesia Type: General Level of consciousness: awake Pain management: pain level controlled Vital Signs Assessment: post-procedure vital signs reviewed and stable Respiratory status: spontaneous breathing, nonlabored ventilation, respiratory function stable and patient connected to nasal cannula oxygen Cardiovascular status: blood pressure returned to baseline and stable Postop Assessment: no apparent nausea or vomiting Anesthetic complications: no   No notable events documented.  Last Vitals:  Vitals:   11/12/20 1500 11/12/20 1600  BP: 128/76 132/73  Pulse: 95 86  Resp:  17  Temp: 36.6 C 36.6 C  SpO2:  95%    Last Pain:  Vitals:   11/12/20 1600  TempSrc: Oral  PainSc:                  Lenyx Boody P Marcell Chavarin

## 2020-11-12 NOTE — Anesthesia Procedure Notes (Signed)
Procedure Name: Intubation Date/Time: 11/12/2020 7:52 AM Performed by: Lieutenant Diego, CRNA Pre-anesthesia Checklist: Patient identified, Emergency Drugs available, Suction available and Patient being monitored Patient Re-evaluated:Patient Re-evaluated prior to induction Oxygen Delivery Method: Circle system utilized Preoxygenation: Pre-oxygenation with 100% oxygen Induction Type: IV induction Ventilation: Mask ventilation without difficulty Laryngoscope Size: Miller and 2 Grade View: Grade I Tube type: Oral Tube size: 6.5 mm Number of attempts: 1 Airway Equipment and Method: Stylet and Oral airway Placement Confirmation: ETT inserted through vocal cords under direct vision, positive ETCO2 and breath sounds checked- equal and bilateral Secured at: 20 cm Tube secured with: Tape Dental Injury: Teeth and Oropharynx as per pre-operative assessment

## 2020-11-12 NOTE — Plan of Care (Signed)
  Problem: Education: Goal: Knowledge of disease or condition will improve Outcome: Progressing Goal: Knowledge of secondary prevention will improve Outcome: Progressing Goal: Knowledge of patient specific risk factors addressed and post discharge goals established will improve Outcome: Progressing   Problem: Coping: Goal: Will identify appropriate support needs Outcome: Progressing   Problem: Self-Care: Goal: Ability to participate in self-care as condition permits will improve Outcome: Progressing Goal: Verbalization of feelings and concerns over difficulty with self-care will improve Outcome: Progressing   Problem: Education: Goal: Knowledge of General Education information will improve Description: Including pain rating scale, medication(s)/side effects and non-pharmacologic comfort measures Outcome: Progressing   Problem: Health Behavior/Discharge Planning: Goal: Ability to manage health-related needs will improve Outcome: Progressing   Problem: Clinical Measurements: Goal: Ability to maintain clinical measurements within normal limits will improve Outcome: Progressing Goal: Will remain free from infection Outcome: Progressing Goal: Diagnostic test results will improve Outcome: Progressing Goal: Respiratory complications will improve Outcome: Progressing Goal: Cardiovascular complication will be avoided Outcome: Progressing   Problem: Activity: Goal: Risk for activity intolerance will decrease Outcome: Progressing   Problem: Nutrition: Goal: Adequate nutrition will be maintained Outcome: Progressing   Problem: Coping: Goal: Level of anxiety will decrease Outcome: Progressing   Problem: Elimination: Goal: Will not experience complications related to bowel motility Outcome: Progressing Goal: Will not experience complications related to urinary retention Outcome: Progressing   Problem: Pain Managment: Goal: General experience of comfort will  improve Outcome: Progressing   Problem: Safety: Goal: Ability to remain free from injury will improve Outcome: Progressing   Problem: Skin Integrity: Goal: Risk for impaired skin integrity will decrease Outcome: Progressing

## 2020-11-12 NOTE — Op Note (Signed)
DATE OF SERVICE: 11/12/2020  [name]:  [name]  75 y.o. female  PRE-OPERATIVE DIAGNOSIS:  symptomatic right carotid artery stenosis  POST-OPERATIVE DIAGNOSIS:  Same  PROCEDURE:   Right transcarotid artery revascularization (TCAR)  SURGEON:  Surgeon(s) and Role:    * Cherre Robins, MD - Primary    * Waynetta Sandy, MD  ASSISTANT: Servando Snare, MD  An assistant was required to facilitate exposure and expedite Dawn case.  ANESTHESIA:   general  EBL: min  BLOOD ADMINISTERED:none  DRAINS: none   LOCAL MEDICATIONS USED:  NONE  SPECIMEN:  none  COUNTS: confirmed correct.  TOURNIQUET:  none  [name] DISPOSITION:  PACU - hemodynamically stable.   Delay start of Pharmacological VTE agent (>24hrs) due to surgical blood loss or risk of bleeding: no  INDICATION FOR PROCEDURE: [name] is a 75 y.o. female with recent right hemispheric stroke and severe right carotid artery stenosis. Preoperative imaging revealed Dawn lesion to be very cranial in Dawn internal carotid artery making endarterectomy higher risk for cranial nerve injury. After careful discussion of risks, benefits, and alternatives [name] was offered TCAR. We specifically discussed risk of stroke (<2%), and risk of cranial nerve injury (<2%). [name] understood and wished to proceed.  OPERATIVE FINDINGS: good result from TCAR. 7.5 minutes of flow reversal time. 5x73m predilation balloon. 8x440mstent placed with good technical result. [name] awoke with recrudescence of left arm symptoms which improved in Dawn PACU. No evidence of cranial nerve injury.  DESCRIPTION OF PROCEDURE: After identification of [name] in Dawn pre-operative holding area, [name] was transferred to Dawn operating room. [name] was positioned supine on Dawn operating room table. Anesthesia was induced. Dawn right neck and groins were prepped and draped in standard fashion. A surgical pause was  performed confirming correct [name], procedure, and operative location.  Using intraoperative ultrasound Dawn course of Dawn right common carotid artery was mapped on Dawn skin.  A transverse incision was made between Dawn sternal and clavicular heads of Dawn sternocleidomastoid muscle, below Dawn omohyoid. Following longitudinal division of Dawn carotid sheath Dawn jugular vein was partially skeletonized and retracted medially. Once 3 cm of common carotid artery (CCA) were isolated, umbilical tape was placed around Dawn proximal 1/3 of Dawn CCA under direct vision. A 5-O Prolene suture was pre-placed in Dawn anterior wall of Dawn CCA, in a "U stitch" configuration, close to Dawn clavicle to facilitate hemostasis upon removal of Dawn arterial sheath at completion of Dawn TCAR procedure.   Dawn contralateral common femoral vein (CFV) was accessed under ultrasound guidance, using standard Seldinger and micropuncture access technique. Dawn venous return sheath was advanced into Dawn CFV over Dawn 0.035" wire provided. Blood was aspirated from Dawn flow line followed by flushing of Dawn Venous Sheath with heparinized saline. Dawn Venous Sheath was secured to [name]'s skin with suture to maintain optimal position in Dawn vessel. Heparin was given to obtain a therapeutic activated clotting time >250 seconds prior to arterial access.   A 4-French non-stiffened ENHANCE Transcarotid / Peripheral Access set was used, puncturing Dawn artery with Dawn 21G needle through Dawn pre-placed "U" stitch while holding gentle traction on Dawn umbilical tape to stabilize and centralize Dawn CCA within Dawn incision. Careful attention was paid to Dawn change in CCA shape when using Dawn umbilical tape to control or lift Dawn artery. Dawn micropuncture wire was then advanced 3-4 cm into Dawn CCA and, Dawn 21G needle was removed. Dawn  micropuncture sheath was advanced 2-3 cm into Dawn CCA and Dawn wire and dilator were removed. Pulsatile backflow indicated correct  positioning. Dawn provided 0.035" J-tipped guidewire was inserted as close as possible to Dawn bifurcation without engaging Dawn lesion. After micropuncture sheath removal, Dawn Transcarotid Arterial Sheath was advanced to Dawn 2.5cm marker and Dawn 0.035" wire and dilator were then removed. Arterial Sheath position was assessed under fluoroscopy in two projections to ensure that Dawn sheath tip was oriented coaxially in Dawn CCA. Dawn Arterial Sheath was sutured to [name] with gentle forward tension. Blood was slowly aspirated followed by flushing with heparinized saline. No ingress of air bubbles through Dawn passive hemostatic valve was observed. Dawn stopcocks were closed. Traction applied to Dawn CCA previously to facilitate access was gently released.  Dawn Flow Controller was connected to Dawn Transcarotid Arterial Sheath, prepared by passively allowing a column of arterial blood to fill Dawn line and connected to Dawn Venous Return Sheath. CCA inflow was occluded proximal to Dawn arteriotomy with a vascular clamp to achieve active flow reversal. To confirm flow reversal, a saline bolus was delivered into Dawn venous flow line on both "High" and "Low" flow settings of Dawn Flow Controller. Angiograms were performed with slow injections of a small amount of contrast filling just past Dawn lesion to minimize antegrade transmission of micro-bubbles. Prior to lesion manipulation, heart rate ([id number]) and systolic BP (123456) were managed upwards to optimize flow reversal and procedural neuroprotection. Dawn lesion was crossed with an 0.014" ENROUTE guidewire and pre-dilation of Dawn lesion was performed with a 5x51m rapid exchange 0.014" compatible balloon catheter to 8 atmospheres for 10 seconds. Stenting was performed with an 8x489mENROUTE Transcarotid stent, sized appropriately to Dawn right CCA.  A total of 7.87m46mtes of flow reversal was used.  AP and lateral angiograms (gentle contrast injections) were performed  to confirm stent placement and arterial wall stent apposition. [facility], antegrade flow was restored by releasing Dawn clamp on Dawn CCA then closing Dawn NPS stopcocks to Dawn flow lines. Dawn Transcarotid Arterial Sheath was removed and Dawn pre-closure suture was tied. Heparin reversal was employed and a drain was placed. Dawn Venous Return Sheath was removed and hemostasis was achieved with brief manual compression.   Upon completion of Dawn case instrument and sharps counts were confirmed correct. [name] was transferred to Dawn PACU in good condition. [name] did have recrudescence of her left arm symptoms which improved after settling in Dawn PACU. [name] returned to her neurologic baseline (left arm weakness). I was present for all portions of Dawn procedure.  ThoYevonne AlineawStanford BreedD [facility] Number: [phone] [id number]/2022 9:54 AM

## 2020-11-12 NOTE — Discharge Instructions (Signed)
   [facility]  Discharge Instructions   Carotid Endarterectomy (CEA)  Please refer to the following instructions for your post-procedure care. Your surgeon or physician assistant will discuss any changes with you.  Activity  You are encouraged to walk as much as you can. You can slowly return to normal activities but must avoid strenuous activity and heavy lifting until your doctor tell you it's OK. Avoid activities such as vacuuming or swinging a golf club. You can drive after one week if you are comfortable and you are no longer taking prescription pain medications. It is normal to feel tired for serval weeks after your surgery. It is also normal to have difficulty with sleep habits, eating, and bowel movements after surgery. These will go away with time.  Bathing/Showering  You may shower after you come home. Do not soak in a bathtub, hot tub, or swim until the incision heals completely.  Incision Care  Shower every day. Clean your incision with mild soap and water. Pat the area dry with a clean towel. You do not need a bandage unless otherwise instructed. Do not apply any ointments or creams to your incision. You may have skin glue on your incision. Do not peel it off. It will come off on its own in about one week. Your incision may feel thickened and raised for several weeks after your surgery. This is normal and the skin will soften over time. For Men Only: It's OK to shave around the incision but do not shave the incision itself for 2 weeks. It is common to have numbness under your chin that could last for several months.  Diet  Resume your normal diet. There are no special food restrictions following this procedure. A low fat/low cholesterol diet is recommended for all patients with vascular disease. In order to heal from your surgery, it is CRITICAL to get adequate nutrition. Your body requires vitamins, minerals, and protein. Vegetables are the best  source of vitamins and minerals. Vegetables also provide the perfect balance of protein. Processed food has little nutritional value, so try to avoid this.        Medications  Resume taking all of your medications unless your doctor or physician assistant tells you not to. If your incision is causing pain, you may take over-the- counter pain relievers such as acetaminophen (Tylenol). If you were prescribed a stronger pain medication, please be aware these medications can cause nausea and constipation. Prevent nausea by taking the medication with a snack or meal. Avoid constipation by drinking plenty of fluids and eating foods with a high amount of fiber, such as fruits, vegetables, and grains. Do not take Tylenol if you are taking prescription pain medications.  Follow Up  Our office will schedule a follow up appointment 2-3 weeks following discharge.  Please call us immediately for any of the following conditions  Increased pain, redness, drainage (pus) from your incision site. Fever of 101 degrees or higher. If you should develop stroke (slurred speech, difficulty swallowing, weakness on one side of your body, loss of vision) you should call 911 and go to the nearest emergency room.  Reduce your risk of vascular disease:  Stop smoking. If you would like help call [facility] at 1-800-QUIT-NOW (1-800-784-8669) or [place] at [phone]. Manage your cholesterol Maintain a desired weight Control your diabetes Keep your blood pressure down  If you have any questions, please call the office at [phone].   

## 2020-11-12 NOTE — Transfer of Care (Signed)
Immediate Anesthesia Transfer of Care Note  Patient: [name]  Procedure(s) Performed: RIGHT TRANSCAROTID ARTERY REVASCULARIZATION (Right: Neck)  Patient Location: PACU  Anesthesia Type:General  Level of Consciousness: awake  Airway & Oxygen Therapy: Patient Spontanous Breathing and Patient connected to face mask oxygen  Post-op Assessment: Report given to RN and Post -op Vital signs reviewed and stable  Post vital signs: Reviewed and stable  Last Vitals:  Vitals Value Taken Time  BP 130/93 11/12/20 0923  Temp    Pulse 112 11/12/20 0924  Resp 24 11/12/20 0924  SpO2 92 % 11/12/20 0924  Vitals shown include unvalidated device data.  Last Pain:  Vitals:   11/12/20 0313  TempSrc: Oral  PainSc:       Patients Stated Pain Goal: 0 ([id number])  Complications: No notable events documented.

## 2020-11-13 ENCOUNTER — Encounter (HOSPITAL_COMMUNITY): Payer: Self-pay | Admitting: Vascular Surgery

## 2020-11-13 DIAGNOSIS — I63231 Cerebral infarction due to unspecified occlusion or stenosis of right carotid arteries: Secondary | ICD-10-CM | POA: Diagnosis not present

## 2020-11-13 DIAGNOSIS — I63511 Cerebral infarction due to unspecified occlusion or stenosis of right middle cerebral artery: Secondary | ICD-10-CM | POA: Diagnosis not present

## 2020-11-13 DIAGNOSIS — Z20822 Contact with and (suspected) exposure to covid-19: Secondary | ICD-10-CM | POA: Diagnosis not present

## 2020-11-13 DIAGNOSIS — E43 Unspecified severe protein-calorie malnutrition: Secondary | ICD-10-CM | POA: Diagnosis not present

## 2020-11-13 DIAGNOSIS — Z006 Encounter for examination for normal comparison and control in clinical research program: Secondary | ICD-10-CM | POA: Diagnosis not present

## 2020-11-13 LAB — CBC WITH DIFFERENTIAL/PLATELET
Abs Immature Granulocytes: 0.03 10*3/uL (ref 0.00–0.07)
Basophils Absolute: 0 10*3/uL (ref 0.0–0.1)
Basophils Relative: 0 %
Eosinophils Absolute: 0 10*3/uL (ref 0.0–0.5)
Eosinophils Relative: 0 %
HCT: 36 % (ref 36.0–46.0)
Hemoglobin: 12.2 g/dL (ref 12.0–15.0)
Immature Granulocytes: 0 %
Lymphocytes Relative: 6 %
Lymphs Abs: 0.6 10*3/uL — ABNORMAL LOW (ref 0.7–4.0)
MCH: 29.3 pg (ref 26.0–34.0)
MCHC: 33.9 g/dL (ref 30.0–36.0)
MCV: 86.3 fL (ref 80.0–100.0)
Monocytes Absolute: 0.6 10*3/uL (ref 0.1–1.0)
Monocytes Relative: 6 %
Neutro Abs: 8 10*3/uL — ABNORMAL HIGH (ref 1.7–7.7)
Neutrophils Relative %: 88 %
Platelets: 173 10*3/uL (ref 150–400)
RBC: 4.17 MIL/uL (ref 3.87–5.11)
RDW: 11.9 % (ref 11.5–15.5)
WBC: 9.1 10*3/uL (ref 4.0–10.5)
nRBC: 0 % (ref 0.0–0.2)

## 2020-11-13 LAB — COMPREHENSIVE METABOLIC PANEL
ALT: 165 U/L — ABNORMAL HIGH (ref 0–44)
AST: 121 U/L — ABNORMAL HIGH (ref 15–41)
Albumin: 3.1 g/dL — ABNORMAL LOW (ref 3.5–5.0)
Alkaline Phosphatase: 161 U/L — ABNORMAL HIGH (ref 38–126)
Anion gap: 8 (ref 5–15)
BUN: 17 mg/dL (ref 8–23)
CO2: 25 mmol/L (ref 22–32)
Calcium: 8.6 mg/dL — ABNORMAL LOW (ref 8.9–10.3)
Chloride: 103 mmol/L (ref 98–111)
Creatinine, Ser: 0.71 mg/dL (ref 0.44–1.00)
GFR, Estimated: >60 mL/min (ref >60)
Glucose, Bld: 155 mg/dL — ABNORMAL HIGH (ref 70–99)
Potassium: 3.8 mmol/L (ref 3.5–5.1)
Sodium: 136 mmol/L (ref 135–145)
Total Bilirubin: 0.7 mg/dL (ref 0.3–1.2)
Total Protein: 5.7 g/dL — ABNORMAL LOW (ref 6.5–8.1)

## 2020-11-13 LAB — LIPID PANEL
Cholesterol: 130 mg/dL (ref 0–200)
HDL: 53 mg/dL (ref >40)
LDL Cholesterol: 55 mg/dL (ref 0–99)
Total CHOL/HDL Ratio: 2.5 RATIO
Triglycerides: 109 mg/dL (ref <150)
VLDL: 22 mg/dL (ref 0–40)

## 2020-11-13 LAB — MAGNESIUM: Magnesium: 1.9 mg/dL (ref 1.7–2.4)

## 2020-11-13 MED ORDER — CLOPIDOGREL BISULFATE 75 MG PO TABS: 75.0000 mg | ORAL_TABLET | Freq: Every day | ORAL | 0 refills | Status: AC

## 2020-11-13 MED ORDER — ASPIRIN 81 MG PO TBEC: 81.0000 mg | DELAYED_RELEASE_TABLET | Freq: Every day | ORAL | 11 refills | Status: DC

## 2020-11-13 MED ORDER — ROSUVASTATIN CALCIUM 20 MG PO TABS: 20.0000 mg | ORAL_TABLET | Freq: Every day | ORAL | 0 refills | Status: DC

## 2020-11-13 NOTE — Progress Notes (Signed)
Physical Therapy Treatment Patient Details Name: [name] MRN: [id number] DOB: April 13, 1946 Today's Date: 11/13/2020    History of Present Illness 75 yo female presenting 7/20 after outpatient MRI revealed multiple subacute strokes. The pt reports she has been experiencing dizziness and weakness in LUE and bilateral LE for ~1 month prior to [place]. Work up revealed R MCA CVA, right 78 - 99 % carotid artery stenosis with planned revascularization sx 11/17/20. PMH includes: vertigo, HTN, and GERD.    PT Comments    Pt received in supine, agreeable to therapy session and with good participation and tolerance for gait progression with and without assistive device. Pt modI using junior rollator for hallway gait trial and Supervision for household distance gait task with no AD. Pt reports 7/10 modified RPE (fatigue) at end of session. Encouraged pt to use call bell for toileting assist to bathroom as pt able to get up with Supervision and does not need purewick and bed alarm on for safety, pt agreeable to use. Pt continues to benefit from PT services to progress toward functional mobility goals.    Follow Up Recommendations  Home health PT;Supervision for mobility/OOB     Equipment Recommendations  Other (comment) (junior rollator (pt is 33f 11"))    Recommendations for Other Services       Precautions / Restrictions Precautions Precautions: Fall Restrictions Weight Bearing Restrictions: No    Mobility  Bed Mobility Overal bed mobility: Needs Assistance Bed Mobility: Supine to Sit     Supine to sit: Modified independent (Device/Increase time) Sit to supine: Modified independent (Device/Increase time)   General bed mobility comments: Pt able to come to EOB given increased time, rail used to pull up, increased effort to perform.    Transfers Overall transfer level: Needs assistance Equipment used: None;4-wheeled walker Transfers: Sit to/from Stand Sit to Stand:  Modified independent (Device/Increase time)         General transfer comment: from EOB and toilet heights, able to stand with/without rollator  Ambulation/Gait Ambulation/Gait assistance: Supervision;Modified independent (Device/Increase time) Gait Distance (Feet): 350 Feet (3572f 6034fith seated break) Assistive device: None;4-wheeled walker Gait Pattern/deviations: Step-through pattern;Decreased step length - left;Decreased stride length Gait velocity: decr Gait velocity interpretation: <1.31 ft/sec, indicative of household ambulator General Gait Details: Pt utilized rollator in hallway with initial cues for use of device and good carryover, slow cadence no LOB. Able to improve activity tolerance and gait confidence for longer distances with AD use. In room she was able to use no AD ~24f62fth no support with occasional touching of furniture for reassurance. No loss of balance.   Stairs             Wheelchair Mobility    Modified Rankin (Stroke Patients Only) Modified Rankin (Stroke Patients Only) Pre-Morbid Rankin Score: Moderate disability Modified Rankin: Moderate disability (modI with rollator)     Balance Overall balance assessment: Needs assistance Sitting-balance support: Feet supported;No upper extremity supported Sitting balance-Leahy Scale: Good     Standing balance support: No upper extremity supported Standing balance-Leahy Scale: Fair Standing balance comment: pt performed her own peri-care after toileting without assist, no LOB with static/dynamic standing tasks but occasionally reaches out for furniture for reassurance more than needing support                            Cognition Arousal/Alertness: Awake/alert Behavior During Therapy: WFL for tasks assessed/performed Overall Cognitive Status: Difficult to assess  General Comments: Appears to be [facility]. Broken english, no interpreter available  at this time.  Pt following commands and asking questions about discharge process, oriented x 4. Pt states grandson (71 y/o) will be able to help her at home.      Exercises General Exercises - Lower Extremity Ankle Circles/Pumps: AROM;Both;10 reps;Seated Long Arc Quad: AROM;Both;15 reps;Seated Hip Flexion/Marching: AROM;Both;10 reps;Seated Hand Exercises Opposition: Left;AROM;Seated;10 reps Other Exercises Other Exercises: BUE AROM: shoulder flexion (to 90 deg) x10 reps ea    General Comments        Pertinent Vitals/Pain Pain Assessment: 0-10 Pain Score: 5  Pain Location: sore throat/R neck from surgical incision and L wrist from previous IV/swollen wrist pain Pain Descriptors / Indicators: Sore Pain Intervention(s): Monitored during session;Repositioned (offered ice pack)     PT Goals (current goals can now be found in the care plan section) Acute Rehab PT Goals Patient Stated Goal: to get better PT Goal Formulation: With patient/family Time For Goal Achievement: 11/20/20 Progress towards PT goals: Progressing toward goals    Frequency    Min 4X/week      PT Plan Current plan remains appropriate       AM-PAC PT "6 Clicks" Mobility   Outcome Measure  Help needed turning from your back to your side while in a flat bed without using bedrails?: None Help needed moving from lying on your back to sitting on the side of a flat bed without using bedrails?: None Help needed moving to and from a bed to a chair (including a wheelchair)?: A Little Help needed standing up from a chair using your arms (e.g., wheelchair or bedside chair)?: None Help needed to walk in hospital room?: A Little Help needed climbing 3-5 steps with a railing? : A Little 6 Click Score: 21    End of Session Equipment Utilized During Treatment: Gait belt Activity Tolerance: Patient tolerated treatment well Patient left: with call bell/phone within reach;in chair;with chair alarm set (seated EOB  awaiting her dinner.) Nurse Communication: Mobility status PT Visit Diagnosis: Unsteadiness on feet (R26.81);Muscle weakness (generalized) (M62.81)     Time: [id number] PT Time Calculation (min) (ACUTE ONLY): 35 min  Charges:  $Gait Training: 23-37 mins                     Kamarrion Stfort P., PTA Acute Rehabilitation Services Pager: [phone] Office: [place] 11/13/2020, 11:35 AM

## 2020-11-13 NOTE — Progress Notes (Signed)
Occupational Therapy Treatment Patient Details Name: Angelicia Lessner MRN: [id number] DOB: 02-03-1946 Today's Date: 11/13/2020    History of present illness 75 yo female presenting 7/20 after outpatient MRI revealed multiple subacute strokes. The pt reports she has been experiencing dizziness and weakness in LUE and bilateral LE for ~1 month prior to [place]. Work up revealed R MCA CVA, right 9 - 99 % carotid artery stenosis with planned revascularization sx 11/17/20. PMH includes: vertigo, HTN, and GERD.   OT comments  Pt progressing well towards OT goals (met and upgraded accordingly). Session focused on mobility to/from bathroom, toileting tasks and dressing tasks in prep for planned DC home today. Pt requires no more than Supervision for ADLs/mobility but requires frequent cues for Rollator safety (will need reinforcement at home). Pt excited for opportunity to return home and shower today. Recommended pt have family present for safety with initial showering task completion. Continue to recommend [place] follow-up at DC.    Follow Up Recommendations  Home health OT    Equipment Recommendations  None recommended by OT    Recommendations for Other Services      Precautions / Restrictions Precautions Precautions: Fall Restrictions Weight Bearing Restrictions: No       Mobility Bed Mobility Overal bed mobility: Modified Independent Bed Mobility: Supine to Sit     Supine to sit: Modified independent (Device/Increase time) Sit to supine: Modified independent (Device/Increase time)   General bed mobility comments: increased time, use of bed rail and cueing for sequencing    Transfers Overall transfer level: Needs assistance Equipment used: 4-wheeled walker;None Transfers: Sit to/from Stand Sit to Stand: Modified independent (Device/Increase time) Stand pivot transfers: Supervision       General transfer comment: able to stand without AD for LB dressing tasks, frequent  cues needed for rollator use    Balance Overall balance assessment: Needs assistance Sitting-balance support: Feet supported;No upper extremity supported Sitting balance-Leahy Scale: Good     Standing balance support: No upper extremity supported Standing balance-Leahy Scale: Fair Standing balance comment: fair static standing for LB ADLs in standing                           ADL either performed or assessed with clinical judgement   ADL Overall ADL's : Needs assistance/impaired Eating/Feeding: Set up Eating/Feeding Details (indicate cue type and reason): to drink from cup Grooming: Supervision/safety;Standing;Wash/dry hands;Brushing hair Grooming Details (indicate cue type and reason): no physical assist needed, benefits from cues for Rollator use and scanning to L side         Upper Body Dressing : Supervision/safety;Sitting Upper Body Dressing Details (indicate cue type and reason): some difficulty problem solving how to get shirt on with peripheral IV, cues needed Lower Body Dressing: Supervision/safety;Sit to/from stand Lower Body Dressing Details (indicate cue type and reason): no physical assist needed, cues for sequencing and continuation of task Toilet Transfer: Supervision/safety;Ambulation Toilet Transfer Details (indicate cue type and reason): using Rollator, frequent cues for brake mgmt and safe Rollator use Toileting- Clothing Manipulation and Hygiene: Supervision/safety;Sit to/from stand Toileting - Clothing Manipulation Details (indicate cue type and reason): no assist needed       General ADL Comments: Improving strength and coordination though not at baseline. Frequent cues needed for safe Rollator use as pt tending to back up with it and push it to side, minimal carryover of brake use. No putty/exercises present in room, unsure if lost on another unit or  if daughter took it home     Vision   Vision Assessment?: No apparent visual deficits    Perception     Praxis      Cognition Arousal/Alertness: Awake/alert Behavior During Therapy: WFL for tasks assessed/performed Overall Cognitive Status: Difficult to assess                                 General Comments: pleasant, able to follow directions but does need frequent cues for safety/Rollator use. english is pt's second language        Exercises General Exercises - Lower Extremity Ankle Circles/Pumps: AROM;Both;10 reps;Seated Long Arc Quad: AROM;Both;15 reps;Seated Hip Flexion/Marching: AROM;Both;10 reps;Seated Hand Exercises Opposition: Left;AROM;Seated;10 reps Other Exercises Other Exercises: BUE AROM: shoulder flexion (to 90 deg) x10 reps ea   Shoulder Instructions       General Comments      Pertinent Vitals/ Pain       Pain Assessment: Faces Pain Score: 5  Faces Pain Scale: Hurts little more Pain Location: R neck/sore throat Pain Descriptors / Indicators: Sore Pain Intervention(s): Monitored during session  Home Living                                          Prior Functioning/Environment              Frequency  Min 2X/week        Progress Toward Goals  OT Goals(current goals can now be found in the care plan section)  Progress towards OT goals: Progressing toward goals  Acute Rehab OT Goals Patient Stated Goal: to get better OT Goal Formulation: With patient Time For Goal Achievement: 11/20/20 Potential to Achieve Goals: Good ADL Goals Pt Will Perform Grooming: with supervision;standing Pt Will Perform Lower Body Dressing: with supervision;sit to/from stand Pt Will Transfer to Toilet: with modified independence Pt Will Perform Toileting - Clothing Manipulation and hygiene: with modified independence;sit to/from stand Additional ADL Goal #1: Patient or family will verbalize understanding of fine motor coordination HEP prior to discharge. Additional ADL Goal #2: Patient will demonstrate use of left  hand consistently with bilateral hand tasks during dressing.  Plan Discharge plan remains appropriate    Co-evaluation                 AM-PAC OT "6 Clicks" Daily Activity     Outcome Measure   Help from another person eating meals?: A Little Help from another person taking care of personal grooming?: A Little Help from another person toileting, which includes using toliet, bedpan, or urinal?: A Little Help from another person bathing (including washing, rinsing, drying)?: A Little Help from another person to put on and taking off regular upper body clothing?: A Little Help from another person to put on and taking off regular lower body clothing?: A Little 6 Click Score: 18    End of Session Equipment Utilized During Treatment: Other (comment) (Rollator)  OT Visit Diagnosis: Hemiplegia and hemiparesis;Muscle weakness (generalized) (M62.81);Pain Hemiplegia - Right/Left: Left Hemiplegia - dominant/non-dominant: Non-Dominant Hemiplegia - caused by: Cerebral infarction Pain - Right/Left: Left Pain - part of body: Shoulder;Arm;Hand   Activity Tolerance Patient tolerated treatment well   Patient Left in bed;with call bell/phone within reach;with bed alarm set   Nurse Communication Mobility status        Time: 3267-1245 OT  Time Calculation (min): 24 min  Charges: OT General Charges $OT Visit: 1 Visit OT Treatments $Self Care/Home Management : 23-37 mins  Malachy Chamber, OTR/L Acute Rehab Services Office: [phone]    Layla Maw 11/13/2020, 2:15 PM

## 2020-11-13 NOTE — TOC Transition Note (Signed)
Transition of Care (TOC) - CM/SW Discharge Note Marvetta Gibbons RN, BSN Transitions of Care Unit 4E- RN Case Manager See Treatment Team for direct phone #    Patient Details  Name: [name] MRN: [id number] Date of Birth: 1945-10-27  Transition of Care [facility]) CM/SW Contact:  Dawayne Patricia, RN Phone Number: 11/13/2020, 11:23 AM   Clinical Narrative:    Pt stable for transition home today w/ family. Orders placed for [facility] and DME needs per previous CM. Confirmed youth RW has been delivered to room on 3W and is in current room- however per bedside RN - PT now recommending rollator and not RW- call made to [employer] to see about exchange of DME- per Freda Munro the RW can be exchanged for pt's height with the rollators they have in stock here in house. They will have someone come up and exchange the DME prior to pt's leaving today.  Have updated Bedside RN.   Msg sent to Lattie Haw with [facility] for [facility] start of care needs.    Final next level of care: [place] Barriers to Discharge: No Barriers Identified   Patient Goals and CMS Choice Patient states their goals for this hospitalization and ongoing recovery are:: return home CMS Medicare.gov Compare Post Acute Care list provided to:: Patient Represenative (must comment) Choice offered to / list presented to : Adult Children  Discharge Placement                   Home with [facility]    Discharge Plan and Services   Discharge Planning Services: CM Consult Post Acute Care Choice: Durable Medical Equipment, Home Health          DME Arranged: Walker rolling with seat DME Agency: [employer] Date DME Agency Contacted: 11/13/20 Time DME Agency Contacted: [phone] Representative spoke with at DME Agency: Freda Munro HH Arranged: PT, OT, RN [facility] Agency: [facility] [place] Date [place]: 11/07/20   Representative spoke with at [place]: [place] ([place]) Interventions     Readmission  Risk Interventions Readmission Risk Prevention Plan 11/13/2020  Post Dischage Appt Complete  Medication Screening Complete  Transportation Screening Complete  Some recent data might be hidden

## 2020-11-13 NOTE — Progress Notes (Addendum)
[facility]  Subjective  - Comfortable, no new complaints   Objective 133/64 60 (!) 97.5 F (36.4 C) (Axillary) 13 95%  Intake/Output Summary (Last 24 hours) at 11/13/2020 [id number] Last data filed at 11/13/2020 0051 Gross per 24 hour  Intake 1200 ml  Output 525 ml  Net 675 ml    Moving all 4 ext No facial droop and no tongue deviation Dressings removed from right neck and left groin both healing well without hematoma Lungs non labored breathing  Assessment/Planning: POD # 1 TCAR for symptomatic right carotid stenosis  No frank neurologic deficits on exam this am Pending OT/PT today Incisions healing well On ASA, Plavix and Crestor  Roxy Horseman 11/13/2020 7:12 AM --  Laboratory Lab Results: Recent Labs    11/12/20 0416 11/13/20 0054  WBC 7.6 9.1  HGB 13.9 12.2  HCT 40.8 36.0  PLT 162 173   BMET Recent Labs    11/12/20 0416 11/13/20 0054  NA 137 136  K 3.9 3.8  CL 104 103  CO2 29 25  GLUCOSE 135* 155*  BUN 24* 17  CREATININE 0.80 0.71  CALCIUM 9.2 8.6*    COAG Lab Results  Component Value Date   INR 1.0 11/07/2020   INR 1.0 11/05/2020   INR 1.1 03/08/2008   No results found for: PTT  VASCULAR STAFF ADDENDUM: I have independently interviewed and examined the patient. I agree with the above.  Looks great POD#1 from [employer] Recrudescence of LUE symptoms has resolved. Back to 4/5 strength. Incisions healthy. Needs to continue ASA indefinitely. Plavix x 1 month. Statin indefinitely.  OK for discharge from our standpoint. Will follow while in house.  [name]. Stanford Breed, MD [facility] Phone Number: [phone] 11/13/2020 8:58 AM

## 2020-11-13 NOTE — Discharge Summary (Signed)
Physician Discharge Summary  [name] [id number] DOB: 11/02/1945 DOA: 11/05/2020  PCP: Lavone Orn, MD  Admit date: 11/05/2020 Discharge date: 11/13/2020 30 Day Unplanned Readmission Risk Score    Flowsheet Row ED to Hosp-Admission (Current) from 11/05/2020 in [facility] 4E CV SURGICAL PROGRESSIVE CARE  30 Day Unplanned Readmission Risk Score (%) 12.77 Filed at 11/13/2020 0801       This score is the patient's risk of an unplanned readmission within 30 days of being discharged (0 -100%). The score is based on dignosis, age, lab data, medications, orders, and past utilization.   Low:  0-14.9   Medium: 15-21.9   High: 22-29.9   Extreme: 30 and above          Admitted From: Home Disposition: Home  Recommendations for Outpatient Follow-up:  Follow up with PCP in 1-2 weeks Follow-up with neurology in 1 month. Follow-up with vascular surgery in 1 month Please obtain BMP/CBC in one week Please follow up with your PCP on the following pending results: Unresulted Labs (From admission, onward)     Start     Ordered   11/12/20 0500  Creatinine, serum  (enoxaparin (LOVENOX)    CrCl >/= 30 ml/min)  Weekly,   R     Comments: while on enoxaparin therapy    11/05/20 1954   11/12/20 0500  Magnesium  Daily,   R     Question:  Specimen collection method  Answer:  Lab=Lab collect   11/11/20 1029   11/12/20 0500  Comprehensive metabolic panel  Daily,   R     Question:  Specimen collection method  Answer:  Lab=Lab collect   11/11/20 1029   11/12/20 0500  CBC with Differential/Platelet  Daily,   R     Question:  Specimen collection method  Answer:  Lab=Lab collect   11/11/20 1029   Signed and Held  CBC  Tomorrow morning,   R       Question:  Specimen collection method  Answer:  Lab=Lab collect   Signed and Held              Home Health: Yes Equipment/Devices: None  Discharge Condition: Stable CODE STATUS: Full code Diet recommendation: Cardiac  Subjective: Seen and  examined.  Feels well.  Strength is better in left upper extremity.  Ready to go home.  Brief/Interim Summary: [name] is a 75 y.o. female with a known history of hypertension, GERD, vertigo was in a usual state of health until 1 month ago when she developed sudden onset of headache associated with left arm weakness and bilateral lower extremity weakness, She was seen by her primary care doctor who ordered an MRI as an outpatient which was done on the day of admission and showed multiple subacute strokes.  She was admitted for further work-up.  Started on aspirin and Plavix.  Seen by neurology.  Was found to have critical right-sided internal carotid artery stenosis, vascular consulted and she underwent TCAR on 11/12/2020.  Found to have elevated total cholesterol and LDL.  She was already started on Lipitor.  This caused elevated LFTs, she was transition to Crestor.  LFTs improving.  Cleared by vascular surgery for discharge.  PT OT recommends home health which has been ordered for her.  She is being discharged in stable condition with instructions to continue DAPT for total 1 month and then stop Plavix but continue aspirin indefinitely and continue statin indefinitely.  Plan of care discussed with patient.  Discharge Diagnoses:  Principal Problem:   CVA (cerebral vascular accident) ([place]) Active Problems:   GERD (gastroesophageal reflux disease)   Carotid stenosis, right   Hyperlipidemia   Protein-calorie malnutrition, severe    Discharge Instructions  Discharge Instructions     Ambulatory referral to Neurology   Complete by: As directed    Follow up with stroke clinic NP (Jessica Vanschaick or Cecille Rubin, if both not available, consider Zachery Dauer, or A[place]) at [facility] in about 4 weeks. Thanks.      Allergies as of 11/13/2020   No Known Allergies      Medication List     TAKE these medications    acetaminophen 325 MG tablet Commonly known as: TYLENOL Take 650 mg by mouth  every 6 (six) hours as needed for mild pain or headache.   amitriptyline 50 MG tablet Commonly known as: ELAVIL TAKE 1 TABLET(50 MG) BY MOUTH AT BEDTIME What changed: See the new instructions.   amLODipine 10 MG tablet Commonly known as: NORVASC Take 10 mg by mouth daily.   aspirin 81 MG EC tablet Take 1 tablet (81 mg total) by mouth daily. Swallow whole. Start taking on: November 14, 2020   clopidogrel 75 MG tablet Commonly known as: PLAVIX Take 1 tablet (75 mg total) by mouth daily. Start taking on: November 14, 2020   meclizine 12.5 MG tablet Commonly known as: ANTIVERT Take 12.5 mg by mouth 2 (two) times daily as needed for dizziness.   pantoprazole 40 MG tablet Commonly known as: PROTONIX TAKE 1 TABLET(40 MG) BY MOUTH DAILY What changed: See the new instructions.   rosuvastatin 20 MG tablet Commonly known as: CRESTOR Take 1 tablet (20 mg total) by mouth daily. Start taking on: November 14, 2020   VITAMIN D3 PO Take 1 capsule by mouth daily with breakfast.               Durable Medical Equipment  (From admission, onward)           Start     Ordered   11/07/20 1152  For home use only DME Walker rolling  Once       Comments: Youth size  Question Answer Comment  Walker: With 5 Inch Wheels   Patient needs a walker to treat with the following condition Stroke ([place])      11/07/20 1152            Follow-up Information     [facility] Follow up.   Why: The home health agency will contact you for the first home visit. Contact information: [phone]        [facility]. Schedule an appointment as soon as possible for a visit in 1 month(s).   Specialty: Neurology Why: stroke clinic Contact information: [address] [place] [phone]        Cherre Robins, MD Follow up.   Specialties: Vascular Surgery, Interventional Cardiology Why: office will call you to arrange your appt  (sent) Contact information: [place] [address] [phone]         Lavone Orn, MD Follow up in 1 week(s).   Specialty: Internal Medicine Contact information: [address] [place] [address] [phone]                No Known Allergies  Consultations: Neurology and vascular surgery   Procedures/Studies: CT ANGIO HEAD NECK W WO CM  Result Date: 11/05/2020 CLINICAL DATA:  Ataxia,  stroke suspected EXAM: CT ANGIOGRAPHY HEAD AND NECK TECHNIQUE: Multidetector CT imaging of the head and neck was performed using the standard protocol during bolus administration of intravenous contrast. Multiplanar CT image reconstructions and MIPs were obtained to evaluate the vascular anatomy. Carotid stenosis measurements (when applicable) are obtained utilizing NASCET criteria, using the distal internal carotid diameter as the denominator. CONTRAST:  36m OMNIPAQUE IOHEXOL 350 MG/ML SOLN COMPARISON:  None. FINDINGS: CT HEAD FINDINGS Brain: There is no mass, hemorrhage or extra-axial collection. The size and configuration of the ventricles and extra-axial CSF spaces are normal. There is no acute or chronic infarction. The brain parenchyma is normal. Skull: The visualized skull base, calvarium and extracranial soft tissues are normal. Sinuses/Orbits: No fluid levels or advanced mucosal thickening of the visualized paranasal sinuses. No mastoid or middle ear effusion. The orbits are normal. CTA NECK FINDINGS SKELETON: There is no bony spinal canal stenosis. No lytic or blastic lesion. OTHER NECK: Normal pharynx, larynx and major salivary glands. No cervical lymphadenopathy. Unremarkable thyroid gland. UPPER CHEST: No pneumothorax or pleural effusion. No nodules or masses. AORTIC ARCH: There is calcific atherosclerosis of the aortic arch. There is no aneurysm, dissection or hemodynamically significant stenosis of the visualized portion of the aorta. Conventional 3  vessel aortic branching pattern. The visualized proximal subclavian arteries are widely patent. RIGHT CAROTID SYSTEM: No dissection, occlusion or aneurysm. There is mixed density atherosclerosis extending into the proximal ICA, resulting in 80% stenosis. LEFT CAROTID SYSTEM: Normal without aneurysm, dissection or stenosis. VERTEBRAL ARTERIES: Left dominant configuration. Both origins are clearly patent. There is no dissection, occlusion or flow-limiting stenosis to the skull base (V1-V3 segments). CTA HEAD FINDINGS POSTERIOR CIRCULATION: --Vertebral arteries: Normal V4 segments. --Inferior cerebellar arteries: Normal. --Basilar artery: Normal. --Superior cerebellar arteries: Normal. --Posterior cerebral arteries (PCA): Normal. ANTERIOR CIRCULATION: --Intracranial internal carotid arteries: Normal. --Anterior cerebral arteries (ACA): Normal. Both A1 segments are present. Patent anterior communicating artery (a-comm). --Middle cerebral arteries (MCA): Normal. VENOUS SINUSES: As permitted by contrast timing, patent. ANATOMIC VARIANTS: None Review of the MIP images confirms the above findings. IMPRESSION: 1. No intracranial arterial occlusion or high-grade stenosis. 2. 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 Aortic Atherosclerosis (ICD10-I70.0). Electronically Signed   By: KUlyses JarredM.D.   On: 11/05/2020 19:58   DG Chest 2 View  Result Date: 11/05/2020 CLINICAL DATA:  Stroke, left arm weakness EXAM: CHEST - 2 VIEW COMPARISON:  03/24/2015 FINDINGS: The heart size and mediastinal contours are within normal limits. Both lungs are clear. The visualized skeletal structures are unremarkable. IMPRESSION: No active cardiopulmonary disease. Electronically Signed   By: AFidela SalisburyMD   On: 11/05/2020 20:32   MR BRAIN WO CONTRAST  Result Date: 11/09/2020 CLINICAL DATA:  Stroke follow-up. EXAM: MRI HEAD WITHOUT CONTRAST TECHNIQUE: Multiplanar, multiecho  pulse sequences of the brain and surrounding structures were obtained without intravenous contrast. COMPARISON:  11/05/2020 FINDINGS: Brain: Limited sequence exam per provider request, only diffusion and gradient imaging. Fading patchy restricted diffusion in the high right cerebral white matter and frontal parietal cortex. No acute hemorrhage or acute infarct. No hydrocephalus or collection. Vascular: No T2 weighted imaging Skull and upper cervical spine: Unremarkable Sinuses/Orbits: Unremarkable IMPRESSION: No acute or progressive infarct.  No interval hemorrhage. Electronically Signed   By: JMonte FantasiaM.D.   On: 11/09/2020 10:57   MR BRAIN W WO CONTRAST  Result Date: 11/05/2020 CLINICAL DATA:  Essential hypertension.  Headache. EXAM: MRI HEAD WITHOUT AND  WITH CONTRAST TECHNIQUE: Multiplanar, multiecho pulse sequences of the brain and surrounding structures were obtained without and with intravenous contrast. CONTRAST:  87m MULTIHANCE GADOBENATE DIMEGLUMINE 529 MG/ML IV SOLN COMPARISON:  06/09/2008 FINDINGS: Brain: There are multiple small regions of diffusion weighted signal abnormality in the right frontal and parietal lobes involving white matter of the centrum semiovale as well as cortex and subcortical white matter towards the vertex compatible with subacute infarcts (MCA and deep watershed territory). There is associated gyriform enhancement most notable in the parietal lobe where there is also a small focus of petechial hemorrhage. Small T2 hyperintensities elsewhere in the cerebral white matter bilaterally are nonspecific but compatible with mild chronic small vessel ischemic disease. There is mild cerebral atrophy. No mass, midline shift, or extra-axial fluid collection is identified. Vascular: Major intracranial vascular flow voids are preserved. Skull and upper cervical spine: Unremarkable bone marrow signal. Sinuses/Orbits: Bilateral cataract extraction. Paranasal sinuses and mastoid air  cells are clear. Other: None. IMPRESSION: 1. Subacute right MCA infarcts. 2. Mild chronic small vessel ischemic disease. Electronically Signed   By: ALogan BoresM.D.   On: 11/05/2020 13:36   ECHOCARDIOGRAM COMPLETE  Result Date: 11/08/2020    ECHOCARDIOGRAM REPORT   [facility] 11[facility]2020 Medical Rec #:  [id number]           Height:       59.5 in Accession #:    [id number]          Weight:       100.3 lb Date of Birth:  [birth year]          BSA:          1.384 m Patient Age:    89years             BP:           134/80 mmHg Patient Gender: F                    HR:           84 bpm. Exam Location:  Inpatient Procedure: 2D Echo, Cardiac Doppler and Color Doppler Indications:    Stroke  History:        Patient has prior history of Echocardiogram examinations, most                 recent 04/07/2018. Risk Factors:Hypertension and Dyslipidemia.                 GERD.  Sonographer:    AClayton LefortRDCS (AE) Referring Phys: [id number] HUGELMEYER  Sonographer Comments: Technically difficult study due to poor echo windows. IMPRESSIONS  1. Left ventricular ejection fraction, by estimation, is 60 to 65%. The left ventricle has normal function. The left ventricle has no regional wall motion abnormalities. Left ventricular diastolic parameters were normal.  2. Right ventricular systolic function was not well visualized. The right ventricular size is normal. There is normal pulmonary artery systolic pressure. The estimated right ventricular systolic pressure is 399991111mmHg.  3. The mitral valve is normal in structure. No evidence of mitral valve regurgitation. No evidence of mitral stenosis.  4. The aortic valve is tricuspid. Aortic valve regurgitation is trivial. Mild to moderate aortic valve sclerosis/calcification is present, without any evidence of aortic stenosis.  5. The inferior vena cava is normal in size with greater than 50% respiratory variability, suggesting right atrial  pressure of 3 mmHg.  FINDINGS  Left Ventricle: Left ventricular ejection fraction, by estimation, is 60 to 65%. The left ventricle has normal function. The left ventricle has no regional wall motion abnormalities. The left ventricular internal cavity size was normal in size. There is  no left ventricular hypertrophy. Left ventricular diastolic parameters were normal. Normal left ventricular filling pressure. Right Ventricle: The right ventricular size is normal. No increase in right ventricular wall thickness. Right ventricular systolic function was not well visualized. There is normal pulmonary artery systolic pressure. The tricuspid regurgitant velocity is  2.63 m/s, and with an assumed right atrial pressure of 3 mmHg, the estimated right ventricular systolic pressure is 99991111 mmHg. Left Atrium: Left atrial size was normal in size. Right Atrium: Right atrial size was normal in size. Pericardium: There is no evidence of pericardial effusion. Presence of pericardial fat pad. Mitral Valve: The mitral valve is normal in structure. No evidence of mitral valve regurgitation. No evidence of mitral valve stenosis. Tricuspid Valve: The tricuspid valve is normal in structure. Tricuspid valve regurgitation is mild . No evidence of tricuspid stenosis. Aortic Valve: The aortic valve is tricuspid. Aortic valve regurgitation is trivial. Mild to moderate aortic valve sclerosis/calcification is present, without any evidence of aortic stenosis. Aortic valve mean gradient measures 2.0 mmHg. Aortic valve peak  gradient measures 3.7 mmHg. Aortic valve area, by VTI measures 1.82 cm. Pulmonic Valve: The pulmonic valve was normal in structure. Pulmonic valve regurgitation is not visualized. No evidence of pulmonic stenosis. Aorta: The aortic root is normal in size and structure. Venous: The inferior vena cava is normal in size with greater than 50% respiratory variability, suggesting right atrial pressure of 3 mmHg. IAS/Shunts: There is  redundancy of the interatrial septum. The interatrial septum appears to be lipomatous. No atrial level shunt detected by color flow Doppler.  LEFT VENTRICLE PLAX 2D LVIDd:         3.00 cm  Diastology LVIDs:         1.90 cm  LV e' medial:    9.03 cm/s LV PW:         1.20 cm  LV E/e' medial:  8.9 LV IVS:        1.10 cm  LV e' lateral:   9.36 cm/s LVOT diam:     1.90 cm  LV E/e' lateral: 8.6 LV SV:         31 LV SV Index:   23 LVOT Area:     2.84 cm  RIGHT VENTRICLE             IVC RV S prime:     13.60 cm/s  IVC diam: 1.20 cm TAPSE (M-mode): 1.3 cm LEFT ATRIUM           Index       RIGHT ATRIUM          Index LA diam:      1.70 cm 1.23 cm/m  RA Area:     8.88 cm LA Vol (A4C): 16.6 ml 12.00 ml/m RA Volume:   18.50 ml 13.37 ml/m  AORTIC VALVE AV Area (Vmax):    1.89 cm AV Area (Vmean):   1.90 cm AV Area (VTI):     1.82 cm AV Vmax:           95.80 cm/s AV Vmean:          69.300 cm/s AV VTI:            0.173 m AV Peak Grad:  3.7 mmHg AV Mean Grad:      2.0 mmHg LVOT Vmax:         63.80 cm/s LVOT Vmean:        46.400 cm/s LVOT VTI:          0.111 m LVOT/AV VTI ratio: 0.64  AORTA Ao Root diam: 3.00 cm Ao Asc diam:  2.80 cm MITRAL VALVE               TRICUSPID VALVE MV Area (PHT): 7.66 cm    TR Peak grad:   27.7 mmHg MV Decel Time: 99 msec     TR Vmax:        263.00 cm/s MV E velocity: 80.70 cm/s MV A velocity: 88.00 cm/s  SHUNTS MV E/A ratio:  0.92        Systemic VTI:  0.11 m                            Systemic Diam: 1.90 cm Fransico Him MD Electronically signed by Fransico Him MD Signature Date/Time: 11/08/2020/11:36:13 AM    Final    VAS US CAROTID (at [facility] and [facility] only)  Result Date: 11/06/2020 Carotid Arterial Duplex Study Patient Name:  SHATEKA GOETTING Byron  Date of Exam:   11/06/2020 Medical Rec #: [id number]             Accession #:    [id number] Date of Birth: 1945-07-07            Patient Gender: F Patient Age:   74Y Exam Location:  [facility] Procedure:      VAS US CAROTID Referring Phys:  [id number] [place] --------------------------------------------------------------------------------  Indications:       CVA. Risk Factors:      None. Comparison Study:  11/05/2020 - CT ANGIO HEAD NECK W WO CM                    IMPRESSION:                    1. No intracranial arterial occlusion or high-grade stenosis.                    2. 80% stenosis of the proximal right internal carotid artery                    secondary to mixed density atherosclerosis. Performing Technologist: Oliver Hum RVT  Examination Guidelines: A complete evaluation includes B-mode imaging, spectral Doppler, color Doppler, and power Doppler as needed of all accessible portions of each vessel. Bilateral testing is considered an integral part of a complete examination. Limited examinations for reoccurring indications may be performed as noted.  Right Carotid Findings: +----------+--------+--------+--------+--------------------------+--------+           PSV cm/sEDV cm/sStenosisPlaque Description        Comments +----------+--------+--------+--------+--------------------------+--------+ CCA Prox  98      16              smooth and heterogenous            +----------+--------+--------+--------+--------------------------+--------+ CCA Distal39      10              smooth and heterogenous            +----------+--------+--------+--------+--------------------------+--------+ ICA Prox  374     162     80-99%  irregular and heterogenous         +----------+--------+--------+--------+--------------------------+--------+  ICA Mid   110     28              smooth and heterogenous            +----------+--------+--------+--------+--------------------------+--------+ ICA Distal62      22                                        tortuous +----------+--------+--------+--------+--------------------------+--------+ ECA       116     9                                                   +----------+--------+--------+--------+--------------------------+--------+ +----------+--------+-------+--------+-------------------+           PSV cm/sEDV cmsDescribeArm Pressure (mmHG) +----------+--------+-------+--------+-------------------+ [id number]                                         +----------+--------+-------+--------+-------------------+ +---------+--------+--+--------+--+---------+ VertebralPSV cm/s40EDV cm/s12Antegrade +---------+--------+--+--------+--+---------+  Left Carotid Findings: +----------+--------+--------+--------+-----------------------+--------+           PSV cm/sEDV cm/sStenosisPlaque Description     Comments +----------+--------+--------+--------+-----------------------+--------+ CCA Prox  70      14              smooth and heterogenous         +----------+--------+--------+--------+-----------------------+--------+ CCA Distal47      14              smooth and heterogenous         +----------+--------+--------+--------+-----------------------+--------+ ICA Prox  49      17              smooth and heterogenous         +----------+--------+--------+--------+-----------------------+--------+ ICA Distal68      24                                     tortuous +----------+--------+--------+--------+-----------------------+--------+ ECA       60      6                                               +----------+--------+--------+--------+-----------------------+--------+ +----------+--------+--------+--------+-------------------+           PSV cm/sEDV cm/sDescribeArm Pressure (mmHG) +----------+--------+--------+--------+-------------------+ [id number]                                         +----------+--------+--------+--------+-------------------+ +---------+--------+--+--------+--+---------+ VertebralPSV cm/s40EDV cm/s13Antegrade +---------+--------+--+--------+--+---------+   Summary: Right Carotid:  Velocities in the right ICA are consistent with a 80-99%                stenosis. Left Carotid: Velocities in the left ICA are consistent with a 1-39% stenosis. Vertebrals: Bilateral vertebral arteries demonstrate antegrade flow. *See table(s) above for measurements and observations.  Electronically signed by Antony Contras MD on 11/06/2020 at 1:00:26 PM.    Final    Structural Heart Procedure  Result Date: 11/12/2020 See  surgical note for result.  HYBRID OR IMAGING ([facility] ONLY)  Result Date: 11/12/2020 There is no interpretation for this exam.  This order is for images obtained during a surgical procedure.  Please See "Surgeries" Tab for more information regarding the procedure.   US Abdomen Limited RUQ (LIVER/GB)  Result Date: 11/10/2020 CLINICAL DATA:  Transaminitis. EXAM: ULTRASOUND ABDOMEN LIMITED RIGHT UPPER QUADRANT COMPARISON:  March 05, 2014. FINDINGS: Gallbladder: Status post cholecystectomy. Common bile duct: Diameter: 8 mm which is within normal limits given post cholecystectomy status. Liver: No focal lesion identified. Within normal limits in parenchymal echogenicity. Portal vein is patent on color Doppler imaging with normal direction of blood flow towards the liver. Other: None. IMPRESSION: Status post cholecystectomy. No other abnormality seen in the right upper quadrant of the abdomen. Electronically Signed   By: Marijo Conception M.D.   On: 11/10/2020 10:57     Discharge Exam: Vitals:   11/13/20 0843 11/13/20 1000  BP: (!) 161/72 (!) 157/67  Pulse: 78 78  Resp: 19 19  Temp: 97.6 F (36.4 C)   SpO2: 95% 99%   Vitals:   11/12/20 2312 11/13/20 0353 11/13/20 0843 11/13/20 1000  BP: 134/63 133/64 (!) 161/72 (!) 157/67  Pulse: 75 60 78 78  Resp: '16 13 19 19  '$ Temp: 97.8 F (36.6 C) (!) 97.5 F (36.4 C) 97.6 F (36.4 C)   TempSrc: Oral Axillary Oral   SpO2: 97% 95% 95% 99%  Weight:      Height:        General: Pt is alert, awake, not in acute distress Cardiovascular:  RRR, S1/S2 +, no rubs, no gallops Respiratory: CTA bilaterally, no wheezing, no rhonchi Abdominal: Soft, NT, ND, bowel sounds + Extremities: no edema, no cyanosis.  5/5 power in right upper extremity and bilateral lower extremities, 4/5 power in left upper extremity.  No dysarthria.    The results of significant diagnostics from this hospitalization (including imaging, microbiology, ancillary and laboratory) are listed below for reference.     Microbiology: Recent Results (from the past 240 hour(s))  Resp Panel by RT-PCR (Flu A&B, Covid) Nasopharyngeal Swab     Status: None   Collection Time: 11/05/20  5:48 PM   Specimen: Nasopharyngeal Swab; Nasopharyngeal(NP) swabs in vial transport medium  Result Value Ref Range Status   SARS Coronavirus 2 by RT PCR NEGATIVE NEGATIVE Final    Comment: (NOTE) SARS-CoV-2 target nucleic acids are NOT DETECTED.  The SARS-CoV-2 RNA is generally detectable in upper respiratory specimens during the acute phase of infection. The lowest concentration of SARS-CoV-2 viral copies this assay can detect is 138 copies/mL. A negative result does not preclude SARS-Cov-2 infection and should not be used as the sole basis for treatment or other patient management decisions. A negative result may occur with  improper specimen collection/handling, submission of specimen other than nasopharyngeal swab, presence of viral mutation(s) within the areas targeted by this assay, and inadequate number of viral copies(<138 copies/mL). A negative result must be combined with clinical observations, patient history, and epidemiological information. The expected result is Negative.  Fact Sheet for Patients:  EntrepreneurPulse.com.au  Fact Sheet for Healthcare Providers:  IncredibleEmployment.be  This test is no t yet approved or cleared by the [place] FDA and  has been authorized for detection and/or diagnosis of SARS-CoV-2 by FDA  under an Emergency Use Authorization (EUA). This EUA will remain  in effect (meaning this test can be used) for the duration of the COVID-19 declaration under  Section 564(b)(1) of the Act, 21 U.S.C.section 360bbb-3(b)(1), unless the authorization is terminated  or revoked sooner.       Influenza A by PCR NEGATIVE NEGATIVE Final   Influenza B by PCR NEGATIVE NEGATIVE Final    Comment: (NOTE) The Xpert Xpress SARS-CoV-2/FLU/RSV plus assay is intended as an aid in the diagnosis of influenza from Nasopharyngeal swab specimens and should not be used as a sole basis for treatment. Nasal washings and aspirates are unacceptable for Xpert Xpress SARS-CoV-2/FLU/RSV testing.  Fact Sheet for Patients: EntrepreneurPulse.com.au  Fact Sheet for Healthcare Providers: IncredibleEmployment.be  This test is not yet approved or cleared by the [place] FDA and has been authorized for detection and/or diagnosis of SARS-CoV-2 by FDA under an Emergency Use Authorization (EUA). This EUA will remain in effect (meaning this test can be used) for the duration of the COVID-19 declaration under Section 564(b)(1) of the Act, 21 U.S.C. section 360bbb-3(b)(1), unless the authorization is terminated or revoked.  Performed at [facility], [place] [address] [place], [place] [address]   Surgical PCR screen     Status: None   Collection Time: 11/11/20 10:17 PM   Specimen: Nasal Mucosa; Nasal Swab  Result Value Ref Range Status   MRSA, PCR NEGATIVE NEGATIVE Final   Staphylococcus aureus NEGATIVE NEGATIVE Final    Comment: (NOTE) The Xpert SA Assay (FDA approved for NASAL specimens in patients 54 years of age and older), is one component of a comprehensive surveillance program. It is not intended to diagnose infection nor to guide or monitor treatment. Performed at [facility], [place] [address] [place], [place] [address]      Labs: BNP (last  3 results) No results for input(s): BNP in the last 8760 hours. Basic Metabolic Panel: Recent Labs  Lab 11/08/20 0330 11/09/20 0136 11/10/20 0408 11/11/20 0026 11/12/20 0416 11/13/20 0054  NA 140 141 142 140 137 136  K 3.8 4.0 4.0 4.8 3.9 3.8  CL 106 107 103 103 104 103  CO2 '26 25 26 '$ 33* 29 25  GLUCOSE 116* 133* 112* 137* 135* 155*  BUN 29* 27* 16 21 24* 17  CREATININE 0.97 0.81 0.69 0.95 0.80 0.71  CALCIUM 10.0 9.4 9.8 9.7 9.2 8.6*  MG 1.8 1.8 1.8  --  1.9 1.9   Liver Function Tests: Recent Labs  Lab 11/09/20 0136 11/10/20 0408 11/11/20 0026 11/12/20 0416 11/13/20 0054  AST 65* 72* 88* 172* 121*  ALT 48* 69* 84* 175* 165*  ALKPHOS 69 70 82 153* 161*  BILITOT 0.8 0.9 0.8 1.0 0.7  PROT 5.7* 6.2* 6.1* 6.1* 5.7*  ALBUMIN 3.2* 3.5 3.3* 3.3* 3.1*   No results for input(s): LIPASE, AMYLASE in the last 168 hours. No results for input(s): AMMONIA in the last 168 hours. CBC: Recent Labs  Lab 11/09/20 0136 11/10/20 0408 11/11/20 0026 11/12/20 0416 11/13/20 0054  WBC 5.4 5.4 7.5 7.6 9.1  NEUTROABS 3.4 3.5 5.4 5.9 8.0*  HGB 14.1 14.4 14.3 13.9 12.2  HCT 41.3 43.3 43.0 40.8 36.0  MCV 86.0 87.1 88.3 87.0 86.3  PLT 177 188 188 162 173   Cardiac Enzymes: No results for input(s): CKTOTAL, CKMB, CKMBINDEX, TROPONINI in the last 168 hours. BNP: Invalid input(s): POCBNP CBG: Recent Labs  Lab 11/12/20 0923 11/12/20 1204 11/12/20 1622  GLUCAP 114* 127* 254*   D-Dimer No results for input(s): DDIMER in the last 72 hours. Hgb A1c No results for input(s): HGBA1C in the last 72 hours. Lipid  Profile Recent Labs    11/13/20 0054  CHOL 130  HDL 53  LDLCALC 55  TRIG 109  CHOLHDL 2.5   Thyroid function studies No results for input(s): TSH, T4TOTAL, T3FREE, THYROIDAB in the last 72 hours.  Invalid input(s): FREET3 Anemia work up No results for input(s): VITAMINB12, FOLATE, FERRITIN, TIBC, IRON, RETICCTPCT in the last 72 hours. Urinalysis    Component Value  Date/Time   COLORURINE STRAW (A) 11/05/2020 1930   APPEARANCEUR CLEAR 11/05/2020 1930   LABSPEC 1.008 11/05/2020 1930   PHURINE 6.0 11/05/2020 1930   GLUCOSEU NEGATIVE 11/05/2020 1930   HGBUR NEGATIVE 11/05/2020 1930   BILIRUBINUR NEGATIVE 11/05/2020 1930   KETONESUR NEGATIVE 11/05/2020 1930   PROTEINUR NEGATIVE 11/05/2020 1930   UROBILINOGEN 0.2 11/12/2010 2338   NITRITE NEGATIVE 11/05/2020 1930   LEUKOCYTESUR NEGATIVE 11/05/2020 1930   Sepsis Labs Invalid input(s): PROCALCITONIN,  WBC,  LACTICIDVEN Microbiology Recent Results (from the past 240 hour(s))  Resp Panel by RT-PCR (Flu A&B, Covid) Nasopharyngeal Swab     Status: None   Collection Time: 11/05/20  5:48 PM   Specimen: Nasopharyngeal Swab; Nasopharyngeal(NP) swabs in vial transport medium  Result Value Ref Range Status   SARS Coronavirus 2 by RT PCR NEGATIVE NEGATIVE Final    Comment: (NOTE) SARS-CoV-2 target nucleic acids are NOT DETECTED.  The SARS-CoV-2 RNA is generally detectable in upper respiratory specimens during the acute phase of infection. The lowest concentration of SARS-CoV-2 viral copies this assay can detect is 138 copies/mL. A negative result does not preclude SARS-Cov-2 infection and should not be used as the sole basis for treatment or other patient management decisions. A negative result may occur with  improper specimen collection/handling, submission of specimen other than nasopharyngeal swab, presence of viral mutation(s) within the areas targeted by this assay, and inadequate number of viral copies(<138 copies/mL). A negative result must be combined with clinical observations, patient history, and epidemiological information. The expected result is Negative.  Fact Sheet for Patients:  EntrepreneurPulse.com.au  Fact Sheet for Healthcare Providers:  IncredibleEmployment.be  This test is no t yet approved or cleared by the [place] FDA and  has been  authorized for detection and/or diagnosis of SARS-CoV-2 by FDA under an Emergency Use Authorization (EUA). This EUA will remain  in effect (meaning this test can be used) for the duration of the COVID-19 declaration under Section 564(b)(1) of the Act, 21 U.S.C.section 360bbb-3(b)(1), unless the authorization is terminated  or revoked sooner.       Influenza A by PCR NEGATIVE NEGATIVE Final   Influenza B by PCR NEGATIVE NEGATIVE Final    Comment: (NOTE) The Xpert Xpress SARS-CoV-2/FLU/RSV plus assay is intended as an aid in the diagnosis of influenza from Nasopharyngeal swab specimens and should not be used as a sole basis for treatment. Nasal washings and aspirates are unacceptable for Xpert Xpress SARS-CoV-2/FLU/RSV testing.  Fact Sheet for Patients: EntrepreneurPulse.com.au  Fact Sheet for Healthcare Providers: IncredibleEmployment.be  This test is not yet approved or cleared by the [place] FDA and has been authorized for detection and/or diagnosis of SARS-CoV-2 by FDA under an Emergency Use Authorization (EUA). This EUA will remain in effect (meaning this test can be used) for the duration of the COVID-19 declaration under Section 564(b)(1) of the Act, 21 U.S.C. section 360bbb-3(b)(1), unless the authorization is terminated or revoked.  Performed at [facility], [place] [address] [place],  [address]   Surgical PCR screen     Status: None  Collection Time: 11/11/20 10:17 PM   Specimen: Nasal Mucosa; Nasal Swab  Result Value Ref Range Status   MRSA, PCR NEGATIVE NEGATIVE Final   Staphylococcus aureus NEGATIVE NEGATIVE Final    Comment: (NOTE) The Xpert SA Assay (FDA approved for NASAL specimens in patients 46 years of age and older), is one component of a comprehensive surveillance program. It is not intended to diagnose infection nor to guide or monitor treatment. Performed at [facility],  [place] [address] [place], [place] [address]      Time coordinating discharge: Over 30 minutes  SIGNED:   Darliss Cheney, MD  [facility] 11/13/2020, 10:54 AM  If 7PM-7AM, please contact night-coverage [email or web address]

## 2020-11-17 ENCOUNTER — Emergency Department (HOSPITAL_COMMUNITY): Payer: Medicare Other

## 2020-11-17 ENCOUNTER — Other Ambulatory Visit: Payer: Self-pay

## 2020-11-17 ENCOUNTER — Observation Stay (HOSPITAL_COMMUNITY)
Admission: EM | Admit: 2020-11-17 | Discharge: 2020-11-18 | Disposition: A | Discharge status: Home / Self Care | Payer: Medicare Other | Attending: Vascular Surgery | Admitting: Vascular Surgery

## 2020-11-17 DIAGNOSIS — L309 Dermatitis, unspecified: Secondary | ICD-10-CM

## 2020-11-17 DIAGNOSIS — R0600 Dyspnea, unspecified: Secondary | ICD-10-CM | POA: Diagnosis present

## 2020-11-17 DIAGNOSIS — I10 Essential (primary) hypertension: Secondary | ICD-10-CM | POA: Insufficient documentation

## 2020-11-17 DIAGNOSIS — Z20822 Contact with and (suspected) exposure to covid-19: Secondary | ICD-10-CM | POA: Diagnosis not present

## 2020-11-17 DIAGNOSIS — R0602 Shortness of breath: Secondary | ICD-10-CM | POA: Diagnosis not present

## 2020-11-17 DIAGNOSIS — Z87891 Personal history of nicotine dependence: Secondary | ICD-10-CM | POA: Insufficient documentation

## 2020-11-17 DIAGNOSIS — Z8673 Personal history of transient ischemic attack (TIA), and cerebral infarction without residual deficits: Secondary | ICD-10-CM | POA: Insufficient documentation

## 2020-11-17 DIAGNOSIS — Z7902 Long term (current) use of antithrombotics/antiplatelets: Secondary | ICD-10-CM | POA: Diagnosis not present

## 2020-11-17 DIAGNOSIS — Z79899 Other long term (current) drug therapy: Secondary | ICD-10-CM | POA: Insufficient documentation

## 2020-11-17 DIAGNOSIS — Z7982 Long term (current) use of aspirin: Secondary | ICD-10-CM | POA: Insufficient documentation

## 2020-11-17 LAB — CBC WITH DIFFERENTIAL/PLATELET
Abs Immature Granulocytes: 0.05 10*3/uL (ref 0.00–0.07)
Basophils Absolute: 0 10*3/uL (ref 0.0–0.1)
Basophils Relative: 0 %
Eosinophils Absolute: 0.2 10*3/uL (ref 0.0–0.5)
Eosinophils Relative: 3 %
HCT: 38 % (ref 36.0–46.0)
Hemoglobin: 12.7 g/dL (ref 12.0–15.0)
Immature Granulocytes: 1 %
Lymphocytes Relative: 9 %
Lymphs Abs: 0.7 10*3/uL (ref 0.7–4.0)
MCH: 29.4 pg (ref 26.0–34.0)
MCHC: 33.4 g/dL (ref 30.0–36.0)
MCV: 88 fL (ref 80.0–100.0)
Monocytes Absolute: 0.7 10*3/uL (ref 0.1–1.0)
Monocytes Relative: 8 %
Neutro Abs: 6.4 10*3/uL (ref 1.7–7.7)
Neutrophils Relative %: 79 %
Platelets: 218 10*3/uL (ref 150–400)
RBC: 4.32 MIL/uL (ref 3.87–5.11)
RDW: 11.7 % (ref 11.5–15.5)
WBC: 8 10*3/uL (ref 4.0–10.5)
nRBC: 0 % (ref 0.0–0.2)

## 2020-11-17 LAB — RESP PANEL BY RT-PCR (FLU A&B, COVID) ARPGX2
Influenza A by PCR: NEGATIVE
Influenza B by PCR: NEGATIVE
SARS Coronavirus 2 by RT PCR: NEGATIVE

## 2020-11-17 LAB — BASIC METABOLIC PANEL
Anion gap: 9 (ref 5–15)
BUN: 11 mg/dL (ref 8–23)
CO2: 27 mmol/L (ref 22–32)
Calcium: 9.4 mg/dL (ref 8.9–10.3)
Chloride: 104 mmol/L (ref 98–111)
Creatinine, Ser: 0.68 mg/dL (ref 0.44–1.00)
GFR, Estimated: >60 mL/min (ref >60)
Glucose, Bld: 137 mg/dL — ABNORMAL HIGH (ref 70–99)
Potassium: 4.2 mmol/L (ref 3.5–5.1)
Sodium: 140 mmol/L (ref 135–145)

## 2020-11-17 LAB — TROPONIN I (HIGH SENSITIVITY)
Troponin I (High Sensitivity): 7 ng/L (ref <18)
Troponin I (High Sensitivity): 6 ng/L (ref <18)

## 2020-11-17 LAB — BRAIN NATRIURETIC PEPTIDE: B Natriuretic Peptide: 31.4 pg/mL (ref 0.0–100.0)

## 2020-11-17 MED ORDER — SODIUM CHLORIDE 0.9% FLUSH: 3.0000 mL | INTRAVENOUS | Status: DC | PRN

## 2020-11-17 MED ORDER — SODIUM CHLORIDE 0.9 % IV SOLN: 250.0000 mL | INTRAVENOUS | Status: DC | PRN

## 2020-11-17 MED ORDER — ALUM & MAG HYDROXIDE-SIMETH 200-200-20 MG/5ML PO SUSP: 15.0000 mL | ORAL | Status: DC | PRN

## 2020-11-17 MED ORDER — CLOPIDOGREL BISULFATE 75 MG PO TABS
75.0000 mg | ORAL_TABLET | Freq: Every day | ORAL | Status: DC
  Administered 2020-11-17 – 2020-11-18 (×2): 75 mg via ORAL
  Filled 2020-11-17 (×2): qty 1

## 2020-11-17 MED ORDER — PANTOPRAZOLE SODIUM 40 MG PO TBEC: 40.0000 mg | DELAYED_RELEASE_TABLET | Freq: Every day | ORAL | Status: DC

## 2020-11-17 MED ORDER — PHENOL 1.4 % MT LIQD
1.0000 | OROMUCOSAL | Status: DC | PRN
  Filled 2020-11-17: qty 177

## 2020-11-17 MED ORDER — MECLIZINE HCL 25 MG PO TABS: 12.5000 mg | ORAL_TABLET | Freq: Two times a day (BID) | ORAL | Status: DC | PRN

## 2020-11-17 MED ORDER — ASPIRIN EC 81 MG PO TBEC
81.0000 mg | DELAYED_RELEASE_TABLET | Freq: Every day | ORAL | Status: DC
  Administered 2020-11-17 – 2020-11-18 (×2): 81 mg via ORAL
  Filled 2020-11-17 (×2): qty 1

## 2020-11-17 MED ORDER — GUAIFENESIN-DM 100-10 MG/5ML PO SYRP: 15.0000 mL | ORAL_SOLUTION | ORAL | Status: DC | PRN

## 2020-11-17 MED ORDER — OXYCODONE HCL 5 MG PO TABS: 5.0000 mg | ORAL_TABLET | ORAL | Status: DC | PRN

## 2020-11-17 MED ORDER — ROSUVASTATIN CALCIUM 20 MG PO TABS
20.0000 mg | ORAL_TABLET | Freq: Every day | ORAL | Status: DC
  Administered 2020-11-17 – 2020-11-18 (×2): 20 mg via ORAL
  Filled 2020-11-17 (×2): qty 1

## 2020-11-17 MED ORDER — DIPHENHYDRAMINE HCL 25 MG PO CAPS: 25.0000 mg | ORAL_CAPSULE | Freq: Four times a day (QID) | ORAL | Status: DC | PRN

## 2020-11-17 MED ORDER — AMLODIPINE BESYLATE 5 MG PO TABS
10.0000 mg | ORAL_TABLET | Freq: Every day | ORAL | Status: DC
  Administered 2020-11-17 – 2020-11-18 (×2): 10 mg via ORAL
  Filled 2020-11-17 (×2): qty 2

## 2020-11-17 MED ORDER — DOCUSATE SODIUM 100 MG PO CAPS
100.0000 mg | ORAL_CAPSULE | Freq: Two times a day (BID) | ORAL | Status: DC
  Administered 2020-11-17 – 2020-11-18 (×2): 100 mg via ORAL
  Filled 2020-11-17 (×3): qty 1

## 2020-11-17 MED ORDER — SODIUM CHLORIDE 0.9% FLUSH
3.0000 mL | Freq: Two times a day (BID) | INTRAVENOUS | Status: DC
  Administered 2020-11-17: 3 mL via INTRAVENOUS

## 2020-11-17 MED ORDER — HYDRALAZINE HCL 20 MG/ML IJ SOLN
5.0000 mg | INTRAMUSCULAR | Status: DC | PRN
Start: 2020-11-17 — End: 2020-11-18

## 2020-11-17 MED ORDER — ACETAMINOPHEN 325 MG PO TABS: 325.0000 mg | ORAL_TABLET | ORAL | Status: DC | PRN

## 2020-11-17 MED ORDER — POTASSIUM CHLORIDE CRYS ER 20 MEQ PO TBCR
20.0000 meq | EXTENDED_RELEASE_TABLET | Freq: Once | ORAL | Status: DC
  Filled 2020-11-17: qty 1

## 2020-11-17 MED ORDER — PANTOPRAZOLE SODIUM 40 MG PO TBEC
40.0000 mg | DELAYED_RELEASE_TABLET | Freq: Every day | ORAL | Status: DC
  Administered 2020-11-17 – 2020-11-18 (×2): 40 mg via ORAL
  Filled 2020-11-17 (×2): qty 1

## 2020-11-17 MED ORDER — AMITRIPTYLINE HCL 50 MG PO TABS
50.0000 mg | ORAL_TABLET | Freq: Every evening | ORAL | Status: DC | PRN
  Filled 2020-11-17: qty 1

## 2020-11-17 MED ORDER — ACETAMINOPHEN 325 MG RE SUPP: 325.0000 mg | RECTAL | Status: DC | PRN

## 2020-11-17 MED ORDER — METOPROLOL TARTRATE 5 MG/5ML IV SOLN: 2.0000 mg | INTRAVENOUS | Status: DC | PRN

## 2020-11-17 MED ORDER — DIPHENHYDRAMINE HCL 25 MG PO CAPS
25.0000 mg | ORAL_CAPSULE | ORAL | Status: DC
  Filled 2020-11-17: qty 1

## 2020-11-17 MED ORDER — IOHEXOL 350 MG/ML SOLN
100.0000 mL | Freq: Once | INTRAVENOUS | Status: AC | PRN
  Administered 2020-11-17: 100 mL via INTRAVENOUS

## 2020-11-17 MED ORDER — ONDANSETRON HCL 4 MG/2ML IJ SOLN
4.0000 mg | Freq: Four times a day (QID) | INTRAMUSCULAR | Status: DC | PRN
Start: 2020-11-17 — End: 2020-11-18

## 2020-11-17 MED ORDER — MORPHINE SULFATE (PF) 2 MG/ML IV SOLN: 2.0000 mg | INTRAVENOUS | Status: DC | PRN

## 2020-11-17 MED ORDER — LABETALOL HCL 5 MG/ML IV SOLN
10.0000 mg | INTRAVENOUS | Status: DC | PRN
Start: 2020-11-17 — End: 2020-11-18

## 2020-11-17 NOTE — ED Notes (Signed)
Patient transported to CT 

## 2020-11-17 NOTE — ED Provider Notes (Signed)
Care transferred to me.  Patient recently had right carotid artery surgery.  This was about 5 days ago.  Starting 2 days ago has had progressive worsening dyspnea as well as neck/facial swelling.  Better with sitting up.  Maybe a little bit of cough.  Is tachypneic here though no significant abnormal lung sounds on my exam.  CT shows a postoperative fluid collection.  Vascular surgery to admit.   Sherwood Gambler, MD 11/17/20 1538

## 2020-11-17 NOTE — H&P (Signed)
Patient name: [name] MRN: [id number] DOB: 1946-01-28 Sex: female   HPI: [name] Yanna Mouton is a 75 y.o. female, status post right carotid TCAR stenting last week.  The patient was discharged to home on July 28.  She states that after arriving home she just did not feel well.  She began developing some swelling and itching on the right side of her neck.  On direct questioning that is difficult to know whether or not she actually has shortness of breath or complaints more of the swelling.  She does not appear to clinically be in respiratory distress.  She does not really have any focal neurologic findings.  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   GERD (gastroesophageal reflux disease)    Hypertension    Internal hemorrhoids    Vertigo    Past Surgical History:  Procedure Laterality Date   BREAST BIOPSY Right 2009   CHOLECYSTECTOMY     COLONOSCOPY     ERCP  01/21/2012   Procedure: ENDOSCOPIC RETROGRADE CHOLANGIOPANCREATOGRAPHY (ERCP);  Surgeon: Gatha Mayer, MD;  Location: [facility];  Service: Endoscopy;  Laterality: N/A;  Needs interpreter- vietnanese   ESOPHAGOGASTRODUODENOSCOPY     gist resection     TRANSCAROTID ARTERY REVASCULARIZATION  Right 11/12/2020   Procedure: RIGHT TRANSCAROTID ARTERY REVASCULARIZATION;  Surgeon: Cherre Robins, MD;  Location: [facility] OR;  Service: Vascular;  Laterality: Right;    Family History  Problem Relation Age of Onset   Colon cancer Neg Hx     SOCIAL HISTORY: Social History   Socioeconomic History   Marital status: Widowed    Spouse name: Not on file   Number of children: 1   Years of education: Not on file   Highest education level: Not on file  Occupational History   Occupation: UNEMPLOYED    Employer: UNEMPLOYED  Tobacco Use   Smoking  status: Former    Packs/day: 0.50    Years: 40.00    Pack years: 20.00    Types: Cigarettes    Quit date: 04/07/2003    Years since quitting: 17.6   Smokeless tobacco: Never  Vaping Use   Vaping Use: Never used  Substance and Sexual Activity   Alcohol use: No   Drug use: No   Sexual activity: Not on file  Other Topics Concern   Not on file  Social History Narrative   Widowed   1 child   Former smoker   No EtOH/drugs      Social Determinants of Radio broadcast assistant Strain: Not on file  Food Insecurity: Not on file  Transportation Needs: Not on file  Physical Activity: Not on file  Stress: Not on file  Social Connections: Not on file  Intimate Partner Violence: Not on file    No Known Allergies  Current Facility-Administered Medications  Medication Dose Route Frequency Provider Last Rate Last Admin   0.9 %  sodium chloride infusion  250 mL Intravenous PRN Elam Dutch, MD       acetaminophen (TYLENOL) tablet 325-650 mg  325-650 mg Oral Q4H PRN Elam Dutch, MD       Or   acetaminophen (TYLENOL) suppository 325-650 mg  325-650 mg Rectal Q4H PRN Elam Dutch, MD  alum & mag hydroxide-simeth (MAALOX/MYLANTA) 200-200-20 MG/5ML suspension 15-30 mL  15-30 mL Oral Q2H PRN Braylea Brancato, Jessy Oto, MD       amitriptyline (ELAVIL) tablet 50 mg  50 mg Oral QHS PRN Elam Dutch, MD       amLODipine (NORVASC) tablet 10 mg  10 mg Oral Daily Elam Dutch, MD       aspirin EC tablet 81 mg  81 mg Oral Daily Sunaina Ferrando, Jessy Oto, MD       clopidogrel (PLAVIX) tablet 75 mg  75 mg Oral Daily Ryszard Socarras, Jessy Oto, MD       diphenhydrAMINE (BENADRYL) capsule 25 mg  25 mg Oral NOW Bayler Gehrig, Jessy Oto, MD       diphenhydrAMINE (BENADRYL) capsule 25 mg  25 mg Oral Q6H PRN Elam Dutch, MD       docusate sodium (COLACE) capsule 100 mg  100 mg Oral BID Elam Dutch, MD       guaiFENesin-dextromethorphan (ROBITUSSIN DM) 100-10 MG/5ML syrup 15 mL  15 mL Oral Q4H  PRN Elam Dutch, MD       hydrALAZINE (APRESOLINE) injection 5 mg  5 mg Intravenous Q20 Min PRN Elam Dutch, MD       labetalol (NORMODYNE) injection 10 mg  10 mg Intravenous Q10 min PRN Elam Dutch, MD       meclizine (ANTIVERT) tablet 12.5 mg  12.5 mg Oral BID PRN Elam Dutch, MD       metoprolol tartrate (LOPRESSOR) injection 2-5 mg  2-5 mg Intravenous Q2H PRN Elam Dutch, MD       morphine 2 MG/ML injection 2-5 mg  2-5 mg Intravenous Q1H PRN Elam Dutch, MD       ondansetron [facility]) injection 4 mg  4 mg Intravenous Q6H PRN Elam Dutch, MD       oxyCODONE (Oxy IR/ROXICODONE) immediate release tablet 5-10 mg  5-10 mg Oral Q4H PRN Elam Dutch, MD       pantoprazole (PROTONIX) EC tablet 40 mg  40 mg Oral Daily Deeksha Cotrell, Jessy Oto, MD       pantoprazole (PROTONIX) EC tablet 40 mg  40 mg Oral Daily Tequita Marrs, Jessy Oto, MD       phenol (CHLORASEPTIC) mouth spray 1 spray  1 spray Mouth/Throat PRN Bronda Alfred, Jessy Oto, MD       potassium chloride SA (KLOR-CON) CR tablet 20-40 mEq  20-40 mEq Oral Once Elam Dutch, MD       rosuvastatin (CRESTOR) tablet 20 mg  20 mg Oral Daily Hareem Surowiec, Jessy Oto, MD       sodium chloride flush (NS) 0.9 % injection 3 mL  3 mL Intravenous Q12H Micaiah Litle, Jessy Oto, MD       sodium chloride flush (NS) 0.9 % injection 3 mL  3 mL Intravenous PRN Elam Dutch, MD       Current Outpatient Medications  Medication Sig Dispense Refill   acetaminophen (TYLENOL) 325 MG tablet Take 650 mg by mouth every 6 (six) hours as needed for mild pain or headache.     amitriptyline (ELAVIL) 50 MG tablet TAKE 1 TABLET(50 MG) BY MOUTH AT BEDTIME 90 tablet 3   amLODipine (NORVASC) 10 MG tablet Take 10 mg by mouth daily.       aspirin EC 81 MG EC tablet Take 1 tablet (81 mg total) by mouth daily. Swallow whole. 30 tablet 11   Cholecalciferol (VITAMIN D3 PO) Take 1  capsule by mouth daily with breakfast.     clopidogrel (PLAVIX) 75 MG tablet Take 1  tablet (75 mg total) by mouth daily. 30 tablet 0   meclizine (ANTIVERT) 12.5 MG tablet Take 12.5 mg by mouth 2 (two) times daily as needed for dizziness.     pantoprazole (PROTONIX) 40 MG tablet TAKE 1 TABLET(40 MG) BY MOUTH DAILY 90 tablet 3   rosuvastatin (CRESTOR) 20 MG tablet Take 1 tablet (20 mg total) by mouth daily. 30 tablet 0    ROS:   General:  No weight loss, Fever, chills  HEENT: No recent headaches, no nasal bleeding, no visual changes, no sore throat  Neurologic: No dizziness, blackouts, seizures. No recent symptoms of stroke or mini- stroke. No recent episodes of slurred speech, or temporary blindness.  Cardiac: No recent episodes of chest pain/pressure, ? shortness of breath at rest.  No shortness of breath with exertion.  Denies history of atrial fibrillation or irregular heartbeat  Vascular: No history of rest pain in feet.  No history of claudication.  No history of non-healing ulcer, No history of DVT   Pulmonary: No home oxygen, no productive cough, no hemoptysis,  No asthma or wheezing  Musculoskeletal:  '[ ]'$  Arthritis, '[ ]'$  Low back pain,  '[ ]'$  Joint pain  Hematologic:No history of hypercoagulable state.  No history of easy bleeding.  No history of anemia  Gastrointestinal: No hematochezia or melena,  No gastroesophageal reflux, no trouble swallowing  Urinary: '[ ]'$  chronic Kidney disease, '[ ]'$  on HD - '[ ]'$  MWF or '[ ]'$  TTHS, '[ ]'$  Burning with urination, '[ ]'$  Frequent urination, '[ ]'$  Difficulty urinating;   Skin: No rashes  Psychological: No history of anxiety,  No history of depression   Physical Examination  Vitals:   11/17/20 0650 11/17/20 0715 11/17/20 0800 11/17/20 0915  BP: 133/60 140/70 132/79 127/73  Pulse: 89 92 96 90  Resp: (!) '23 18 20 '$ (!) 21  Temp:      TempSrc:      SpO2: 92% 98% 100% 95%  Weight:      Height:        Body mass index is 20.26 kg/m.  General:  Alert and oriented, no acute distress HEENT: Normal Neck: No JVD, hive-like rash  right neck with multiple areas of macular erythematous nodules, healing neck incision with some ecchymosis on the right chest wall no focal hematoma Pulmonary: Clear to auscultation bilaterally Cardiac: Regular Rate and Rhythm  Skin: See above Musculoskeletal: No deformity or edema  Neurologic: Upper and lower extremity motor 5/5 and symmetric, no facial asymmetry no obvious difficulty swallowing  DATA:  CT angio of the neck and head are reviewed.  Carotid stent is widely patent.  There is a small hematoma 2 x 4 cm at the base of the right neck.  This does not appear to be causing any significant compression.  No obvious stroke.  ASSESSMENT: Right neck rash most likely secondary from [place] draping.  Patient feels overall body weakness and possibly some shortness of breath.   PLAN: We will admit for 23-hour observation.  We will give Benadryl now to see if we can improve the rash.   Ruta Hinds, MD [facility] Office: [phone]

## 2020-11-17 NOTE — ED Notes (Signed)
Hooked patient back up to the monitor patient is resting with call bel in reach

## 2020-11-17 NOTE — ED Notes (Signed)
Called [facility] to ask about COVID swab.  Tech reports they have not received the swab.  Sts she will look for it.

## 2020-11-17 NOTE — ED Provider Notes (Signed)
Emergency Medicine Provider Triage Evaluation Note  [name] , a 75 y.o. female  was evaluated in triage.  Pt complains of shortness of breath.  Had recent ICA stent placement.  Reports shortness of breath started today.  Denies fevers or chills.  Accompanied by grandson, who reports tachypnea.  Review of Systems  Positive: SOB, bruising Negative: Fever, cough  Physical Exam  BP (!) 149/69 (BP Location: Left Arm)   Pulse (!) 105   Temp 98.7 F (37.1 C) (Oral)   Resp 18   SpO2 99%  Gen:   Awake, no distress   Resp:  Tachypnea MSK:   Moves extremities without difficulty  Other:    Medical Decision Making  Medically screening exam initiated at 1:27 AM.  Appropriate orders placed.  [name] was informed that the remainder of the evaluation will be completed by another provider, this initial triage assessment does not replace that evaluation, and the importance of remaining in the ED until their evaluation is complete.  SOB   Montine Circle, PA-C 11/17/20 0128    Orpah Greek, MD 11/17/20 [phone]

## 2020-11-17 NOTE — ED Triage Notes (Signed)
Pt from home.  Had carotid stent placed on the right on Wednesday.  Discharged home and was doing fine until today with increased shob especially with activity.  Incision with steri strips does not have any outward signs of infection at time of triage.

## 2020-11-17 NOTE — ED Notes (Signed)
Up to b/r, steady gait, alert, NAD, calm, interactive.  

## 2020-11-17 NOTE — ED Notes (Signed)
Patient walked to the bathroom

## 2020-11-18 DIAGNOSIS — R0602 Shortness of breath: Secondary | ICD-10-CM | POA: Diagnosis not present

## 2020-11-18 MED ORDER — DIPHENHYDRAMINE HCL 25 MG PO CAPS
25.0000 mg | ORAL_CAPSULE | Freq: Four times a day (QID) | ORAL | Status: DC
Start: 2020-11-18 — End: 2020-11-18
  Administered 2020-11-18 (×2): 25 mg via ORAL
  Filled 2020-11-18 (×2): qty 1

## 2020-11-18 NOTE — Discharge Summary (Signed)
  Discharge Summary  Patient ID: Dawn Amsbaugh [id number] 74 y.o. 07-29-1945  Admit date: 11/17/2020  Discharge date and time: 11/18/2020  Admitting Physician: Elam Dutch, MD   Discharge Physician: Jamelle Haring, MD  Admission Diagnoses: Dyspnea [R06.00]  Discharge Diagnoses: Dermatitis  Admission Condition: good  Discharged Condition: good  Indication for Admission: incisional swelling and itching  Hospital Course: The patient was admitted for overnight observation.  Her vital signs remained stable and she was afebrile.  She remained neurologically intact.  Her complete blood count was within normal limits.  Her electrolytes and serum creatinine were also within normal limits.  She was eating and drinking without difficulty.  She was ambulating independently.  Consults: None  Treatments: IV hydration  Discharge Exam:  Vitals:   11/18/20 1100 11/18/20 1300  BP: 123/67 (!) 142/68  Pulse: 91 89  Resp: (!) 25 18  Temp:    SpO2: 96% 96%   Cardiac: Rate and rhythm regular Lungs: Clear to auscultation bilaterally Incisions: Well approximated.  Multiple areas of macular erythematous nodules and mild ecchymosis.  No focal hematoma. Extremities: 5 out of 5 upper and lower extremity motor strength. Abdomen: Benign Neurologic: Alert and oriented x4, face symmetric.  Speech clear.   Disposition: Discharge disposition: 01-Home or Self Care       Patient Instructions:  Allergies as of 11/18/2020   No Known Allergies      Medication List     TAKE these medications    acetaminophen 325 MG tablet Commonly known as: TYLENOL Take 650 mg by mouth every 6 (six) hours as needed for mild pain or headache.   amLODipine 10 MG tablet Commonly known as: NORVASC Take 10 mg by mouth daily.   aspirin 81 MG EC tablet Take 1 tablet (81 mg total) by mouth daily. Swallow whole.   clopidogrel 75 MG tablet Commonly known as: PLAVIX Take 1 tablet (75 mg total) by  mouth daily.   meclizine 12.5 MG tablet Commonly known as: ANTIVERT Take 12.5 mg by mouth 2 (two) times daily as needed for dizziness.   rosuvastatin 20 MG tablet Commonly known as: CRESTOR Take 1 tablet (20 mg total) by mouth daily.   VITAMIN D3 PO Take 1 capsule by mouth daily with breakfast.       ASK your doctor about these medications    amitriptyline 50 MG tablet Commonly known as: ELAVIL TAKE 1 TABLET(50 MG) BY MOUTH AT BEDTIME   pantoprazole 40 MG tablet Commonly known as: PROTONIX TAKE 1 TABLET(40 MG) BY MOUTH DAILY       Activity: no lifting, driving, or strenuous exercise for 2 weeks Diet: cardiac diet Wound Care: keep wound clean and dry.  May use anti-itch cream, Benadryl cream as needed  Follow-up with Dr. Stanford Breed in 2 weeks.  Signed: Barbie Banner, PA-C 11/18/2020 4:17 PM [facility] Office: [phone]

## 2020-11-18 NOTE — Progress Notes (Signed)
[facility] [place] PROGRESS NOTE  ASSESSMENT / PLAN: [name] is a 75 y.o. female s/p R TCAR. Contact dermatitis about R neck - likely from [facility] surgical dressing. Continue supportive care with benadryl. Safe for discharge this afternoon as long as family and patient are comfortable.   SUBJECTIVE: No interval complaints. Thinks she feels a bit better. History limited by language barrier.  OBJECTIVE: BP (!) 110/92   Pulse 100   Temp 98.7 F (37.1 C) (Oral)   Resp (!) 22   Ht '4\' 11"'$  (1.499 m)   Wt 45.5 kg   SpO2 94%   BMI 20.26 kg/m   NAD RRR R neck clean, dry, and soft. Skin with mild erythema in distribution of surgical prep    CBC Latest Ref Rng & Units 11/17/2020 11/13/2020 11/12/2020  WBC 4.0 - 10.5 K/uL 8.0 9.1 7.6  Hemoglobin 12.0 - 15.0 g/dL 12.7 12.2 13.9  Hematocrit 36.0 - 46.0 % 38.0 36.0 40.8  Platelets 150 - 400 K/uL 218 173 162     CMP Latest Ref Rng & Units 11/17/2020 11/13/2020 11/12/2020  Glucose 70 - 99 mg/dL 137(H) 155(H) 135(H)  BUN 8 - 23 mg/dL 11 17 24(H)  Creatinine 0.44 - 1.00 mg/dL 0.68 0.71 0.80  Sodium 135 - 145 mmol/L 140 136 137  Potassium 3.5 - 5.1 mmol/L 4.2 3.8 3.9  Chloride 98 - 111 mmol/L 104 103 104  CO2 22 - 32 mmol/L '27 25 29  '$ Calcium 8.9 - 10.3 mg/dL 9.4 8.6(L) 9.2  Total Protein 6.5 - 8.1 g/dL - 5.7(L) 6.1(L)  Total Bilirubin 0.3 - 1.2 mg/dL - 0.7 1.0  Alkaline Phos 38 - 126 U/L - 161(H) 153(H)  AST 15 - 41 U/L - 121(H) 172(H)  ALT 0 - 44 U/L - 165(H) 175(H)    Estimated Creatinine Clearance: 42.7 mL/min (by C-G formula based on SCr of 0.68 mg/dL).  [name]. Dawn Breed, MD [facility] Phone Number: [phone] 11/18/2020 8:26 AM

## 2020-11-18 NOTE — ED Provider Notes (Signed)
[facility] EMERGENCY DEPARTMENT Provider Note   CSN: [id number] Arrival date & time: 11/17/20  0116     History Chief Complaint  Patient presents with   Shortness of Breath    [name] is a 75 y.o. female.  Patient presents to the emergency department for evaluation of shortness of breath.  Patient had carotid endarterectomy surgery 5 days ago.  She has developed generalized weakness and shortness of breath.  No strokelike symptoms.  No significant pain.  No fever.      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, 2007   S/P Wedge resection   GERD (gastroesophageal reflux disease)    Hypertension    Internal hemorrhoids    Vertigo     Patient Active Problem List   Diagnosis Date Noted   Dyspnea 11/17/2020   Protein-calorie malnutrition, severe 11/09/2020   Carotid stenosis, right 11/06/2020   Hyperlipidemia 11/06/2020   CVA (cerebral vascular accident) ([place]) 11/05/2020   Rectal bleeding 06/30/2018   Palpitations 10/19/2016   Lightheadedness 10/19/2016   GERD (gastroesophageal reflux disease) 10/12/2012   Dilated extrahepatic bile ducts 01/21/2012   VERTIGO 06/04/2008   HEADACHE 06/04/2008   Insomnia 04/01/2008   EPIGASTRIC PAIN, CHRONIC 03/22/2008   PERSONAL HISTORY GIST,  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/2007    Past Surgical History:  Procedure Laterality Date   BREAST BIOPSY Right 2009   CHOLECYSTECTOMY     COLONOSCOPY     ERCP  01/21/2012   Procedure: ENDOSCOPIC RETROGRADE CHOLANGIOPANCREATOGRAPHY (ERCP);  Surgeon: Gatha Mayer, MD;  Location: [facility];  Service: Endoscopy;  Laterality: N/A;  Needs interpreter- vietnanese   ESOPHAGOGASTRODUODENOSCOPY     gist resection     TRANSCAROTID ARTERY REVASCULARIZATION  Right 11/12/2020   Procedure: RIGHT TRANSCAROTID ARTERY REVASCULARIZATION;   Surgeon: Cherre Robins, MD;  Location: [facility] OR;  Service: Vascular;  Laterality: Right;     OB History   No obstetric history on file.     Family History  Problem Relation Age of Onset   Colon cancer Neg Hx     Social History   Tobacco Use   Smoking status: Former    Packs/day: 0.50    Years: 40.00    Pack years: 20.00    Types: Cigarettes    Quit date: 04/07/2003    Years since quitting: 17.6   Smokeless tobacco: Never  Vaping Use   Vaping Use: Never used  Substance Use Topics   Alcohol use: No   Drug use: No    Home Medications Prior to Admission medications   Medication Sig Start Date End Date Taking? Authorizing Provider  acetaminophen (TYLENOL) 325 MG tablet Take 650 mg by mouth every 6 (six) hours as needed for mild pain or headache.   Yes [provider]  amitriptyline (ELAVIL) 50 MG tablet TAKE 1 TABLET(50 MG) BY MOUTH AT BEDTIME Patient taking differently: Take 50 mg by mouth at bedtime. 08/11/20  Yes Gatha Mayer, MD  amLODipine (NORVASC) 10 MG tablet Take 10 mg by mouth daily.     Yes [provider]  aspirin EC 81 MG EC tablet Take 1 tablet (81 mg total) by mouth daily. Swallow whole. 11/14/20 11/09/21 Yes Pahwani, Einar Grad, MD  Cholecalciferol (VITAMIN D3 PO) Take 1 capsule by mouth daily with breakfast.   Yes  [provider]  clopidogrel (PLAVIX) 75 MG tablet Take 1 tablet (75 mg total) by mouth daily. 11/14/20 12/14/20 Yes Darliss Cheney, MD  meclizine (ANTIVERT) 12.5 MG tablet Take 12.5 mg by mouth 2 (two) times daily as needed for dizziness. 10/27/20  Yes [provider]  pantoprazole (PROTONIX) 40 MG tablet TAKE 1 TABLET(40 MG) BY MOUTH DAILY Patient taking differently: Take 40 mg by mouth daily. 08/11/20  Yes Gatha Mayer, MD  rosuvastatin (CRESTOR) 20 MG tablet Take 1 tablet (20 mg total) by mouth daily. 11/14/20 12/14/20 Yes Darliss Cheney, MD    Allergies    Patient has no known allergies.  Review of Systems    Review of Systems  Constitutional:  Positive for fatigue.  Respiratory:  Positive for shortness of breath.   All other systems reviewed and are negative.  Physical Exam Updated Vital Signs BP 117/79   Pulse 90   Temp 98.7 F (37.1 C) (Oral)   Resp (!) 26   Ht '4\' 11"'$  (1.499 m)   Wt 45.5 kg   SpO2 95%   BMI 20.26 kg/m   Physical Exam Vitals and nursing note reviewed.  Constitutional:      General: She is not in acute distress.    Appearance: Normal appearance. She is well-developed.  HENT:     Head: Normocephalic and atraumatic.     Right Ear: Hearing normal.     Left Ear: Hearing normal.     Nose: Nose normal.  Eyes:     Conjunctiva/sclera: Conjunctivae normal.     Pupils: Pupils are equal, round, and reactive to light.  Cardiovascular:     Rate and Rhythm: Regular rhythm.     Heart sounds: S1 normal and S2 normal. No murmur heard.   No friction rub. No gallop.  Pulmonary:     Effort: Pulmonary effort is normal. Tachypnea present. No respiratory distress.     Breath sounds: Decreased breath sounds and wheezing present.  Chest:     Chest wall: No tenderness.  Abdominal:     General: Bowel sounds are normal.     Palpations: Abdomen is soft.     Tenderness: There is no abdominal tenderness. There is no guarding or rebound. Negative signs include Murphy's sign and McBurney's sign.     Hernia: No hernia is present.  Musculoskeletal:        General: Normal range of motion.     Cervical back: Normal range of motion and neck supple.  Skin:    General: Skin is warm and dry.     Findings: No rash.  Neurological:     Mental Status: She is alert and oriented to person, place, and time.     GCS: GCS eye subscore is 4. GCS verbal subscore is 5. GCS motor subscore is 6.     Cranial Nerves: No cranial nerve deficit.     Sensory: No sensory deficit.     Coordination: Coordination normal.  Psychiatric:        Speech: Speech normal.        Behavior: Behavior normal.         Thought Content: Thought content normal.    ED Results / Procedures / Treatments   Labs (all labs ordered are listed, but only abnormal results are displayed) Labs Reviewed  BASIC METABOLIC PANEL - Abnormal; Notable for the following components:      Result Value   Glucose, Bld 137 (*)    All other components within normal limits  RESP PANEL BY RT-PCR (FLU A&B, COVID) ARPGX2  CBC WITH DIFFERENTIAL/PLATELET  BRAIN NATRIURETIC PEPTIDE  TROPONIN I (HIGH SENSITIVITY)  TROPONIN I (HIGH SENSITIVITY)    EKG None  Radiology DG Chest 2 View  Result Date: 11/17/2020 CLINICAL DATA:  Shortness of breath. EXAM: CHEST - 2 VIEW COMPARISON:  Radiograph 11/05/2020 FINDINGS: The cardiomediastinal contours are normal. Aortic atherosclerosis. Pulmonary vasculature is normal. No consolidation, pleural effusion, or pneumothorax. No acute osseous abnormalities are seen. IMPRESSION: No acute chest findings. Electronically Signed   By: Keith Rake M.D.   On: 11/17/2020 01:58   CT Soft Tissue Neck W Contrast  Result Date: 11/17/2020 CLINICAL DATA:  Neck mass workup. Carotid stent placement on Wednesday, now with shortness of breath. EXAM: CT NECK WITH CONTRAST TECHNIQUE: Multidetector CT imaging of the neck was performed using the standard protocol following the bolus administration of intravenous contrast. CONTRAST:  158m OMNIPAQUE IOHEXOL 350 MG/ML SOLN COMPARISON:  11/05/2020 neck CTA FINDINGS: Pharynx and larynx: No evidence of mass or inflammation. Salivary glands: No inflammation, mass, or stone. Thyroid: Coarse calcification in the posterior right lobe measuring 1 cm. Lymph nodes: None enlarged or abnormal density. Vascular: Recent right carotid revascularization. Diffusely patent right common carotid to ICA stent. Presumably incisional low-density collection anterior to the common carotid and in the lower sternocleidomastoid, measuring up to 2 cm. When compared to preoperative imaging there is  increased soft tissue density cuffing at the proximal subclavian and common carotid. No associated flow limiting stenosis. Limited intracranial: Negative for acute process Visualized orbits: Bilateral cataract resection Mastoids and visualized paranasal sinuses: Clear Skeleton: Generalized osteopenia. Upper chest: Emphysema IMPRESSION: 2 cm postoperative fluid collection anterior to the right common carotid and in the sternocleidomastoidcuffing/wall thickening of the adjacent proximal right subclavian and common carotid arteries which is presumably postprocedural. No associated stenosis or dissection flap. Electronically Signed   By: JMonte FantasiaM.D.   On: 11/17/2020 07:19   CT Angio Chest Pulmonary Embolism (PE) W or WO Contrast  Result Date: 11/17/2020 CLINICAL DATA:  75year old female with history of carotid stent placed on Wednesday. Increasing shortness of breath. Evaluate for pulmonary embolism. EXAM: CT ANGIOGRAPHY CHEST WITH CONTRAST TECHNIQUE: Multidetector CT imaging of the chest was performed using the standard protocol during bolus administration of intravenous contrast. Multiplanar CT image reconstructions and MIPs were obtained to evaluate the vascular anatomy. CONTRAST:  1060mOMNIPAQUE IOHEXOL 350 MG/ML SOLN COMPARISON:  No priors. FINDINGS: Cardiovascular: There are no filling defects within the pulmonary arterial tree to suggest pulmonary embolism. Heart size is normal. There is no significant pericardial fluid, thickening or pericardial calcification. There is aortic atherosclerosis, as well as atherosclerosis of the great vessels of the mediastinum and the coronary arteries, including calcified atherosclerotic plaque in the left main, left anterior descending, left circumflex and right coronary arteries. Mediastinum/Nodes: No pathologically enlarged mediastinal or hilar lymph nodes. Esophagus is unremarkable in appearance. No axillary lymphadenopathy. Lungs/Pleura: Diffuse bronchial wall  thickening with moderate centrilobular and paraseptal emphysema. Linear area of scarring or subsegmental atelectasis in the inferior segment of the lingula. No acute consolidative airspace disease. No pleural effusions. No definite suspicious appearing pulmonary nodules or masses are noted. Upper Abdomen: Nodular contour of the visualized liver suggestive of underlying cirrhosis. Low-attenuation lesions in segment 8 of the liver, incompletely characterized on today's arterial phase examination, but similar to prior studies, statistically likely to represent cysts. Status post cholecystectomy. Aortic atherosclerosis. Musculoskeletal: There are no aggressive appearing lytic or blastic lesions noted in the  visualized portions of the skeleton. Review of the MIP images confirms the above findings. IMPRESSION: 1. No evidence of pulmonary embolism. 2. No acute findings in the thorax to account for the patient's symptoms. 3. Aortic atherosclerosis, in addition to left main and 3 vessel coronary artery disease. Assessment for potential risk factor modification, dietary therapy or pharmacologic therapy may be warranted, if clinically indicated. 4. Cirrhosis. Aortic Atherosclerosis (ICD10-I70.0). Electronically Signed   By: Vinnie Langton M.D.   On: 11/17/2020 07:19    Procedures Procedures   Medications Ordered in ED Medications  potassium chloride SA (KLOR-CON) CR tablet 20-40 mEq (20 mEq Oral Not Given 11/17/20 1011)  ondansetron (ZOFRAN) injection 4 mg (has no administration in time range)  alum & mag hydroxide-simeth (MAALOX/MYLANTA) 200-200-20 MG/5ML suspension 15-30 mL (has no administration in time range)  pantoprazole (PROTONIX) EC tablet 40 mg (40 mg Oral Given 11/17/20 1017)  labetalol (NORMODYNE) injection 10 mg (has no administration in time range)  hydrALAZINE (APRESOLINE) injection 5 mg (has no administration in time range)  metoprolol tartrate (LOPRESSOR) injection 2-5 mg (has no administration in  time range)  guaiFENesin-dextromethorphan (ROBITUSSIN DM) 100-10 MG/5ML syrup 15 mL (has no administration in time range)  phenol (CHLORASEPTIC) mouth spray 1 spray (has no administration in time range)  sodium chloride flush (NS) 0.9 % injection 3 mL (3 mLs Intravenous Not Given 11/17/20 2102)  sodium chloride flush (NS) 0.9 % injection 3 mL (has no administration in time range)  0.9 %  sodium chloride infusion (has no administration in time range)  acetaminophen (TYLENOL) tablet 325-650 mg (has no administration in time range)    Or  acetaminophen (TYLENOL) suppository 325-650 mg (has no administration in time range)  oxyCODONE (Oxy IR/ROXICODONE) immediate release tablet 5-10 mg (has no administration in time range)  morphine 2 MG/ML injection 2-5 mg (has no administration in time range)  docusate sodium (COLACE) capsule 100 mg (100 mg Oral Not Given 11/18/20 0440)  diphenhydrAMINE (BENADRYL) capsule 25 mg (25 mg Oral Not Given 11/17/20 1016)  diphenhydrAMINE (BENADRYL) capsule 25 mg (has no administration in time range)  amitriptyline (ELAVIL) tablet 50 mg (has no administration in time range)  amLODipine (NORVASC) tablet 10 mg (10 mg Oral Given 11/17/20 1103)  aspirin EC tablet 81 mg (81 mg Oral Given 11/17/20 1103)  clopidogrel (PLAVIX) tablet 75 mg (75 mg Oral Given 11/17/20 1102)  meclizine (ANTIVERT) tablet 12.5 mg (has no administration in time range)  rosuvastatin (CRESTOR) tablet 20 mg (20 mg Oral Given 11/17/20 1103)  iohexol (OMNIPAQUE) 350 MG/ML injection 100 mL (100 mLs Intravenous Contrast Given 11/17/20 [id number])    ED Course  I have reviewed the triage vital signs and the nursing notes.  Pertinent labs & imaging results that were available during my care of the patient were reviewed by me and considered in my medical decision making (see chart for details).    MDM Rules/Calculators/A&P                           Patient presents with shortness of breath.  Patient recently had carotid  endarterectomy surgery.  Patient noted to have tachypnea and some wheezing present upon arrival.  Work-up initiated, signed out to oncoming ER physician.  Final Clinical Impression(s) / ED Diagnoses Final diagnoses:  Shortness of breath    Rx / DC Orders ED Discharge Orders     None        Laure Leone, Gwenyth Allegra, MD  11/18/20 0643  

## 2020-11-18 NOTE — ED Notes (Signed)
Pt ambulated to bathroom on her own power, gait even and steady

## 2020-11-19 ENCOUNTER — Other Ambulatory Visit: Payer: Self-pay

## 2020-11-19 DIAGNOSIS — I6521 Occlusion and stenosis of right carotid artery: Secondary | ICD-10-CM

## 2020-12-09 ENCOUNTER — Ambulatory Visit (INDEPENDENT_AMBULATORY_CARE_PROVIDER_SITE_OTHER): Payer: Medicare Other | Admitting: Physician Assistant

## 2020-12-09 ENCOUNTER — Other Ambulatory Visit: Payer: Self-pay

## 2020-12-09 ENCOUNTER — Ambulatory Visit (HOSPITAL_COMMUNITY)
Admission: RE | Admit: 2020-12-09 | Discharge: 2020-12-09 | Disposition: A | Discharge status: Home / Self Care | Payer: Medicare Other | Source: Ambulatory Visit | Attending: Vascular Surgery | Admitting: Vascular Surgery

## 2020-12-09 VITALS — BP 126/82 | HR 96 | Temp 97.3°F | Resp 20 | Ht 59.0 in | Wt 92.2 lb

## 2020-12-09 DIAGNOSIS — I6521 Occlusion and stenosis of right carotid artery: Secondary | ICD-10-CM

## 2020-12-09 MED ORDER — CLOPIDOGREL BISULFATE 75 MG PO TABS: 75.0000 mg | ORAL_TABLET | Freq: Every day | ORAL | 6 refills | Status: DC

## 2020-12-09 MED ORDER — ROSUVASTATIN CALCIUM 20 MG PO TABS: 20.0000 mg | ORAL_TABLET | Freq: Every day | ORAL | 11 refills | Status: DC

## 2020-12-09 MED ORDER — ASPIRIN EC 81 MG PO TBEC: 81.0000 mg | DELAYED_RELEASE_TABLET | Freq: Every day | ORAL | 11 refills | Status: DC

## 2020-12-09 NOTE — Progress Notes (Signed)
POST OPERATIVE OFFICE NOTE    CC:  F/u for surgery  HPI:  This is a 75 y.o. female who is s/p right transcarotid artery revascularization by Dr. Stanford Breed on 11/12/20. This was performed due to symptomatic high grade right ICA stenosis with right hemispheric stroke. She did well post operatively and was discharge POD#1. She did return to the ED on 11/17/20 due to progressive dyspnea, generalized weakness and neck/facial swelling. She was admitted overnight for observation. CT imaging showed small peri surgical site fluid collection but no other concerning findings. She otherwise remained stable, afebrile with good vitals. She was tolerating diet and ambulating without difficulty so she was discharged the following day.  Today presents today with her niece and video interpreter was used to assist with today's visit. Patient reports of left upper and lower extremity weakness and numbness. She is still having generalized fatigue/ weakness. She is not having any surgical site pain. She denies any new visual changes, amaurosis fugax, slurred speech, trouble swallowing, facial drooping, new weakness or numbness of her upper or lower extremities. She has been taking her Aspirin, statin and Plavix  No Known Allergies  Current Outpatient Medications  Medication Sig Dispense Refill   acetaminophen (TYLENOL) 325 MG tablet Take 650 mg by mouth every 6 (six) hours as needed for mild pain or headache.     amitriptyline (ELAVIL) 50 MG tablet TAKE 1 TABLET(50 MG) BY MOUTH AT BEDTIME (Patient taking differently: Take 50 mg by mouth at bedtime.) 90 tablet 3   amLODipine (NORVASC) 10 MG tablet Take 10 mg by mouth daily.       aspirin EC 81 MG EC tablet Take 1 tablet (81 mg total) by mouth daily. Swallow whole. 30 tablet 11   aspirin EC 81 MG tablet Take 1 tablet (81 mg total) by mouth daily. Swallow whole. 30 tablet 11   Cholecalciferol (VITAMIN D3 PO) Take 1 capsule by mouth daily with breakfast.     clopidogrel  (PLAVIX) 75 MG tablet Take 1 tablet (75 mg total) by mouth daily. 30 tablet 0   clopidogrel (PLAVIX) 75 MG tablet Take 1 tablet (75 mg total) by mouth daily. 30 tablet 6   meclizine (ANTIVERT) 12.5 MG tablet Take 12.5 mg by mouth 2 (two) times daily as needed for dizziness.     pantoprazole (PROTONIX) 40 MG tablet TAKE 1 TABLET(40 MG) BY MOUTH DAILY (Patient taking differently: Take 40 mg by mouth daily.) 90 tablet 3   rosuvastatin (CRESTOR) 20 MG tablet Take 1 tablet (20 mg total) by mouth daily. 30 tablet 11   No current facility-administered medications for this visit.     ROS:  See HPI  Physical Exam:  Vitals:   12/09/20 1045 12/09/20 1050  BP: 124/76 126/82  Pulse: 96   Resp: 20   Temp: (!) 97.3 F (36.3 C)   TempSrc: Temporal   SpO2: 95%   Weight: 92 lb 3.2 oz (41.8 kg)   Height: '4\' 11"'$  (1.499 m)    General: well developed and well nourished, in no distress Cardiac: regular rate and rhythm, no carotid bruits Lungs: non labored Incision:  right neck incision healing very nicely. Steri strip removed. No swelling or hematoma. Left femoral vein access site dressing removed. No swelling or hematoma Extremities:  well perfused and warm. 2+ radial pulses bilaterally, 2+ femoral pulses and 2+ dp pulses bilaterally. Motor and sensation intact Neuro: CN intact. Alert and oriented. Speech coherent.   Non invasive vascular lab: 12/09/20 Summary:  Right Carotid: There is no evidence of stenosis in the right ICA. Non-hemodynamically significant plaque <50% noted in the CCA. The ECA appears >50% stenosed. Patent stent with no evidence of restenosis.   Left Carotid: Velocities in the left ICA are consistent with a 1-39% stenosis. Non-hemodynamically significant plaque <50% noted in the CCA.   Vertebrals:  Bilateral vertebral arteries demonstrate antegrade flow.  Subclavians: Normal flow hemodynamics were seen in bilateral subclavian arteries.    Assessment/Plan:  This is a 75 y.o.  female who is s/p right transcarotid artery revascularization by Dr. Stanford Breed on 11/12/20. This was performed due to symptomatic high grade right ICA stenosis with right hemispheric stroke.She is doing well post operatively. She reports no new neurologically deficits. - Carotid duplex today shows patent bilateral ICA's. Right is without any in stent restenosis. Left ICA shows 1-39% stenosis. Normal flow dynamics within the vertebral and subclavian arteries - Keep follow up with Neurologist Dr. Erlinda Hong - Continue Aspirin, statin and Plavix. New prescriptions sent to patients Pharmacy - She will return for her 9 month post operative visit with carotid duplex   Karoline Caldwell, PA-C [facility] [phone]  Clinic MD:  Dr. Roxanne Mins

## 2020-12-10 ENCOUNTER — Other Ambulatory Visit: Payer: Self-pay

## 2020-12-10 DIAGNOSIS — I6521 Occlusion and stenosis of right carotid artery: Secondary | ICD-10-CM

## 2020-12-30 ENCOUNTER — Encounter: Payer: Self-pay | Admitting: Adult Health

## 2020-12-30 ENCOUNTER — Ambulatory Visit (INDEPENDENT_AMBULATORY_CARE_PROVIDER_SITE_OTHER): Payer: Medicare Other | Admitting: Adult Health

## 2020-12-30 VITALS — BP 126/74 | HR 88 | Ht 61.0 in | Wt 95.0 lb

## 2020-12-30 DIAGNOSIS — R7989 Other specified abnormal findings of blood chemistry: Secondary | ICD-10-CM

## 2020-12-30 DIAGNOSIS — E785 Hyperlipidemia, unspecified: Secondary | ICD-10-CM

## 2020-12-30 DIAGNOSIS — I69398 Other sequelae of cerebral infarction: Secondary | ICD-10-CM | POA: Diagnosis not present

## 2020-12-30 DIAGNOSIS — I63231 Cerebral infarction due to unspecified occlusion or stenosis of right carotid arteries: Secondary | ICD-10-CM

## 2020-12-30 DIAGNOSIS — I10 Essential (primary) hypertension: Secondary | ICD-10-CM

## 2020-12-30 DIAGNOSIS — R269 Unspecified abnormalities of gait and mobility: Secondary | ICD-10-CM

## 2020-12-30 NOTE — Progress Notes (Signed)
I agree with the above plan 

## 2020-12-30 NOTE — Progress Notes (Signed)
[facility] [address] [place]. [place] [address] [phone]       HOSPITAL FOLLOW UP NOTE  [name] Date of Birth:  1945-05-01 Medical Record Number:  [id number]   Reason for Referral:  hospital stroke follow up    SUBJECTIVE:   CHIEF COMPLAINT:  Chief Complaint  Patient presents with   Follow-up    RM 3 with daughter Phoung (& cone interpreter) Pt is well, has some fatigue, shaking legs, numbness in hands and slower gait than before.     HPI:   [name] is a 75 y.o. female with history of hypertension, GERD, and vertigo who presented to [facility] on 11/05/2020 for evaluation after an outpatient MRI brain, ordered by her PCP, identified multiple acute strokes. The MRI was ordered for evaluation of a one month hx of bitemporal headaches and left upper extremity weakness / numbness associated with right eye blurry vision in the inferior visual field.  She was transferred to [facility] ED for further evaluation.  Initially evaluated by Dr. Leonie Man for subacute right MCA infarcts secondary to symptomatic severe right ICA stenosis (80%) as evidenced on both CTA and carotid Dopplers. S/p TCAR [id number] without complication.  EF 60 to 65%.  LDL 139 -initiate atorvastatin 80 mg daily but switched to Crestor due to elevated LFTs.  A1c 6.0 - no DM hx.  Recommended DAPT for 1 month post TCAR and then aspirin indefinitely.  HTN on amlodipine and recommend BP goal 130-160 prior to carotid revascularization.  Other stroke risk factors include advanced age and former tobacco use (quit 17 years ago). Therapy evals recommended HH PT/OT/SLP.   Today, 12/30/2020, [name] is being seen for hospital follow-up accompanied by daughter and [facility] interpreter as primary language. She reports fatigue, L leg weakness and some weakness RLE (after TCAR procedure), numbness in both hands and gait unsteadiness - has been gradually improving but not yet at baseline.  She has  since completed home health therapies.  She ambulates currently without assistive device but daughter questions if cane is needed. When working with [facility] therapies, no cane or AD needed. Occasional quick zap/twitch sensation on left side of head which has been gradually improving maybe occurring once every 2-3 days. A nurse continues to come once every week. Daughter is interested in additional outpatient therapies.  Remains on both aspirin and Plavix as well as Crestor.  Blood pressure today 126/74.  No further concerns at this time.    PERTINENT IMAGING  MR BRAIN 11/05/2020 IMPRESSION: 1. Subacute right MCA infarcts. 2. Mild chronic small vessel ischemic disease.  CTA HEAD/NECK 11/05/2020 IMPRESSION: 1. No intracranial arterial occlusion or high-grade stenosis. 2. 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  CAROTID DUPLEX   11/06/2020 Summary:  Right Carotid: Velocities in the right ICA are consistent with a 80-99% stenosis.  Left Carotid: Velocities in the left ICA are consistent with a 1-39% stenosis. Vertebrals: Bilateral vertebral arteries demonstrate antegrade flow.   12/09/2020 (S/P TCAR) Summary:  Right Carotid: There is no evidence of stenosis in the right ICA. Non-hemodynamically significant plaque <50% noted in the CCA. The ECA appears >50% stenosed. Patent stent with no evidence of restenosis.  Left Carotid: Velocities in the left ICA are consistent with a 1-39% stenosis. Non-hemodynamically significant plaque <50% noted in the CCA.  Vertebrals:  Bilateral vertebral arteries demonstrate antegrade flow.  Subclavians: Normal flow hemodynamics were seen in bilateral subclavian  Arteries.    2D ECHO 11/08/2020 IMPRESSIONS   1. Left ventricular ejection fraction, by estimation, is 60 to 65%. The  left ventricle has normal function. The left ventricle has no regional  wall motion abnormalities. Left ventricular diastolic  parameters were  normal.   2. Right ventricular systolic function was not well visualized. The right  ventricular size is normal. There is normal pulmonary artery systolic  pressure. The estimated right ventricular systolic pressure is 99991111 mmHg.   3. The mitral valve is normal in structure. No evidence of mitral valve  regurgitation. No evidence of mitral stenosis.   4. The aortic valve is tricuspid. Aortic valve regurgitation is trivial.  Mild to moderate aortic valve sclerosis/calcification is present, without  any evidence of aortic stenosis.   5. The inferior vena cava is normal in size with greater than 50%  respiratory variability, suggesting right atrial pressure of 3 mmHg.      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   GERD (gastroesophageal reflux disease)    Hypertension    Internal hemorrhoids    Vertigo     PSH:  Past Surgical History:  Procedure Laterality Date   BREAST BIOPSY Right 2009   CHOLECYSTECTOMY     COLONOSCOPY     ERCP  01/21/2012   Procedure: ENDOSCOPIC RETROGRADE CHOLANGIOPANCREATOGRAPHY (ERCP);  Surgeon: Gatha Mayer, MD;  Location: [facility];  Service: Endoscopy;  Laterality: N/A;  Needs interpreter- vietnanese   ESOPHAGOGASTRODUODENOSCOPY     gist resection     TRANSCAROTID ARTERY REVASCULARIZATION  Right 11/12/2020   Procedure: RIGHT TRANSCAROTID ARTERY REVASCULARIZATION;  Surgeon: Cherre Robins, MD;  Location: [facility] OR;  Service: Vascular;  Laterality: Right;    Social History:  Social History   Socioeconomic History   Marital status: Widowed    Spouse name: Not on file   Number of children: 1   Years of education: Not on file   Highest education level:  Not on file  Occupational History   Occupation: UNEMPLOYED    Employer: UNEMPLOYED  Tobacco Use   Smoking status: Former    Packs/day: 0.50    Years: 40.00    Pack years: 20.00    Types: Cigarettes    Quit date: 04/07/2003    Years since quitting: 17.7   Smokeless tobacco: Never  Vaping Use   Vaping Use: Never used  Substance and Sexual Activity   Alcohol use: No   Drug use: No   Sexual activity: Not on file  Other Topics Concern   Not on file  Social History Narrative   Widowed   1 child   Former smoker   No EtOH/drugs      Social Determinants of Radio broadcast assistant Strain: Not on file  Food Insecurity: Not on file  Transportation Needs: Not on file  Physical Activity: Not on file  Stress: Not on file  Social Connections: Not on file  Intimate Partner Violence: Not on file    Family History:  Family History  Problem Relation Age of Onset   Colon cancer Neg Hx     Medications:   Current Outpatient Medications on File Prior  to Visit  Medication Sig Dispense Refill   acetaminophen (TYLENOL) 325 MG tablet Take 650 mg by mouth every 6 (six) hours as needed for mild pain or headache.     amitriptyline (ELAVIL) 50 MG tablet TAKE 1 TABLET(50 MG) BY MOUTH AT BEDTIME (Patient taking differently: Take 50 mg by mouth at bedtime.) 90 tablet 3   amLODipine (NORVASC) 10 MG tablet Take 10 mg by mouth daily.       aspirin EC 81 MG EC tablet Take 1 tablet (81 mg total) by mouth daily. Swallow whole. 30 tablet 11   Cholecalciferol (VITAMIN D3 PO) Take 1 capsule by mouth daily with breakfast.     meclizine (ANTIVERT) 12.5 MG tablet Take 12.5 mg by mouth 2 (two) times daily as needed for dizziness.     pantoprazole (PROTONIX) 40 MG tablet TAKE 1 TABLET(40 MG) BY MOUTH DAILY (Patient taking differently: Take 40 mg by mouth daily.) 90 tablet 3   rosuvastatin (CRESTOR) 20 MG tablet Take 1 tablet (20 mg total) by mouth daily. 30 tablet 11   No current facility-administered  medications on file prior to visit.    Allergies:  No Known Allergies    OBJECTIVE:  Physical Exam  Vitals:   12/30/20 0817  BP: 126/74  Pulse: 88  Weight: 95 lb (43.1 kg)  Height: '5\' 1"'$  (1.549 m)   Body mass index is 17.95 kg/m. No results found.  Post stroke PHQ 2/9 Depression screen PHQ 2/9 12/30/2020  Decreased Interest 0  Down, Depressed, Hopeless 0  PHQ - 2 Score 0     General: Frail very pleasant elderly Asian female, seated, in no evident distress Head: head normocephalic and atraumatic.   Neck: supple with no carotid or supraclavicular bruits Cardiovascular: regular rate and rhythm, no murmurs Musculoskeletal: no deformity Skin:  no rash/petichiae Vascular:  Normal pulses all extremities   Neurologic Exam Mental Status: Awake and fully alert.  [place] speaking only.  Denies speech or language difficulties.  Oriented to place and time. Recent and remote memory intact. Attention span, concentration and fund of knowledge appropriate. Mood and affect appropriate.  Cranial Nerves: Fundoscopic exam reveals sharp disc margins. Pupils equal, briskly reactive to light. Extraocular movements full without nystagmus. Visual fields full to confrontation. Hearing intact. Facial sensation intact. Face, tongue, palate moves normally and symmetrically.  Motor: Normal bulk and tone. Normal strength in all tested extremity muscles except mild left hip flexor weakness Sensory.: intact to touch , pinprick , position and vibratory sensation.  Subjective bilateral hand numbness Coordination: Rapid alternating movements normal in all extremities. Finger-to-nose and heel-to-shin performed accurately bilaterally. Mild RUE intention tremor (chronic per pt) Gait and Station: Arises from chair without difficulty. Stance is normal. Gait demonstrates decreased stride length and step height with mild unsteadiness and slow cautious steps holding onto wall.  No assistive device use.  Tandem walk  and heel toe not attempted.  Reflexes: 1+ and symmetric. Toes downgoing.     NIHSS  0 Modified Rankin  2-3      ASSESSMENT: [name] is a 75 y.o. year old female with recent subacute right MCA infarcts secondary to symptomatic severe right ICA stenosis on 11/05/2020 s/p TCAR 11/12/2020. Vascular risk factors include HTN, HLD, right ICA stenosis and advanced age.      PLAN:  Right MCA strokes:  Residual deficit: Mild LLE hip flexor weakness and gait unsteadiness.  Referral placed to neuro rehab PT. ?benefit from use of cane - defer to  PT Continue aspirin 81 mg daily  and Crestor 40 mg daily for secondary stroke prevention.  Advised to discontinue Plavix x1 month DAPT completed (per VVS recommendations)  discussed secondary stroke prevention measures and importance of close PCP follow up for aggressive stroke risk factor management. I have gone over the pathophysiology of stroke, warning signs and symptoms, risk factors and their management in some detail with instructions to go to the closest emergency room for symptoms of concern. Right ICA stenosis:s/p TCAR.  Carotid duplex 8/23 showed patent bilateral ICAs without any stent restenosis.  Plan to repeat carotid duplex 9 months postoperative visit (approx 08/2021).  Completed 1 months DAPT.  Continuation of aspirin and statin HTN: BP goal <130/90.  Stable on amlodipine  per PCP HLD: LDL goal <70. Recent LDL 139.  Initially placed on atorvastatin 80 mg daily but due to elevated LFTs, she was transitioned to Crestor 40 mg daily. Repeat lipid panel today as well as LFTs     Follow up in 4 months or call earlier if needed   CC:  [place] provider: Dr. Leonie Man PCP: Lavone Orn, MD    I spent 57 minutes of face-to-face and non-face-to-face time with patient and daughter assisted by interpreter.  This included previsit chart review including review of recent hospitalization, lab review, study review, order entry, electronic health  record documentation, patient and daughter education regarding recent stroke including etiology, secondary stroke prevention measures and importance of managing stroke risk factors, residual deficits and typical recovery time and answered all other questions to patient and daughters satisfaction   Frann Rider, AGNP-BC  [facility] [address] [place], [place] [address]  Phone [phone] Fax [phone] Note: This document was prepared with digital dictation and possible smart phrase technology. Any transcriptional errors that result from this process are unintentional.

## 2020-12-30 NOTE — Patient Instructions (Addendum)
Referral will be placed neuro rehab physical therapy - please call on Thursday to schedule  Continue aspirin 81 mg daily  and Crestor '20mg'$  daily  for secondary stroke prevention  Please stop plavix at this time   Continue to follow up with PCP regarding cholesterol and blood pressure management  Maintain strict control of hypertension with blood pressure goal below 130/90 and cholesterol with LDL cholesterol (bad cholesterol) goal below 70 mg/dL.   Continue to follow with vascular surgery for repeat carotid ultrasound around 08/2021      Followup in the future with me in 4 months or call earlier if needed      Thank you for coming to see [place] at [facility]. I hope we have been able to provide you high quality care today.  You may receive a patient satisfaction survey over the next few weeks. We would appreciate your feedback and comments so that we may continue to improve ourselves and the health of our patients.

## 2020-12-31 LAB — HEPATIC FUNCTION PANEL
ALT: 34 IU/L — ABNORMAL HIGH (ref 0–32)
AST: 36 IU/L (ref 0–40)
Albumin: 4.5 g/dL (ref 3.7–4.7)
Alkaline Phosphatase: 96 IU/L (ref 44–121)
Bilirubin Total: 0.7 mg/dL (ref 0.0–1.2)
Bilirubin, Direct: 0.17 mg/dL (ref 0.00–0.40)
Total Protein: 7.3 g/dL (ref 6.0–8.5)

## 2020-12-31 LAB — LIPID PANEL
Chol/HDL Ratio: 2.4 ratio (ref 0.0–4.4)
Cholesterol, Total: 169 mg/dL (ref 100–199)
HDL: 69 mg/dL (ref >39)
LDL Chol Calc (NIH): 74 mg/dL (ref 0–99)
Triglycerides: 157 mg/dL — ABNORMAL HIGH (ref 0–149)
VLDL Cholesterol Cal: 26 mg/dL (ref 5–40)

## 2021-01-06 ENCOUNTER — Telehealth: Payer: Self-pay

## 2021-01-06 NOTE — Telephone Encounter (Signed)
-----   Message from Frann Rider, NP sent at 01/05/2021  8:09 AM EDT ----- Please advise patient/family that repeat cholesterol levels showed improvement of LDL or bad cholesterol at 74 with goal less than 70 as well as improvement of liver function.  Please continue Crestor 40 mg daily.

## 2021-01-06 NOTE — Telephone Encounter (Signed)
I called the pt's daughter ( ok per dpr/ she speaks english as well).  Relayed results of labs and she verbalized understanding. She will update the pt accordingly.

## 2021-02-18 ENCOUNTER — Other Ambulatory Visit: Payer: Self-pay

## 2021-02-18 ENCOUNTER — Ambulatory Visit: Payer: Medicare Other | Attending: Adult Health | Admitting: Physical Therapy

## 2021-02-18 ENCOUNTER — Encounter: Payer: Self-pay | Admitting: Physical Therapy

## 2021-02-18 ENCOUNTER — Telehealth: Payer: Self-pay | Admitting: Physical Therapy

## 2021-02-18 VITALS — BP 121/85 | HR 85

## 2021-02-18 DIAGNOSIS — I69354 Hemiplegia and hemiparesis following cerebral infarction affecting left non-dominant side: Secondary | ICD-10-CM | POA: Insufficient documentation

## 2021-02-18 DIAGNOSIS — M6281 Muscle weakness (generalized): Secondary | ICD-10-CM | POA: Insufficient documentation

## 2021-02-18 DIAGNOSIS — R42 Dizziness and giddiness: Secondary | ICD-10-CM | POA: Insufficient documentation

## 2021-02-18 DIAGNOSIS — R2681 Unsteadiness on feet: Secondary | ICD-10-CM | POA: Insufficient documentation

## 2021-02-18 DIAGNOSIS — R2689 Other abnormalities of gait and mobility: Secondary | ICD-10-CM | POA: Diagnosis present

## 2021-02-18 DIAGNOSIS — R293 Abnormal posture: Secondary | ICD-10-CM | POA: Diagnosis present

## 2021-02-18 NOTE — Telephone Encounter (Signed)
Hello, I completed the PT evaluation for [name] this morning.  She reports continued numbness and weakness in left upper extremity, and would benefit from OT evaluation.  If you agree, could you please place OT eval and treat order in Epic?  Thank you.  Mady Haagensen, PT 02/18/21 1:47 PM Phone: [phone] Fax: [phone]

## 2021-02-18 NOTE — Patient Instructions (Addendum)
Access Code: [email or web address] URL: [email or web address] Date: 02/18/2021 Prepared by: Mady Haagensen  Exercises Seated March - 1-2 x daily - 5 x weekly - 1 sets - 10 reps Seated Long Arc Quad - 1-2 x daily - 5 x weekly - 1 sets - 10 reps Seated Ankle Dorsiflexion AROM - 1-2 x daily - 5 x weekly - 1 sets - 10 reps   Instructed pt to sit supported with pillow behind back, 10-15 minutes, at least 3x/day to increase sitting tolerance and decrease time in the bed throughout the day.

## 2021-02-18 NOTE — Therapy (Signed)
[facility] [address], [place], [place], [address] Phone: [phone]   Fax:  [phone]  Physical Therapy Evaluation  Patient Details  Name: [name] MRN: [id number] Date of Birth: 1946-03-13 Referring Provider (PT): Frann Rider, NP   Encounter Date: 02/18/2021   PT End of Session - 02/18/21 0755     Visit Number 1    Number of Visits 13    Date for PT Re-Evaluation 04/17/21    Authorization Type Medicare    Progress Note Due on Visit 10    PT Start Time 2683    PT Stop Time 0849    PT Time Calculation (min) 52 min    Equipment Utilized During Treatment Gait belt    Activity Tolerance Patient limited by lethargy;Patient limited by fatigue    Behavior During Therapy [facility] for tasks assessed/performed             Past Medical History:  Diagnosis Date   Acute pancreatitis after ERCP  01/22/2012   Arthritis    EPIGASTRIC PAIN, CHRONIC 03/22/2008        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, 2007   S/P Wedge resection   GERD (gastroesophageal reflux disease)    Hypertension    Internal hemorrhoids    Vertigo     Past Surgical History:  Procedure Laterality Date   BREAST BIOPSY Right 2009   CHOLECYSTECTOMY     COLONOSCOPY     ERCP  01/21/2012   Procedure: ENDOSCOPIC RETROGRADE CHOLANGIOPANCREATOGRAPHY (ERCP);  Surgeon: Gatha Mayer, MD;  Location: [facility];  Service: Endoscopy;  Laterality: N/A;  Needs interpreter- vietnanese   ESOPHAGOGASTRODUODENOSCOPY     gist resection     TRANSCAROTID ARTERY REVASCULARIZATION  Right 11/12/2020   Procedure: RIGHT TRANSCAROTID ARTERY REVASCULARIZATION;  Surgeon: Cherre Robins, MD;  Location: [facility];  Service: Vascular;  Laterality: Right;    Vitals:   02/18/21 0838  BP: 121/85  Pulse: 85  SpO2: 96%      Subjective Assessment - 02/18/21 0802     Subjective Pt reports her doctor recommends therapy for her hands and legs after a stroke in  July.  She reports her hands shake and are numb.  Daughter reports L arm is weak.  R leg is weaker.  No falls; does have a walker; doesn't have a cane.    Patient is accompained by: Family member;Interpreter   daughter   Limitations Standing;Walking    How long can you stand comfortably? several minutes before she feels she may "collapse"    How long can you walk comfortably? 2 minutes or less    Patient Stated Goals Pt's goal for therapy is to have stability and strength in legs and hands.    Currently in Pain? No/denies   shakiness and numbness               [facility] PT Assessment - 02/18/21 0810       Assessment   Medical Diagnosis gait disturbance post CVA    Referring Provider (PT) Frann Rider, NP    Onset Date/Surgical Date 12/30/20   PT referral   Hand Dominance Right    Prior Therapy HHPT and OT      Precautions   Precautions Fall;Other (comment)    Precaution Comments Avoid lifting > 5 lbs      Balance Screen   Has the patient fallen in the past 6 months No    Has the  patient had a decrease in activity level because of a fear of falling?  Yes    Is the patient reluctant to leave their home because of a fear of falling?  Yes      [place] residence    Living Arrangements Children   grandchildren/family   Available Help at Discharge Family    Type of [place] Access Level entry    [place] - 2 wheels      Prior Function   Level of [place] Enjoyed cooking, cleaning, gardening      Observation/Other Assessments   Focus on Therapeutic Outcomes (FOTO)  NA      Sensation   Light Touch Impaired by gross assessment    Additional Comments Reports it feels numb in BLES, progressive distally      Coordination   Gross Motor Movements are Fluid and Coordinated No    Coordination and Movement Description slowed      Posture/Postural Control    Posture/Postural Control Postural limitations    Postural Limitations Rounded Shoulders;Flexed trunk;Posterior pelvic tilt    Posture Comments Reports fatigue in low back with sitting      ROM / Strength   AROM / PROM / Strength AROM;Strength      AROM   Overall AROM  Within functional limits for tasks performed    Overall AROM Comments BLES      Strength   Overall Strength Deficits    Strength Assessment Site Hip;Knee;Ankle    Right/Left Hip Right;Left    Right Hip Flexion 3+/5    Left Hip Flexion 3/5    Right/Left Knee Right;Left    Right Knee Flexion 3+/5    Right Knee Extension 3+/5    Left Knee Flexion 3+/5    Left Knee Extension 3/5    Right/Left Ankle Right;Left    Right Ankle Dorsiflexion 3+/5    Left Ankle Dorsiflexion 3/5      Transfers   Transfers Sit to Stand;Stand to Sit    Sit to Stand 5: Supervision;With upper extremity assist;From chair/3-in-1    Five time sit to stand comments  Unable to complete due to fatigue    Stand to Sit 5: Supervision;4: Min guard;With upper extremity assist;To chair/3-in-1      Ambulation/Gait   Ambulation/Gait Yes    Ambulation/Gait Assistance 4: Min guard;4: Min assist    Ambulation/Gait Assistance Details Pt reports feeling shaky, reaches for HHA of interpreter with gait into/out of clinic    Ambulation Distance (Feet) 60 Feet   x 2; 40 ft   Assistive device None;1 person hand held assist    Gait Pattern Step-to pattern;Step-through pattern;Decreased stride length;Decreased step length - left;Decreased step length - right;Wide base of support    Ambulation Surface Level;Indoor    Gait velocity 16.04 ft in approx 12 ft (pt asks to stop due to dizziness); gait velocity is 0.75 ft/sec    Gait Comments Pt requests to sit with gait due to dizziness.  BP measured and WNL (see vitals).  Pt reports dizziness as room spinning, head fullness and this happens any time she is up, resolves with sitting.      Standardized Balance Assessment    Standardized Balance Assessment Timed Up and Go Test      Timed Up and Go Test   TUG Normal TUG    Normal TUG (seconds) 31.56  TUG Comments Scores >13.5 sec indicate increased fall risk; >30 sec indicates difficulty with ADLs in the home                        Objective measurements completed on examination: See above findings.                PT Education - 02/18/21 0906     Education Details PT eval results, POC, initial HEP-see instructions    Person(s) Educated Patient;Child(ren);Other (comment)   via interpreter   Methods Explanation;Handout    Comprehension Verbalized understanding              PT Short Term Goals - 02/18/21 1256       PT SHORT TERM GOAL #1   Title Pt will perform HEP with family supervision for improved strength, balance, transfers, and gait.  TARGET 03/20/2021    Time 5    Period Weeks    Status New      PT SHORT TERM GOAL #2   Title Pt will improve 5x sit<>stand with minimal to no UE support to demonstrate improved functional strength and transfer efficiency.    Baseline performs x 1 only due to fatigue    Time 5    Period Weeks    Status New      PT SHORT TERM GOAL #3   Title Pt will improve TUG score to less than or equal to 25 sec for decreased fall risk.    Baseline 31.56 sec    Time 5    Period Weeks    Status New      PT SHORT TERM GOAL #4   Title Pt will ambulate at least 50 ft x 4 reps in gym area with cane and supervision, for improved independence and safety with household gait.    Time 5    Period Weeks    Status New      PT SHORT TERM GOAL #5   Title Further vestibular work-up and goals written as needed.               PT Long Term Goals - 02/18/21 1305       PT LONG TERM GOAL #1   Title Pt will perform HEP with family supervision for improved strength, balance, transfers, and gait.  TARGET 04/17/2021    Time 9    Period Weeks    Status New      PT LONG TERM GOAL #2   Title Pt  will improve gait velocity to at least 1.3 ft/sec for improved gait efficiency and safety.    Baseline 0.75 ft/sec    Time 9    Period Weeks    Status New      PT LONG TERM GOAL #3   Title Pt will improve TUG score to less than or equal to 18 sec for decreased fall risk.    Baseline 31.56 sec at eval    Time 9    Period Weeks    Status New      PT LONG TERM GOAL #4   Title Pt will ambulate at least 300 ft without rest breaks, using cane, supervision, for improved gait independence in home and short distance community.    Time 9    Period Weeks    Status New                    Plan - 02/18/21 6503  Clinical Impression Statement The pt is a 75 yo female who presents to OPPT wtih gait disturbance post CVA.  She presented 11/05/20 to hospital after outpatient MRI revealed multiple subacute strokes. The pt reported she has been experiencing dizziness and weakness in LUE and bilateral LE for ~1 month prior to [place]. Work up revealed R MCA CVA, right 89 - 99 % carotid artery stenosis with revascularization done 11/13/20.  She received HHPT and OT and now presents for OPPT evaluation.  She presents with decreased strength, decreased endurance for sitting, standing activities, abnormal posture, decreased balance, decreased independence with gait.  Prior to CVA, she was independent with all ADLs and enjoyed gardening and cooking.  She will benefit from skilled PT to address the above stated deficits to decrease fall risk and improve overall functional mobiltiy and return to independence.    Personal Factors and Comorbidities Comorbidity 3+    Comorbidities PMH includes: vertigo, HTN, and GERD    Examination-Activity Limitations Locomotion Level;Transfers;Sit;Stand;Dressing    Examination-Participation Restrictions Meal Prep;Cleaning;Other   gardening   Stability/Clinical Decision Making Evolving/Moderate complexity    Clinical Decision Making Moderate    Rehab Potential Good    PT  Frequency Other (comment)   1x/wk for 5 weeks, then 2x/wk for 4 weeks   PT Duration Other (comment)   including eval visit, 9 weeks total POC   PT Treatment/Interventions ADLs/Self Care Home Management;Neuromuscular re-education;Balance training;Therapeutic exercise;DME Instruction;Therapeutic activities;Functional mobility training;Gait training;Patient/family education;Vestibular    PT Next Visit Plan Further vestibular assessment as needed due to pt's c/o dizziness; review initial HEP and ask how it went for sitting more during the day (vs being in bed); try cane for gait; continue strengthening, balance, and HEP progression.  Monitor vitals    Recommended Other Services OT eval based on pt's c/o numbness, weakness LUE    Consulted and Agree with Plan of Care Patient;Family member/caregiver   and interpreter   Family Member Consulted Daughter             Patient will benefit from skilled therapeutic intervention in order to improve the following deficits and impairments:  Abnormal gait, Difficulty walking, Decreased endurance, Decreased activity tolerance, Decreased balance, Decreased mobility, Decreased strength, Postural dysfunction, Impaired sensation  Visit Diagnosis: Other abnormalities of gait and mobility  Muscle weakness (generalized)  Unsteadiness on feet  Abnormal posture  Dizziness and giddiness     Problem List Patient Active Problem List   Diagnosis Date Noted   Dyspnea 11/17/2020   Protein-calorie malnutrition, severe 11/09/2020   Carotid stenosis, right 11/06/2020   Hyperlipidemia 11/06/2020   CVA (cerebral vascular accident) ([place]) 11/05/2020   Rectal bleeding 06/30/2018   Palpitations 10/19/2016   Lightheadedness 10/19/2016   GERD (gastroesophageal reflux disease) 10/12/2012   Dilated extrahepatic bile ducts 01/21/2012   VERTIGO 06/04/2008   HEADACHE 06/04/2008   Insomnia 04/01/2008   EPIGASTRIC PAIN, CHRONIC 03/22/2008   PERSONAL HISTORY GIST,   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/2007    Kaien Pezzullo W., PT 02/18/2021, 1:41 PM  [facility] [address] [place], [place], [address] Phone: [phone]   Fax:  [phone]  Name: Kechia Yahnke MRN: [id number] Date of Birth: [birth year]

## 2021-02-23 NOTE — Telephone Encounter (Signed)
Order placed as requested. Please let me know if you need anything else!

## 2021-02-24 ENCOUNTER — Other Ambulatory Visit: Payer: Self-pay

## 2021-02-24 ENCOUNTER — Ambulatory Visit: Payer: Medicare Other | Admitting: Physical Therapy

## 2021-02-24 ENCOUNTER — Encounter: Payer: Self-pay | Admitting: Physical Therapy

## 2021-02-24 DIAGNOSIS — M6281 Muscle weakness (generalized): Secondary | ICD-10-CM

## 2021-02-24 DIAGNOSIS — R2689 Other abnormalities of gait and mobility: Secondary | ICD-10-CM

## 2021-02-24 DIAGNOSIS — R293 Abnormal posture: Secondary | ICD-10-CM

## 2021-02-24 DIAGNOSIS — R42 Dizziness and giddiness: Secondary | ICD-10-CM

## 2021-02-24 DIAGNOSIS — I69354 Hemiplegia and hemiparesis following cerebral infarction affecting left non-dominant side: Secondary | ICD-10-CM

## 2021-02-24 DIAGNOSIS — R2681 Unsteadiness on feet: Secondary | ICD-10-CM

## 2021-02-24 NOTE — Therapy (Addendum)
[facility] [address], [place], [place], [address] Phone: [phone]   Fax:  [phone]  Physical Therapy Treatment  Patient Details  Name: [name] MRN: [id number] Date of Birth: [birth year] Referring Provider (PT): Frann Rider, NP   Progress Note Reporting Period 02/18/21 to 02/24/21  See note below for Objective Data and Assessment of Progress/Goals.     Encounter Date: 02/24/2021   PT End of Session - 02/24/21 0855     Visit Number 2    Number of Visits 13    Date for PT Re-Evaluation 04/17/21    Authorization Type Medicare    Progress Note Due on Visit 10    PT Start Time 0757    PT Stop Time 0848    PT Time Calculation (min) 51 min    Activity Tolerance Patient limited by fatigue;Patient tolerated treatment well    Behavior During Therapy [facility] for tasks assessed/performed             Past Medical History:  Diagnosis Date   Acute pancreatitis after ERCP  01/22/2012   Arthritis    EPIGASTRIC PAIN, CHRONIC 03/22/2008        Gastrointestinal stromal tumor ([place]) March 18, 2007   S/P Wedge resection   GERD (gastroesophageal reflux disease)    Hypertension    Internal hemorrhoids    Vertigo     Past Surgical History:  Procedure Laterality Date   BREAST BIOPSY Right 2009   CHOLECYSTECTOMY     COLONOSCOPY     ERCP  01/21/2012   Procedure: ENDOSCOPIC RETROGRADE CHOLANGIOPANCREATOGRAPHY (ERCP);  Surgeon: Gatha Mayer, MD;  Location: [facility];  Service: Endoscopy;  Laterality: N/A;  Needs interpreter- vietnanese   ESOPHAGOGASTRODUODENOSCOPY     gist resection     TRANSCAROTID ARTERY REVASCULARIZATION  Right 11/12/2020   Procedure: RIGHT TRANSCAROTID ARTERY REVASCULARIZATION;  Surgeon: Cherre Robins, MD;  Location: [facility];  Service: Vascular;  Laterality: Right;    There were no vitals filed for this visit.   Subjective Assessment - 02/24/21 0751     Subjective Feeling a bit  better after practicing HEP at home. Has been sitting and standing up more. Still notes dizziness and weakness in her hands, leading to dropping things. Unsure of what causes dizziness, however it seems to come on when she waalks, not when she lays down.    Patient is accompained by: --    Patient Stated Goals Pt's goal for therapy is to have stability and strength in legs and hands.    Currently in Pain? No/denies                               [facility] Adult PT Treatment/Exercise - 02/24/21 0001       Exercises   Exercises Knee/Hip      Knee/Hip Exercises: Aerobic   Nustep L1.0 x 3 min   break after each minute, slow     Knee/Hip Exercises: Standing   Hip Flexion Stengthening;Both;1 set;10 reps    Hip Flexion Limitations marching at II bars    Hip Abduction Stengthening;Right;Left;1 set;5 reps;Knee straight    Abduction Limitations at II bars      Knee/Hip Exercises: Seated   Long Arc Quad AROM;Strengthening;Right;Left;2 sets;5 reps    Long Arc Quad Limitations alt LEs; quick to fatigue, rest breaks given   pillow behind back   Knee/Hip Flexion sitting march  2x10   pillow behind back   Other Seated Knee/Hip Exercises B sitting ankle DF AROM 10x    Sit to Sand 1 set;5 reps;with UE support   CGA; slow; c/o lightheadedness upon standing which dissipates once seated                    PT Education - 02/24/21 0852     Education Details edu on typical stroke symptoms and discussion on patient's dizziness symptoms    Person(s) Educated Patient    Methods Explanation    Comprehension Verbalized understanding              PT Short Term Goals - 02/24/21 0857       PT SHORT TERM GOAL #1   Title Pt will perform HEP with family supervision for improved strength, balance, transfers, and gait.  TARGET 03/20/2021    Time 5    Period Weeks    Status On-going      PT SHORT TERM GOAL #2   Title Pt will improve 5x sit<>stand with minimal to no UE support  to demonstrate improved functional strength and transfer efficiency.    Baseline performs x 1 only due to fatigue    Time 5    Period Weeks    Status On-going      PT SHORT TERM GOAL #3   Title Pt will improve TUG score to less than or equal to 25 sec for decreased fall risk.    Baseline 31.56 sec    Time 5    Period Weeks    Status On-going      PT SHORT TERM GOAL #4   Title Pt will ambulate at least 50 ft x 4 reps in gym area with cane and supervision, for improved independence and safety with household gait.    Time 5    Period Weeks    Status On-going      PT SHORT TERM GOAL #5   Title Further vestibular work-up and goals written as needed.    Status On-going               PT Long Term Goals - 02/24/21 0857       PT LONG TERM GOAL #1   Title Pt will perform HEP with family supervision for improved strength, balance, transfers, and gait.  TARGET 04/17/2021    Time 9    Period Weeks    Status On-going      PT LONG TERM GOAL #2   Title Pt will improve gait velocity to at least 1.3 ft/sec for improved gait efficiency and safety.    Baseline 0.75 ft/sec    Time 9    Period Weeks    Status On-going      PT LONG TERM GOAL #3   Title Pt will improve TUG score to less than or equal to 18 sec for decreased fall risk.    Baseline 31.56 sec at eval    Time 9    Period Weeks    Status On-going      PT LONG TERM GOAL #4   Title Pt will ambulate at least 300 ft without rest breaks, using cane, supervision, for improved gait independence in home and short distance community.    Time 9    Period Weeks    Status On-going                   Plan - 02/24/21 9449  Clinical Impression Statement Patient arrived to session with report of feeling better than last session and reports increased time spent sitting and standing than before. Spent time explaining typical stroke symptoms as patient with limited understanding of her post-stroke symptoms. Also discussed  aggravating factors for her dizziness to help differentiate possible causes. Notes dizziness worse with walking, not bed mobility. Orthostatics in sitting>standing appeared normal. Proceeded with session while monitoring patient's symptoms. Patient requires several short rest breaks after ther-ex d/t fatigue. Encouraged patient to bring in 4WW next session for improved stability and comfort as patient requires HHA or furniture walking without AD. Patient reported understanding and noted some fatigue upon leaving.    Comorbidities PMH includes: vertigo, HTN, and GERD    PT Treatment/Interventions ADLs/Self Care Home Management;Neuromuscular re-education;Balance training;Therapeutic exercise;DME Instruction;Therapeutic activities;Functional mobility training;Gait training;Patient/family education;Vestibular    PT Next Visit Plan Further vestibular assessment as needed due to pt's c/o dizziness; try cane for gait; continue strengthening, balance, and HEP progression.  Monitor vitals    Consulted and Agree with Plan of Care Patient;Family member/caregiver    Family Member Consulted Daughter             Patient will benefit from skilled therapeutic intervention in order to improve the following deficits and impairments:  Abnormal gait, Difficulty walking, Decreased endurance, Decreased activity tolerance, Decreased balance, Decreased mobility, Decreased strength, Postural dysfunction, Impaired sensation  Visit Diagnosis: Hemiparesis affecting left side as late effect of cerebrovascular accident [facility])  Other abnormalities of gait and mobility  Muscle weakness (generalized)  Unsteadiness on feet  Abnormal posture  Dizziness and giddiness     Problem List Patient Active Problem List   Diagnosis Date Noted   Dyspnea 11/17/2020   Protein-calorie malnutrition, severe 11/09/2020   Carotid stenosis, right 11/06/2020   Hyperlipidemia 11/06/2020   CVA (cerebral vascular accident) ([place])  11/05/2020   Rectal bleeding 06/30/2018   Palpitations 10/19/2016   Lightheadedness 10/19/2016   GERD (gastroesophageal reflux disease) 10/12/2012   Dilated extrahepatic bile ducts 01/21/2012   VERTIGO 06/04/2008   HEADACHE 06/04/2008   Insomnia 04/01/2008   EPIGASTRIC PAIN, CHRONIC 03/22/2008   PERSONAL HISTORY GIST,  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/2007     Janene Harvey, PT, DPT 02/24/21 8:59 AM   [facility] [address], [place], [place], [address] Phone: [phone]   Fax:  [phone]  Name: [name] MRN: [id number] Date of Birth: [birth year]   PHYSICAL THERAPY DISCHARGE SUMMARY  Visits from Start of Care: 2  Current functional level related to goals / functional outcomes: Unable to assess; patient did not return   Remaining deficits: Unable to assess   Education / Equipment: HEP  Plan: Patient agrees to discharge.  Patient goals were not met. Patient is being discharged due to not returning to PT.    Janene Harvey, PT, DPT 04/22/21 1:27 PM

## 2021-03-02 ENCOUNTER — Encounter (HOSPITAL_COMMUNITY): Payer: Self-pay

## 2021-03-02 ENCOUNTER — Emergency Department (HOSPITAL_COMMUNITY): Payer: Medicare Other

## 2021-03-02 ENCOUNTER — Emergency Department (HOSPITAL_COMMUNITY)
Admission: EM | Admit: 2021-03-02 | Discharge: 2021-03-02 | Disposition: A | Discharge status: Home / Self Care | Payer: Medicare Other | Attending: Emergency Medicine | Admitting: Emergency Medicine

## 2021-03-02 ENCOUNTER — Other Ambulatory Visit: Payer: Self-pay

## 2021-03-02 DIAGNOSIS — Z79899 Other long term (current) drug therapy: Secondary | ICD-10-CM | POA: Insufficient documentation

## 2021-03-02 DIAGNOSIS — I10 Essential (primary) hypertension: Secondary | ICD-10-CM | POA: Insufficient documentation

## 2021-03-02 DIAGNOSIS — R519 Headache, unspecified: Secondary | ICD-10-CM | POA: Insufficient documentation

## 2021-03-02 DIAGNOSIS — Z87891 Personal history of nicotine dependence: Secondary | ICD-10-CM | POA: Diagnosis not present

## 2021-03-02 DIAGNOSIS — Z20822 Contact with and (suspected) exposure to covid-19: Secondary | ICD-10-CM | POA: Insufficient documentation

## 2021-03-02 DIAGNOSIS — R531 Weakness: Secondary | ICD-10-CM | POA: Insufficient documentation

## 2021-03-02 DIAGNOSIS — R102 Pelvic and perineal pain: Secondary | ICD-10-CM | POA: Diagnosis not present

## 2021-03-02 DIAGNOSIS — Z7982 Long term (current) use of aspirin: Secondary | ICD-10-CM | POA: Insufficient documentation

## 2021-03-02 DIAGNOSIS — R110 Nausea: Secondary | ICD-10-CM | POA: Diagnosis not present

## 2021-03-02 DIAGNOSIS — R638 Other symptoms and signs concerning food and fluid intake: Secondary | ICD-10-CM | POA: Diagnosis not present

## 2021-03-02 LAB — URINALYSIS, ROUTINE W REFLEX MICROSCOPIC
Bilirubin Urine: NEGATIVE
Glucose, UA: NEGATIVE mg/dL
Hgb urine dipstick: NEGATIVE
Ketones, ur: NEGATIVE mg/dL
Leukocytes,Ua: NEGATIVE
Nitrite: NEGATIVE
Protein, ur: NEGATIVE mg/dL
Specific Gravity, Urine: 1.003 — ABNORMAL LOW (ref 1.005–1.030)
pH: 7 (ref 5.0–8.0)

## 2021-03-02 LAB — CBC
HCT: 42.9 % (ref 36.0–46.0)
Hemoglobin: 14.8 g/dL (ref 12.0–15.0)
MCH: 28.7 pg (ref 26.0–34.0)
MCHC: 34.5 g/dL (ref 30.0–36.0)
MCV: 83.1 fL (ref 80.0–100.0)
Platelets: 223 10*3/uL (ref 150–400)
RBC: 5.16 MIL/uL — ABNORMAL HIGH (ref 3.87–5.11)
RDW: 12.2 % (ref 11.5–15.5)
WBC: 5.9 10*3/uL (ref 4.0–10.5)
nRBC: 0 % (ref 0.0–0.2)

## 2021-03-02 LAB — COMPREHENSIVE METABOLIC PANEL
ALT: 31 U/L (ref 0–44)
AST: 31 U/L (ref 15–41)
Albumin: 4.4 g/dL (ref 3.5–5.0)
Alkaline Phosphatase: 75 U/L (ref 38–126)
Anion gap: 8 (ref 5–15)
BUN: 17 mg/dL (ref 8–23)
CO2: 26 mmol/L (ref 22–32)
Calcium: 9 mg/dL (ref 8.9–10.3)
Chloride: 103 mmol/L (ref 98–111)
Creatinine, Ser: 0.78 mg/dL (ref 0.44–1.00)
GFR, Estimated: >60 mL/min (ref >60)
Glucose, Bld: 91 mg/dL (ref 70–99)
Potassium: 3.5 mmol/L (ref 3.5–5.1)
Sodium: 137 mmol/L (ref 135–145)
Total Bilirubin: 0.8 mg/dL (ref 0.3–1.2)
Total Protein: 7.5 g/dL (ref 6.5–8.1)

## 2021-03-02 LAB — LIPASE, BLOOD: Lipase: 52 U/L — ABNORMAL HIGH (ref 11–51)

## 2021-03-02 LAB — TROPONIN I (HIGH SENSITIVITY): Troponin I (High Sensitivity): 5 ng/L (ref <18)

## 2021-03-02 MED ORDER — ONDANSETRON 8 MG PO TBDP: 8.0000 mg | ORAL_TABLET | Freq: Three times a day (TID) | ORAL | 0 refills | Status: AC | PRN

## 2021-03-02 MED ORDER — ONDANSETRON HCL 4 MG/2ML IJ SOLN
4.0000 mg | Freq: Once | INTRAMUSCULAR | Status: AC
  Administered 2021-03-02: 4 mg via INTRAVENOUS
  Filled 2021-03-02: qty 2

## 2021-03-02 MED ORDER — SODIUM CHLORIDE 0.9 % IV BOLUS
1000.0000 mL | Freq: Once | INTRAVENOUS | Status: AC
  Administered 2021-03-02: 1000 mL via INTRAVENOUS

## 2021-03-02 MED ORDER — IOHEXOL 350 MG/ML SOLN
75.0000 mL | Freq: Once | INTRAVENOUS | Status: AC | PRN
  Administered 2021-03-02: 75 mL via INTRAVENOUS

## 2021-03-02 NOTE — Discharge Instructions (Addendum)
It was our pleasure to provide your ER care today - we hope that you feel better.  Drink plenty of fluids/make sure to stay well hydrated. If nauseated, you may take zofran as need.   Overall your ED tests look good. We did send a covid/flu swab the results of which should be back in the next couple of hours - you may check MyChart or call for results.   Follow up with primary care doctor in the next 3-4 days if symptoms fail to improve/resolve.  Return to ER if worse, new symptoms, fevers, new or severe pain, chest pain, trouble breathing, weak/fainting, one-side of body numbness/weakness, change in speech or vision, or other concern.

## 2021-03-02 NOTE — ED Provider Notes (Signed)
[facility] DEPT Provider Note   CSN: [id number] Arrival date & time: 03/02/21  0931     History Chief Complaint  Patient presents with   Weakness    [name] is a 75 y.o. female.  Patient w hx cva, c/o generalized weakness in the past three days, with poor appetite, nausea, decreased po intake. Symptoms acute onset, moderate, constant, persistent. No trauma/fall. No focal or unilateral numbness/weakness. Dull headache, gradual onset, moderate. No eye pain or change in vision. No neck pain or stiffness. No sinus drainage or pain. No fever or chills. No chest pain or discomfort. No sob. No cough or uri symptoms. No abd pain or nvd. No dysuria.   The history is provided by the patient and medical records.  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   GERD (gastroesophageal reflux disease)    Hypertension    Internal hemorrhoids    Vertigo     Patient Active Problem List   Diagnosis Date Noted   Dyspnea 11/17/2020   Protein-calorie malnutrition, severe 11/09/2020   Carotid stenosis, right 11/06/2020   Hyperlipidemia 11/06/2020   CVA (cerebral vascular accident) ([place]) 11/05/2020   Rectal bleeding 06/30/2018   Palpitations 10/19/2016   Lightheadedness 10/19/2016   GERD (gastroesophageal reflux disease) 10/12/2012   Dilated extrahepatic bile ducts 01/21/2012   VERTIGO 06/04/2008   HEADACHE 06/04/2008   Insomnia 04/01/2008   EPIGASTRIC PAIN, CHRONIC 03/22/2008   PERSONAL HISTORY GIST,  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 OR;  Service: Vascular;  Laterality: Right;     OB History   No obstetric history on file.     Family History  Problem Relation Age of Onset   Colon cancer Neg Hx     Social History   Tobacco Use   Smoking status: Former    Packs/day: 0.50    Years: 40.00    Pack years: 20.00    Types: Cigarettes    Quit date: 04/07/2003    Years since quitting: 17.9   Smokeless tobacco: Never  Vaping Use   Vaping Use: Never used  Substance Use Topics   Alcohol use: No   Drug use: No    Home Medications Prior to Admission medications   Medication Sig Start Date End Date Taking? Authorizing Provider  acetaminophen (TYLENOL) 325 MG tablet Take 650 mg by mouth every 6 (six) hours as needed for mild pain or headache.    [provider]  amitriptyline (ELAVIL) 50 MG tablet TAKE 1 TABLET(50 MG) BY MOUTH AT BEDTIME Patient taking differently: Take  50 mg by mouth at bedtime. 08/11/20   Gatha Mayer, MD  amLODipine (NORVASC) 10 MG tablet Take 10 mg by mouth daily.      [provider]  aspirin EC 81 MG EC tablet Take 1 tablet (81 mg total) by mouth daily. Swallow whole. 11/14/20 11/09/21  Darliss Cheney, MD  Cholecalciferol (VITAMIN D3 PO) Take 1 capsule by mouth daily with breakfast.    [provider]  meclizine (ANTIVERT) 12.5 MG tablet Take 12.5 mg by mouth 2 (two) times daily as needed for dizziness. 10/27/20   [provider]  pantoprazole (PROTONIX) 40 MG tablet TAKE 1 TABLET(40 MG) BY MOUTH  DAILY Patient taking differently: Take 40 mg by mouth daily. 08/11/20   Gatha Mayer, MD  rosuvastatin (CRESTOR) 20 MG tablet Take 1 tablet (20 mg total) by mouth daily. 12/09/20 01/08/21  Karoline Caldwell, PA-C    Allergies    Patient has no known allergies.  Review of Systems   Review of Systems  Constitutional:  Negative for fever.  HENT:  Negative for sore throat.   Eyes:  Negative for pain, redness and visual disturbance.  Respiratory:  Negative for cough and shortness of breath.   Cardiovascular:  Negative for chest pain.  Gastrointestinal:  Positive for nausea. Negative for abdominal pain.  Genitourinary:  Negative for dysuria and flank pain.  Musculoskeletal:  Negative for back pain and neck pain.  Skin:  Negative for rash.  Neurological:  Positive for weakness and headaches. Negative for speech difficulty and numbness.  Hematological:  Does not bruise/bleed easily.  Psychiatric/Behavioral:  Negative for confusion.    Physical Exam Updated Vital Signs BP (!) 150/70 (BP Location: Right Arm)   Pulse 89   Temp (!) 97.4 F (36.3 C)   Resp 20   Ht 1.549 m (5\' 1" )   Wt 43 kg   SpO2 97%   BMI 17.91 kg/m   Physical Exam Vitals and nursing note reviewed.  Constitutional:      Appearance: Normal appearance. She is well-developed.  HENT:     Head: Atraumatic.     Comments: No sinus or temporal tenderness.     Right Ear: Tympanic membrane normal.     Left Ear: Tympanic membrane normal.     Nose: Nose normal.     Mouth/Throat:     Mouth: Mucous membranes are moist.  Eyes:     General: No scleral icterus.    Conjunctiva/sclera: Conjunctivae normal.     Pupils: Pupils are equal, round, and reactive to light.  Neck:     Vascular: No carotid bruit.     Trachea: No tracheal deviation.     Comments: No stiffness or rigidity, no meningeal signs.  Cardiovascular:     Rate and Rhythm: Normal rate and regular rhythm.     Pulses: Normal pulses.     Heart sounds: Normal heart  sounds. No murmur heard.   No friction rub. No gallop.  Pulmonary:     Effort: Pulmonary effort is normal. No respiratory distress.     Breath sounds: Normal breath sounds.  Abdominal:     General: Bowel sounds are normal. There is no distension.     Palpations: Abdomen is soft.     Tenderness: There is no abdominal tenderness. There is no guarding.  Genitourinary:    Comments: No cva tenderness.  Musculoskeletal:        General: No swelling or tenderness.     Cervical back: Normal range of  motion and neck supple. No rigidity. No muscular tenderness.  Skin:    General: Skin is warm and dry.     Findings: No rash.  Neurological:     Mental Status: She is alert.     Comments: Alert, speech normal. No gross dysarthria or aphasia. No new focal deficit on neuro exam. Moves bilateral extremities purposefully.   Psychiatric:        Mood and Affect: Mood normal.    ED Results / Procedures / Treatments   Labs (all labs ordered are listed, but only abnormal results are displayed) Results for orders placed or performed during the hospital encounter of 03/02/21  CBC  Result Value Ref Range   WBC 5.9 4.0 - 10.5 K/uL   RBC 5.16 (H) 3.87 - 5.11 MIL/uL   Hemoglobin 14.8 12.0 - 15.0 g/dL   HCT 42.9 36.0 - 46.0 %   MCV 83.1 80.0 - 100.0 fL   MCH 28.7 26.0 - 34.0 pg   MCHC 34.5 30.0 - 36.0 g/dL   RDW 12.2 11.5 - 15.5 %   Platelets 223 150 - 400 K/uL   nRBC 0.0 0.0 - 0.2 %  Comprehensive metabolic panel  Result Value Ref Range   Sodium 137 135 - 145 mmol/L   Potassium 3.5 3.5 - 5.1 mmol/L   Chloride 103 98 - 111 mmol/L   CO2 26 22 - 32 mmol/L   Glucose, Bld 91 70 - 99 mg/dL   BUN 17 8 - 23 mg/dL   Creatinine, Ser 0.78 0.44 - 1.00 mg/dL   Calcium 9.0 8.9 - 10.3 mg/dL   Total Protein 7.5 6.5 - 8.1 g/dL   Albumin 4.4 3.5 - 5.0 g/dL   AST 31 15 - 41 U/L   ALT 31 0 - 44 U/L   Alkaline Phosphatase 75 38 - 126 U/L   Total Bilirubin 0.8 0.3 - 1.2 mg/dL   GFR, Estimated >60 >60 mL/min    Anion gap 8 5 - 15  Urinalysis, Routine w reflex microscopic Urine, Clean Catch  Result Value Ref Range   Color, Urine COLORLESS (A) YELLOW   APPearance CLEAR CLEAR   Specific Gravity, Urine 1.003 (L) 1.005 - 1.030   pH 7.0 5.0 - 8.0   Glucose, UA NEGATIVE NEGATIVE mg/dL   Hgb urine dipstick NEGATIVE NEGATIVE   Bilirubin Urine NEGATIVE NEGATIVE   Ketones, ur NEGATIVE NEGATIVE mg/dL   Protein, ur NEGATIVE NEGATIVE mg/dL   Nitrite NEGATIVE NEGATIVE   Leukocytes,Ua NEGATIVE NEGATIVE  Lipase, blood  Result Value Ref Range   Lipase 52 (H) 11 - 51 U/L  Troponin I (High Sensitivity)  Result Value Ref Range   Troponin I (High Sensitivity) 5 <18 ng/L     EKG EKG Interpretation  Date/Time:  Monday March 02 2021 09:57:03 EST Ventricular Rate:  78 PR Interval:  176 QRS Duration: 73 QT Interval:  393 QTC Calculation: 448 R Axis:   83 Text Interpretation: Sinus rhythm No significant change since last tracing Confirmed by Lajean Saver [phone]) on 03/02/2021 10:08:04 AM  Radiology CT Head Wo Contrast  Result Date: 03/02/2021 CLINICAL DATA:  Mental status change.  Headaches, vomiting EXAM: CT HEAD WITHOUT CONTRAST TECHNIQUE: Contiguous axial images were obtained from the base of the skull through the vertex without intravenous contrast. COMPARISON:  11/05/2020 FINDINGS: Brain: No evidence of acute infarction, hemorrhage, extra-axial collection, ventriculomegaly, or mass effect. Generalized cerebral atrophy. Vascular: Cerebrovascular atherosclerotic calcifications are noted. Skull: Negative for fracture or focal lesion. Sinuses/Orbits: Visualized  portions of the orbits are unremarkable. Visualized portions of the paranasal sinuses are unremarkable. Visualized portions of the mastoid air cells are unremarkable. Other: None. IMPRESSION: No acute intracranial pathology. Electronically Signed   By: Kathreen Devoid M.D.   On: 03/02/2021 10:42   CT Abdomen Pelvis W Contrast  Result Date:  03/02/2021 CLINICAL DATA:  75 year old female with acute abdominal and pelvic pain. EXAM: CT ABDOMEN AND PELVIS WITH CONTRAST TECHNIQUE: Multidetector CT imaging of the abdomen and pelvis was performed using the standard protocol following bolus administration of intravenous contrast. CONTRAST:  50mL OMNIPAQUE IOHEXOL 350 MG/ML SOLN COMPARISON:  03/05/2014 and prior CTs FINDINGS: Lower chest: No acute abnormality Hepatobiliary: Hepatic cysts are unchanged. No other hepatic abnormalities are noted. There is a suggestion of possible choledocholithiasis within the LOWER CBD but relatively unchanged from 2015. CBD measures 11 mm, but not significantly changed from 2015. Patient is status post cholecystectomy. Pancreas: Unremarkable Spleen: Unremarkable Adrenals/Urinary Tract: The kidneys, adrenal glands and bladder are unremarkable. Stomach/Bowel: Stomach is within normal limits. Appendix appears normal. No evidence of bowel wall thickening, distention, or inflammatory changes. Vascular/Lymphatic: Moderate calcified and noncalcified aortic atherosclerosis. No enlarged abdominal or pelvic lymph nodes. Reproductive: Uterus and bilateral adnexa are unremarkable. Other: No ascites, focal collection or pneumoperitoneum. Musculoskeletal: No acute or suspicious bony abnormalities are noted. IMPRESSION: 1. No evidence of acute abnormality. 2. Question choledocholithiasis within the LOWER CBD and mildly dilated CBD, not significantly changed from 2015. 3. Aortic Atherosclerosis (ICD10-I70.0). Electronically Signed   By: Margarette Canada M.D.   On: 03/02/2021 13:17    Procedures Procedures   Medications Ordered in ED Medications - No data to display  ED Course  I have reviewed the triage vital signs and the nursing notes.  Pertinent labs & imaging results that were available during my care of the patient were reviewed by me and considered in my medical decision making (see chart for details).    MDM  Rules/Calculators/A&P                          Iv ns bolus. Stat labs. Imaging.   Reviewed nursing notes and prior charts for additional history.   Labs reviewed/interpreted by me - wbc and hgb normal. Trop normal. Ua neg for uti.   CT reviewed/interpreted by me -  no hem.   On recheck, also c/o abd pain, mid to right abd. Will get ct.   CT abd reviewed/interpreted by me - no acute process.  Po fluids, food. Ambulate.   Recheck chest cta, no increased wob. No N/V/D while in ED. Recheck abd soft nt. Pt currently denies pain. Will add covid/flu swab as other ED workup largely unremarkable for acute process.   Pt appears stable for d/c. Rec close pcp f/u.  Return precautions provided.        Final Clinical Impression(s) / ED Diagnoses Final diagnoses:  None    Rx / DC Orders ED Discharge Orders     None        Lajean Saver, MD 03/02/21 1458

## 2021-03-02 NOTE — ED Triage Notes (Signed)
Pt BIB [facility] from home. Daughter states pt has not been eating or drinking for 3 days along with vomiting. Pt also has some increased weakness.

## 2021-03-02 NOTE — ED Notes (Signed)
Pure wick placed will give urine sample when able too.

## 2021-03-03 ENCOUNTER — Ambulatory Visit: Payer: Self-pay | Admitting: Physical Therapy

## 2021-03-10 ENCOUNTER — Ambulatory Visit: Payer: Self-pay | Admitting: Physical Therapy

## 2021-03-17 ENCOUNTER — Encounter: Payer: Medicare Other | Admitting: Occupational Therapy

## 2021-03-17 ENCOUNTER — Ambulatory Visit: Payer: Self-pay | Admitting: Physical Therapy

## 2021-04-24 ENCOUNTER — Encounter: Payer: Self-pay | Admitting: Physical Therapy

## 2021-04-24 NOTE — Therapy (Signed)
[facility] [address] [place], [place], [address] Phone: [phone]   Fax:  [phone]  Patient Details  Name: [name] MRN: [id number] Date of Birth: 1945-11-07 Referring Provider:  Frann Rider, NP  Encounter Date: 04/24/2021  PHYSICAL THERAPY DISCHARGE SUMMARY  Visits from Start of Care: 2  Current functional level related to goals / functional outcomes: See PT eval (pt only seen for 1 visit post eval)   Remaining deficits: See eval   Education / Equipment: Unable to fully address, as pt cancelled appointments and was only seen for one visit after eval.   Patient agrees to discharge. Patient goals were not met. Patient is being discharged due to not returning since the last visit. (02/24/2021)   Lenita Peregrina W., PT 04/24/2021, 9:40 AM  [facility] [address] [place], [place], [address] Phone: [phone]   Fax:  [phone]

## 2021-05-13 ENCOUNTER — Ambulatory Visit (INDEPENDENT_AMBULATORY_CARE_PROVIDER_SITE_OTHER): Payer: Medicare Other | Admitting: Adult Health

## 2021-05-13 ENCOUNTER — Other Ambulatory Visit: Payer: Self-pay

## 2021-05-13 ENCOUNTER — Encounter: Payer: Self-pay | Admitting: Adult Health

## 2021-05-13 VITALS — BP 127/76 | HR 93 | Ht 61.0 in | Wt 94.5 lb

## 2021-05-13 DIAGNOSIS — I63231 Cerebral infarction due to unspecified occlusion or stenosis of right carotid arteries: Secondary | ICD-10-CM

## 2021-05-13 NOTE — Patient Instructions (Addendum)
Continue aspirin 81 mg daily  and Crestor for secondary stroke prevention  Ensure follow up with vascular surgery as advised - if you need to contact them sooner, you can contact them at [phone]  Unsure cause of your dizziness - try to increase your water intake, discuss increased dose of amlodipine with PCP as this could be contributing   Continue to follow up with PCP regarding cholesterol and blood pressure management  Maintain strict control of hypertension with blood pressure goal below 130/90 and cholesterol with LDL cholesterol (bad cholesterol) goal below 70 mg/dL.   Signs of a Stroke? Follow the BEFAST method:  Balance Watch for a sudden loss of balance, trouble with coordination or vertigo Eyes Is there a sudden loss of vision in one or both eyes? Or double vision?  Face: Ask the person to smile. Does one side of the face droop or is it numb?  Arms: Ask the person to raise both arms. Does one arm drift downward? Is there weakness or numbness of a leg? Speech: Ask the person to repeat a simple phrase. Does the speech sound slurred/strange? Is the person confused ? Time: If you observe any of these signs, call [phone].        Thank you for coming to see [place] at [facility]. I hope we have been able to provide you high quality care today.  You may receive a patient satisfaction survey over the next few weeks. We would appreciate your feedback and comments so that we may continue to improve ourselves and the health of our patients.

## 2021-05-13 NOTE — Progress Notes (Signed)
[facility] [address] [place]. [place] [address] [phone]       STROKE FOLLOW UP NOTE  [name] Date of Birth:  [birth year] Medical Record Number:  [id number]   Reason for Referral: stroke follow up    SUBJECTIVE:   CHIEF COMPLAINT:  Chief Complaint  Patient presents with   Follow-up    RM 3 with daughter and interpreter: here for 4 month f/u pt reports- she has been feeling better no complaints.    HPI:   Update 05/13/2021 JM: Returns for 4 months stroke follow-up accompanied by daughter and [place] interpreter.  Overall stable without new stroke/TIA symptoms. Reports improvement of leg weakness. Does c/o of intermittent numbness/tingling at incision line where TCAR was performed - has been present since procedure.  Compliant on aspirin and Crestor without side effects.  Blood pressure today 127/76. Does report occasional lightheadedness which seemed to worsen after increased dose of amlodipine but this is also not new. Admits to limited fluid intake during the day (approx 38 oz of water). Was previously working with PT but patient refused to go back to due increased fatigue during sessions - she gets limited to no physical activity or exercise.  No further concerns at this time.     History provided for reference purposes only Initial visit 12/30/2020 JM: [name] is being seen for hospital follow-up accompanied by daughter and [facility] interpreter as primary language. She reports fatigue, L leg weakness and some weakness RLE (after TCAR procedure), numbness in both hands and gait unsteadiness - has been gradually improving but not yet at baseline.  She has since completed home health therapies.  She ambulates currently without assistive device but daughter questions if cane is needed. When working with [facility] therapies, no cane or AD needed. Occasional quick zap/twitch sensation on left side of head which has been gradually improving  maybe occurring once every 2-3 days. A nurse continues to come once every week. Daughter is interested in additional outpatient therapies.  Remains on both aspirin and Plavix as well as Crestor.  Blood pressure today 126/74.  No further concerns at this time.  Stroke admission 11/05/2020 Ms. Mistee Soliman [name] is a 76 y.o. female with history of hypertension, GERD, and vertigo who presented to [facility] on 11/05/2020 for evaluation after an outpatient MRI brain, ordered by her PCP, identified multiple acute strokes. The MRI was ordered for evaluation of a one month hx of bitemporal headaches and left upper extremity weakness / numbness associated with right eye blurry vision in the inferior visual field.  She was transferred to [facility] ED for further evaluation.  Initially evaluated by Dr. Leonie Man for subacute right MCA infarcts secondary to symptomatic severe right ICA stenosis (80%) as evidenced on both CTA and carotid Dopplers. S/p TCAR 6/20 without complication.  EF 60 to 65%.  LDL 139 -initiate atorvastatin 80 mg daily but switched to Crestor due to elevated LFTs.  A1c 6.0 - no DM hx.  Recommended DAPT for 1 month post TCAR and then aspirin indefinitely.  HTN on amlodipine and recommend BP goal 130-160 prior to carotid revascularization.  Other stroke risk factors include advanced age and former tobacco use (quit 17 years ago). Therapy evals recommended HH PT/OT/SLP.      PERTINENT IMAGING  MR BRAIN 11/05/2020 IMPRESSION: 1. Subacute right MCA infarcts. 2. Mild chronic small vessel ischemic disease.  CTA HEAD/NECK 11/05/2020 IMPRESSION: 1. No intracranial arterial occlusion or high-grade stenosis. 2. 80%  stenosis of the proximal right internal carotid artery secondary to mixed density atherosclerosis.  CAROTID DUPLEX   11/06/2020 Summary:  Right Carotid: Velocities in the right ICA are consistent with a 80-99% stenosis.  Left Carotid: Velocities in the left ICA are consistent with a 1-39%  stenosis. Vertebrals: Bilateral vertebral arteries demonstrate antegrade flow.   12/09/2020 (S/P TCAR) Summary:  Right Carotid: There is no evidence of stenosis in the right ICA. Non-hemodynamically significant plaque <50% noted in the CCA. The ECA appears >50% stenosed. Patent stent with no evidence of restenosis.  Left Carotid: Velocities in the left ICA are consistent with a 1-39% stenosis. Non-hemodynamically significant plaque <50% noted in the CCA.  Vertebrals:  Bilateral vertebral arteries demonstrate antegrade flow.  Subclavians: Normal flow hemodynamics were seen in bilateral subclavian Arteries.    2D ECHO 11/08/2020 IMPRESSIONS   1. Left ventricular ejection fraction, by estimation, is 60 to 65%. The  left ventricle has normal function. The left ventricle has no regional  wall motion abnormalities. Left ventricular diastolic parameters were  normal.   2. Right ventricular systolic function was not well visualized. The right  ventricular size is normal. There is normal pulmonary artery systolic  pressure. The estimated right ventricular systolic pressure is 47.4 mmHg.   3. The mitral valve is normal in structure. No evidence of mitral valve  regurgitation. No evidence of mitral stenosis.   4. The aortic valve is tricuspid. Aortic valve regurgitation is trivial.  Mild to moderate aortic valve sclerosis/calcification is present, without  any evidence of aortic stenosis.   5. The inferior vena cava is normal in size with greater than 50%  respiratory variability, suggesting right atrial pressure of 3 mmHg.      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   GERD (gastroesophageal reflux disease)     Hypertension    Internal hemorrhoids    Vertigo     PSH:  Past Surgical History:  Procedure Laterality Date   BREAST BIOPSY Right 2009   CHOLECYSTECTOMY     COLONOSCOPY     ERCP  01/21/2012   Procedure: ENDOSCOPIC RETROGRADE CHOLANGIOPANCREATOGRAPHY (ERCP);  Surgeon: Gatha Mayer, MD;  Location: [facility];  Service: Endoscopy;  Laterality: N/A;  Needs interpreter- vietnanese   ESOPHAGOGASTRODUODENOSCOPY     gist resection     TRANSCAROTID ARTERY REVASCULARIZATION  Right 11/12/2020   Procedure: RIGHT TRANSCAROTID ARTERY REVASCULARIZATION;  Surgeon: Cherre Robins, MD;  Location: [facility] OR;  Service: Vascular;  Laterality: Right;    Social History:  Social History   Socioeconomic History   Marital status: Widowed    Spouse name: Not on file   Number of children: 1   Years of education: Not on file   Highest education level: Not on file  Occupational History   Occupation: UNEMPLOYED    Employer: UNEMPLOYED  Tobacco Use   Smoking status: Former    Packs/day: 0.50    Years: 40.00    Pack years: 20.00    Types: Cigarettes    Quit date: 04/07/2003    Years since quitting: 18.1   Smokeless tobacco: Never  Vaping Use   Vaping Use: Never used  Substance and Sexual Activity   Alcohol use: No   Drug use: No   Sexual activity: Not on file  Other Topics Concern   Not on file  Social History Narrative   Widowed   1 child   Former smoker   No EtOH/drugs      Social Determinants of Radio broadcast assistant Strain: Not on file  Food Insecurity: Not on file  Transportation Needs: Not on file  Physical Activity: Not on file  Stress: Not on file  Social Connections: Not on file  Intimate Partner Violence: Not on file    Family History:  Family History  Problem Relation Age of Onset   Colon cancer Neg Hx     Medications:   Current Outpatient Medications on File Prior to Visit  Medication Sig Dispense Refill   acetaminophen (TYLENOL) 325 MG tablet Take 650 mg  by mouth every 6 (six) hours as needed for mild pain or headache.     amitriptyline (ELAVIL) 50 MG tablet TAKE 1 TABLET(50 MG) BY MOUTH AT BEDTIME (Patient taking differently: Take 50 mg by mouth at bedtime.) 90 tablet 3   amLODipine (NORVASC) 10 MG tablet Take 10 mg by mouth daily.       aspirin EC 81 MG EC tablet Take 1 tablet (81 mg total) by mouth daily. Swallow whole. 30 tablet 11   Cholecalciferol (VITAMIN D3 PO) Take 1 capsule by mouth daily with breakfast.     meclizine (ANTIVERT) 12.5 MG tablet Take 12.5 mg by mouth 2 (two) times daily as needed for dizziness.     ondansetron (ZOFRAN ODT) 8 MG disintegrating tablet Take 1 tablet (8 mg total) by mouth every 8 (eight) hours as needed for nausea or vomiting. 10 tablet 0   pantoprazole (PROTONIX) 40 MG tablet TAKE 1 TABLET(40 MG) BY MOUTH DAILY (Patient taking differently: Take 40 mg by mouth daily.) 90 tablet 3   rosuvastatin (CRESTOR) 20 MG tablet Take 1 tablet (20 mg total) by mouth daily. 30 tablet 11   No current facility-administered medications on file prior to visit.    Allergies:  No Known Allergies    OBJECTIVE:  Physical Exam  Vitals:   05/13/21 0747  BP: 127/76  Pulse: 93  Weight: 94 lb 8 oz (42.9 kg)  Height: 5\' 1"  (1.549 m)    Body mass index is 17.86 kg/m. No results found.  General: Frail very pleasant elderly Asian female, seated, in no evident distress Head: head normocephalic and atraumatic.   Neck: supple with no carotid or supraclavicular bruits Cardiovascular: regular rate and rhythm, no murmurs Musculoskeletal: no deformity Skin:  no rash/petichiae Vascular:  Normal pulses all extremities   Neurologic Exam Mental Status: Awake and fully alert. [place] speaking only. Denies speech or language difficulties.  Oriented to place and time. Recent and remote memory intact. Attention span, concentration and fund of knowledge appropriate. Mood and affect appropriate.  Cranial Nerves: Pupils equal,  briskly reactive to light. Extraocular movements full without nystagmus. Visual fields full to confrontation. Hearing intact. Facial sensation intact. Face, tongue, palate moves normally and symmetrically.  Motor: Normal bulk and tone. Normal strength in all tested extremity muscles Sensory.: intact to touch , pinprick , position and vibratory sensation.  Subjective bilateral hand numbness Coordination: Rapid alternating movements normal in all extremities. Finger-to-nose and heel-to-shin performed accurately bilaterally.  Gait and Station: Arises from chair without difficulty. Stance is normal. Gait demonstrates decreased stride length and step height with mild unsteadiness and  slow cautious steps with use of cane. Tandem walk and heel toe not attempted.  Reflexes: 1+ and symmetric. Toes downgoing.        ASSESSMENT: Alecia Nuong Hayla Hinger is a 76 y.o. year old female with recent subacute right MCA infarcts secondary to symptomatic severe right ICA stenosis on 11/05/2020 s/p TCAR 11/12/2020. Vascular risk factors include HTN, HLD, right ICA stenosis and advanced age.      PLAN:  Right MCA strokes:  Continue aspirin 81 mg daily  and Crestor 40 mg daily for secondary stroke prevention.    discussed secondary stroke prevention measures and importance of close PCP follow up for aggressive stroke risk factor management. I have gone over the pathophysiology of stroke, warning signs and symptoms, risk factors and their management in some detail with instructions to go to the closest emergency room for symptoms of concern. Gait impairment: likely multifactorial in setting of prior stroke, limited to no routine physical activity or exercise and chronic joint pain. Discussed participating with therapies but she declines. Advised daughter to call if she wishes to pursue.  Advised use of cane at all times unless otherwise instructed. Right ICA stenosis:s/p TCAR. Followed by [facility]. Reports continued incision  numbness/tingling. Carotid duplex 8/23 showed patent bilateral ICAs without any stent restenosis.  [facility] plans to repeat carotid duplex 9 months postoperative visit (approx 08/2021).  Completed 1 months DAPT.  Continuation of aspirin and statin HTN: BP goal <130/90.  Stable on amlodipine managed by PCP. Unsure if dizziness/lightheadedness associated with amlodipine as reports worsening after increased dose. Encouraged checking BP during time of increased lightheadedness. Increase fluid intake, increase daily activity as tolerated.  HLD: LDL goal <70.  LDL 60 (04/21/2021) on Crestor 40 mg daily.  LFTs normalized.  Management prescribed by PCP   Overall stable -no further recommendations from our standpoint.  Continue to follow with PCP for aggressive stroke risk factor management. follow up as needed   CC:  PCP: Lavone Orn, MD    I spent 38 minutes of face-to-face and non-face-to-face time with patient and daughter assisted by interpreter.  This included previsit chart review, lab review, study review, electronic health record documentation, patient and daughter education regarding prior stroke including etiology, secondary stroke prevention measures and importance of managing stroke risk factors, gait impairment and dizziness, and answered all other questions to patient and daughters satisfaction   Frann Rider, [facility]  [facility] [address] [place], [place] [address]  Phone [phone] Fax [phone] Note: This document was prepared with digital dictation and possible smart phrase technology. Any transcriptional errors that result from this process are unintentional.

## 2021-08-06 ENCOUNTER — Other Ambulatory Visit: Payer: Self-pay | Admitting: Internal Medicine

## 2021-09-03 NOTE — Progress Notes (Signed)
HISTORY AND PHYSICAL     CC:  follow up. Requesting Provider:  Lavone Orn, MD  HPI: This is a 76 y.o. female here for follow up for carotid artery stenosis.  Pt is s/p right TCAR for symptomatic carotid artery stenosis on 11/12/2020 by Dr. Stanford Breed.   Pt had episodic dizziness and left sided weakness.  CTA revealed severe right ICA stenosis in July 2022.    Pt was last seen August 2022 and at that time she was having some left upper and lower extremity weakness and generalized weakness.  Otherwise, she did not have any new visual changes, amaurosis fugax, slurred speech, trouble swallowing, facial drooping, new weakness or numbness of her upper or lower extremities. She had been taking her Aspirin, statin and Plavix.  Pt returns today for follow up with her daughter who is translating.    Pt denies any amaurosis fugax, speech difficulties, weakness, numbness, paralysis or clumsiness or facial droop.   She does have some complaints about continued headaches on the right side and dizziness.  She also has some complaints about right sided back pain that radiates down the outside of her right leg.    She has been compliant with her asa and statin.  She is asking for refill on her crestor today.   The pt is on a statin for cholesterol management.  The pt is on a daily aspirin.   Other AC:  none The pt is on CCB for hypertension.   The pt does not have diabetes Tobacco hx:  former   Past Medical History:  Diagnosis Date   Acute pancreatitis after ERCP  01/22/2012   Arthritis    EPIGASTRIC PAIN, CHRONIC 03/22/2008        Gastrointestinal stromal tumor [facility]) March 18, 2007   S/P Wedge resection   GERD (gastroesophageal reflux disease)    Hypertension    Internal hemorrhoids    Vertigo     Past Surgical History:  Procedure Laterality Date   BREAST BIOPSY Right 2009   CHOLECYSTECTOMY     COLONOSCOPY     ERCP  01/21/2012   Procedure: ENDOSCOPIC RETROGRADE CHOLANGIOPANCREATOGRAPHY  (ERCP);  Surgeon: Gatha Mayer, MD;  Location: [facility];  Service: Endoscopy;  Laterality: N/A;  Needs interpreter- vietnanese   ESOPHAGOGASTRODUODENOSCOPY     gist resection     TRANSCAROTID ARTERY REVASCULARIZATION  Right 11/12/2020   Procedure: RIGHT TRANSCAROTID ARTERY REVASCULARIZATION;  Surgeon: Cherre Robins, MD;  Location: [facility] OR;  Service: Vascular;  Laterality: Right;    No Known Allergies  Current Outpatient Medications  Medication Sig Dispense Refill   acetaminophen (TYLENOL) 325 MG tablet Take 650 mg by mouth every 6 (six) hours as needed for mild pain or headache.     amitriptyline (ELAVIL) 50 MG tablet Take 1 tablet (50 mg total) by mouth at bedtime. Please schedule an office visit for further refills.  Thank you 30 tablet 1   amLODipine (NORVASC) 10 MG tablet Take 10 mg by mouth daily.       aspirin EC 81 MG EC tablet Take 1 tablet (81 mg total) by mouth daily. Swallow whole. 30 tablet 11   Cholecalciferol (VITAMIN D3 PO) Take 1 capsule by mouth daily with breakfast.     meclizine (ANTIVERT) 12.5 MG tablet Take 12.5 mg by mouth 2 (two) times daily as needed for dizziness.     ondansetron (ZOFRAN ODT) 8 MG disintegrating tablet Take 1 tablet (8 mg total) by mouth every 8 (eight) hours  as needed for nausea or vomiting. 10 tablet 0   pantoprazole (PROTONIX) 40 MG tablet Take 1 tablet (40 mg total) by mouth daily. Please schedule a follow up for further refills.  Last seen in 2021 30 tablet 1   rosuvastatin (CRESTOR) 20 MG tablet Take 1 tablet (20 mg total) by mouth daily. 30 tablet 11   No current facility-administered medications for this visit.    Family History  Problem Relation Age of Onset   Colon cancer Neg Hx     Social History   Socioeconomic History   Marital status: Widowed    Spouse name: Not on file   Number of children: 1   Years of education: Not on file   Highest education level: Not on file  Occupational History   Occupation: UNEMPLOYED     Employer: UNEMPLOYED  Tobacco Use   Smoking status: Former    Packs/day: 0.50    Years: 40.00    Pack years: 20.00    Types: Cigarettes    Quit date: 04/07/2003    Years since quitting: 18.4   Smokeless tobacco: Never  Vaping Use   Vaping Use: Never used  Substance and Sexual Activity   Alcohol use: No   Drug use: No   Sexual activity: Not on file  Other Topics Concern   Not on file  Social History Narrative   Widowed   1 child   Former smoker   No EtOH/drugs      Social Determinants of Radio broadcast assistant Strain: Not on file  Food Insecurity: Not on file  Transportation Needs: Not on file  Physical Activity: Not on file  Stress: Not on file  Social Connections: Not on file  Intimate Partner Violence: Not on file     REVIEW OF SYSTEMS:   '[X]'$  denotes positive finding, '[ ]'$  denotes negative finding Cardiac  Comments:  Chest pain or chest pressure:    Shortness of breath upon exertion:    Short of breath when lying flat:    Irregular heart rhythm:        Vascular    Pain in calf, thigh, or hip brought on by ambulation:    Pain in feet at night that wakes you up from your sleep:     Blood clot in your veins:    Leg swelling:         Pulmonary    Oxygen at home:    Productive cough:     Wheezing:         Neurologic    Sudden weakness in arms or legs:     Sudden numbness in arms or legs:  x See HPI  Sudden onset of difficulty speaking or slurred speech:    Temporary loss of vision in one eye:     Problems with dizziness:  x       Gastrointestinal    Blood in stool:     Vomited blood:         Genitourinary    Burning when urinating:     Blood in urine:        Psychiatric    Major depression:         Hematologic    Bleeding problems:    Problems with blood clotting too easily:        Skin    Rashes or ulcers:        Constitutional    Fever or chills:      PHYSICAL EXAMINATION:  Today's Vitals   09/10/21 0844 09/10/21 0846  BP:  121/63 130/64  Pulse: 92   Resp: 20   Temp: (!) 97.4 F (36.3 C)   TempSrc: Temporal   SpO2: 97%   Weight: 92 lb 3.2 oz (41.8 kg)   Height: '5\' 1"'$  (1.549 m)    Body mass index is 17.42 kg/m.   General:  WDWN in NAD; vital signs documented above Gait: Not observed HENT: WNL, normocephalic Pulmonary: normal non-labored breathing Cardiac: regular HR, without carotid bruits Skin: without rashes Vascular Exam/Pulses:  Right Left  Radial 2+ (normal) 2+ (normal)  DP 2+ normal 2+ normal   Extremities: without ischemic changes, without Gangrene , without cellulitis; without open wounds Musculoskeletal: no muscle wasting or atrophy  Neurologic: A&O X 3; moving all extremities equally; speech is fluent/normal Psychiatric:  The pt has Normal affect.   Non-Invasive Vascular Imaging:   Carotid Duplex on 09/10/2021 Right:  no evidence of stenosis in the right ICA.  Left:  1-39% ICA stenosis Vertebrals:  Bilateral vertebral arteries demonstrate antegrade flow.  Subclavians: Normal flow hemodynamics were seen in bilateral subclavian  arteries.  Previous Carotid duplex on 12/09/2020: Right: no evidence of ICA stenosis; patent stent Left:   1-39% ICA stenosis    ASSESSMENT/PLAN:: 76 y.o. female here for follow up carotid artery stenosis and s/p right TCAR for symptomatic carotid artery stenosis on 11/12/2020 by Dr. Stanford Breed.   -duplex today reveals no evidence of stenosis of the right ICA stent and the left remains in the 1-39% stenosis category.   -discussed s/s of stroke with pt and she understands should she develop any of these sx, she will go to the nearest ER or call 911. -pt will f/u in one year with carotid duplex -pt will call sooner should they have any issues. -continue statin/asa.  I have sent an rx for Crestor '20mg'$  daily #90 with 4 refills to her pharmacy.   -discussed with pt and her daughter that a referral to a spine or orthopedic surgeon for her right hip and leg pain  would come from her PCP as well as request for wheelchair.     Leontine Locket, [facility] [phone]  Clinic MD:  Virl Cagey

## 2021-09-10 ENCOUNTER — Ambulatory Visit (HOSPITAL_COMMUNITY)
Admission: RE | Admit: 2021-09-10 | Discharge: 2021-09-10 | Disposition: A | Discharge status: Home / Self Care | Payer: Medicare Other | Source: Ambulatory Visit | Attending: Vascular Surgery | Admitting: Vascular Surgery

## 2021-09-10 ENCOUNTER — Encounter: Payer: Self-pay | Admitting: Physician Assistant

## 2021-09-10 ENCOUNTER — Ambulatory Visit (INDEPENDENT_AMBULATORY_CARE_PROVIDER_SITE_OTHER): Payer: Medicare Other | Admitting: Physician Assistant

## 2021-09-10 VITALS — BP 130/64 | HR 92 | Temp 97.4°F | Resp 20 | Ht 61.0 in | Wt 92.2 lb

## 2021-09-10 DIAGNOSIS — I6523 Occlusion and stenosis of bilateral carotid arteries: Secondary | ICD-10-CM | POA: Diagnosis not present

## 2021-09-10 DIAGNOSIS — I6521 Occlusion and stenosis of right carotid artery: Secondary | ICD-10-CM

## 2021-09-10 MED ORDER — ROSUVASTATIN CALCIUM 20 MG PO TABS: 20.0000 mg | ORAL_TABLET | Freq: Every day | ORAL | 4 refills | Status: DC

## 2021-09-14 ENCOUNTER — Encounter (HOSPITAL_COMMUNITY): Payer: Self-pay | Admitting: Emergency Medicine

## 2021-09-14 ENCOUNTER — Ambulatory Visit (HOSPITAL_COMMUNITY)
Admission: EM | Admit: 2021-09-14 | Discharge: 2021-09-14 | Disposition: A | Discharge status: Home / Self Care | Payer: Medicare Other | Attending: Physician Assistant | Admitting: Physician Assistant

## 2021-09-14 ENCOUNTER — Ambulatory Visit (INDEPENDENT_AMBULATORY_CARE_PROVIDER_SITE_OTHER): Payer: Medicare Other

## 2021-09-14 DIAGNOSIS — R059 Cough, unspecified: Secondary | ICD-10-CM | POA: Diagnosis not present

## 2021-09-14 DIAGNOSIS — J069 Acute upper respiratory infection, unspecified: Secondary | ICD-10-CM

## 2021-09-14 DIAGNOSIS — R0789 Other chest pain: Secondary | ICD-10-CM

## 2021-09-14 MED ORDER — BENZONATATE 100 MG PO CAPS: 100.0000 mg | ORAL_CAPSULE | Freq: Three times a day (TID) | ORAL | 0 refills | Status: AC

## 2021-09-14 NOTE — Discharge Instructions (Addendum)
Can take Tessalon pearls as needed for cough Recommend Delsym or Robitussin DM cough syrup which is over the counter.  Drink plenty of fluids Return for evaluation if symptoms become worse.

## 2021-09-14 NOTE — ED Provider Notes (Signed)
[facility]    CSN: [id number] Arrival date & time: 09/14/21  1025      History   Chief Complaint Chief Complaint  Patient presents with   Cough    HPI [name] is a 76 y.o. female.   Pt complains of cough and chills that started 4 days ago.  She reports chest tightness with coughing fits, but denies shortness of breath or wheezing.  She denies fever, body aches.  She has taken nyquil and dayquil with temporary relief.  No other sick contacts in the home.  Pt has taken a home COVID test which was negative.    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   GERD (gastroesophageal reflux disease)    Hypertension    Internal hemorrhoids    Vertigo     Patient Active Problem List   Diagnosis Date Noted   Dyspnea 11/17/2020   Protein-calorie malnutrition, severe 11/09/2020   Carotid stenosis, right 11/06/2020   Hyperlipidemia 11/06/2020   CVA (cerebral vascular accident) ([place]) 11/05/2020   Rectal bleeding 06/30/2018   Palpitations 10/19/2016   Lightheadedness 10/19/2016   GERD (gastroesophageal reflux disease) 10/12/2012   Dilated extrahepatic bile ducts 01/21/2012   VERTIGO 06/04/2008   HEADACHE 06/04/2008   Insomnia 04/01/2008   EPIGASTRIC PAIN, CHRONIC 03/22/2008   PERSONAL HISTORY GIST,  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/2007    Past Surgical History:  Procedure Laterality Date   BREAST BIOPSY Right 2009   CHOLECYSTECTOMY     COLONOSCOPY     ERCP  01/21/2012   Procedure: ENDOSCOPIC RETROGRADE CHOLANGIOPANCREATOGRAPHY (ERCP);  Surgeon: Gatha Mayer, MD;  Location: [facility];  Service: Endoscopy;  Laterality: N/A;  Needs interpreter- vietnanese   ESOPHAGOGASTRODUODENOSCOPY     gist resection     TRANSCAROTID ARTERY REVASCULARIZATION  Right 11/12/2020   Procedure: RIGHT  TRANSCAROTID ARTERY REVASCULARIZATION;  Surgeon: Cherre Robins, MD;  Location: [facility];  Service: Vascular;  Laterality: Right;    OB History   No obstetric history on file.      Home Medications    Prior to Admission medications   Medication Sig Start Date End Date Taking? Authorizing Provider  benzonatate (TESSALON) 100 MG capsule Take 1 capsule (100 mg total) by mouth every 8 (eight) hours. 09/14/21  Yes Ward, Lenise Arena, PA-C  acetaminophen (TYLENOL) 325 MG tablet Take 650 mg by mouth every 6 (six) hours as needed for mild pain or headache.    [provider]  amitriptyline (ELAVIL) 50 MG tablet Take 1 tablet (50 mg total) by mouth at bedtime. Please schedule an office visit for further refills.  Thank you 08/06/21   Gatha Mayer, MD  amLODipine (NORVASC) 10 MG tablet Take 10 mg by mouth daily.      [provider]  aspirin EC 81 MG EC tablet Take 1 tablet (81 mg total) by mouth daily. Swallow whole. 11/14/20 11/09/21  Darliss Cheney, MD  Cholecalciferol (VITAMIN D3 PO) Take 1 capsule by mouth daily with breakfast.    [provider]  meclizine (ANTIVERT) 12.5 MG tablet Take 12.5 mg by mouth 2 (two) times daily as needed for dizziness. 10/27/20   [provider]  ondansetron (ZOFRAN ODT) 8 MG disintegrating tablet Take 1 tablet (8 mg total) by mouth every  8 (eight) hours as needed for nausea or vomiting. 03/02/21   Lajean Saver, MD  pantoprazole (PROTONIX) 40 MG tablet Take 1 tablet (40 mg total) by mouth daily. Please schedule a follow up for further refills.  Last seen in 2021 08/06/21   Gatha Mayer, MD  rosuvastatin (CRESTOR) 20 MG tablet Take 1 tablet (20 mg total) by mouth daily. 09/10/21   Gabriel Earing, PA-C    Family History Family History  Problem Relation Age of Onset   Colon cancer Neg Hx     Social History Social History   Tobacco Use   Smoking status: Former    Packs/day: 0.50    Years: 40.00    Pack years: 20.00     Types: Cigarettes    Quit date: 04/07/2003    Years since quitting: 18.4    Passive exposure: Never   Smokeless tobacco: Never  Vaping Use   Vaping Use: Never used  Substance Use Topics   Alcohol use: No   Drug use: No     Allergies   Patient has no known allergies.   Review of Systems Review of Systems  Constitutional:  Positive for chills. Negative for fever.  HENT:  Negative for ear pain and sore throat.   Eyes:  Negative for pain and visual disturbance.  Respiratory:  Positive for cough and chest tightness. Negative for shortness of breath.   Cardiovascular:  Negative for chest pain and palpitations.  Gastrointestinal:  Negative for abdominal pain and vomiting.  Genitourinary:  Negative for dysuria and hematuria.  Musculoskeletal:  Negative for arthralgias and back pain.  Skin:  Negative for color change and rash.  Neurological:  Negative for seizures and syncope.  All other systems reviewed and are negative.   Physical Exam Triage Vital Signs ED Triage Vitals [09/14/21 1213]  Enc Vitals Group     BP 114/73     Pulse Rate 82     Resp 16     Temp 98.2 F (36.8 C)     Temp Source Oral     SpO2 96 %     Weight      Height      Head Circumference      Peak Flow      Pain Score 8     Pain Loc      Pain Edu?      Excl. in [place]?    No data found.  Updated Vital Signs BP 114/73 (BP Location: Left Arm)   Pulse 82   Temp 98.2 F (36.8 C) (Oral)   Resp 16   SpO2 96%   Visual Acuity Right Eye Distance:   Left Eye Distance:   Bilateral Distance:    Right Eye Near:   Left Eye Near:    Bilateral Near:     Physical Exam Vitals and nursing note reviewed.  Constitutional:      General: She is not in acute distress.    Appearance: She is well-developed.  HENT:     Head: Normocephalic and atraumatic.  Eyes:     Conjunctiva/sclera: Conjunctivae normal.  Cardiovascular:     Rate and Rhythm: Normal rate and regular rhythm.     Heart sounds: No murmur  heard. Pulmonary:     Effort: Pulmonary effort is normal. No respiratory distress.     Breath sounds: Normal breath sounds.  Abdominal:     Palpations: Abdomen is soft.     Tenderness: There is no abdominal tenderness.  Musculoskeletal:  General: No swelling.     Cervical back: Neck supple.  Skin:    General: Skin is warm and dry.     Capillary Refill: Capillary refill takes less than 2 seconds.  Neurological:     Mental Status: She is alert.  Psychiatric:        Mood and Affect: Mood normal.     UC Treatments / Results  Labs (all labs ordered are listed, but only abnormal results are displayed) Labs Reviewed - No data to display  EKG   Radiology DG Chest 2 View  Result Date: 09/14/2021 CLINICAL DATA:  Cough and chest tightness. EXAM: CHEST - 2 VIEW COMPARISON:  11/17/2020. FINDINGS: Cardiac silhouette is normal in size and configuration. No mediastinal or hilar masses. No evidence of adenopathy. Lungs are hyperexpanded, but clear. No pleural effusion or pneumothorax. Skeletal structures are intact. IMPRESSION: No active cardiopulmonary disease. Electronically Signed   By: Lajean Manes M.D.   On: 09/14/2021 12:56    Procedures Procedures (including critical care time)  Medications Ordered in UC Medications - No data to display  Initial Impression / Assessment and Plan / UC Course  I have reviewed the triage vital signs and the nursing notes.  Pertinent labs & imaging results that were available during my care of the patient were reviewed by me and considered in my medical decision making (see chart for details).     Viral illness with cough.  Vitals wnl, pt overall well appearing, chest xray negative.  Will prescribed tessalon pearls, advised Robitussin DM cough syrup.  Discussed supportive care.  Advised follow up with primary care.  ED precautions given.  Final Clinical Impressions(s) / UC Diagnoses   Final diagnoses:  Viral URI with cough     Discharge  Instructions      Can take Tessalon pearls as needed for cough Recommend Delsym or Robitussin DM cough syrup which is over the counter.  Drink plenty of fluids Return for evaluation if symptoms become worse.      ED Prescriptions     Medication Sig Dispense Auth. Provider   benzonatate (TESSALON) 100 MG capsule Take 1 capsule (100 mg total) by mouth every 8 (eight) hours. 21 capsule Ward, Lenise Arena, PA-C      PDMP not reviewed this encounter.   Ward, Lenise Arena, PA-C 09/14/21 1301

## 2021-09-14 NOTE — ED Triage Notes (Signed)
Cough runny  nose since friday

## 2021-09-17 ENCOUNTER — Emergency Department (HOSPITAL_COMMUNITY): Payer: Medicare Other

## 2021-09-17 ENCOUNTER — Emergency Department (HOSPITAL_COMMUNITY)
Admission: EM | Admit: 2021-09-17 | Discharge: 2021-09-17 | Disposition: A | Discharge status: Home / Self Care | Payer: Medicare Other | Attending: Emergency Medicine | Admitting: Emergency Medicine

## 2021-09-17 ENCOUNTER — Encounter (HOSPITAL_COMMUNITY): Payer: Self-pay

## 2021-09-17 ENCOUNTER — Other Ambulatory Visit: Payer: Self-pay

## 2021-09-17 DIAGNOSIS — R42 Dizziness and giddiness: Secondary | ICD-10-CM | POA: Diagnosis present

## 2021-09-17 DIAGNOSIS — Z20822 Contact with and (suspected) exposure to covid-19: Secondary | ICD-10-CM | POA: Insufficient documentation

## 2021-09-17 DIAGNOSIS — R69 Illness, unspecified: Secondary | ICD-10-CM | POA: Diagnosis not present

## 2021-09-17 DIAGNOSIS — R0789 Other chest pain: Secondary | ICD-10-CM | POA: Diagnosis not present

## 2021-09-17 DIAGNOSIS — R0682 Tachypnea, not elsewhere classified: Secondary | ICD-10-CM | POA: Diagnosis not present

## 2021-09-17 DIAGNOSIS — R059 Cough, unspecified: Secondary | ICD-10-CM | POA: Insufficient documentation

## 2021-09-17 DIAGNOSIS — I10 Essential (primary) hypertension: Secondary | ICD-10-CM | POA: Insufficient documentation

## 2021-09-17 DIAGNOSIS — R112 Nausea with vomiting, unspecified: Secondary | ICD-10-CM | POA: Insufficient documentation

## 2021-09-17 DIAGNOSIS — Z7982 Long term (current) use of aspirin: Secondary | ICD-10-CM | POA: Diagnosis not present

## 2021-09-17 DIAGNOSIS — E876 Hypokalemia: Secondary | ICD-10-CM | POA: Diagnosis not present

## 2021-09-17 LAB — CBC WITH DIFFERENTIAL/PLATELET
Abs Immature Granulocytes: 0.01 10*3/uL (ref 0.00–0.07)
Basophils Absolute: 0 10*3/uL (ref 0.0–0.1)
Basophils Relative: 0 %
Eosinophils Absolute: 0 10*3/uL (ref 0.0–0.5)
Eosinophils Relative: 1 %
HCT: 35.1 % — ABNORMAL LOW (ref 36.0–46.0)
Hemoglobin: 11.8 g/dL — ABNORMAL LOW (ref 12.0–15.0)
Immature Granulocytes: 0 %
Lymphocytes Relative: 7 %
Lymphs Abs: 0.4 10*3/uL — ABNORMAL LOW (ref 0.7–4.0)
MCH: 29.5 pg (ref 26.0–34.0)
MCHC: 33.6 g/dL (ref 30.0–36.0)
MCV: 87.8 fL (ref 80.0–100.0)
Monocytes Absolute: 0.3 10*3/uL (ref 0.1–1.0)
Monocytes Relative: 6 %
Neutro Abs: 5.2 10*3/uL (ref 1.7–7.7)
Neutrophils Relative %: 86 %
Platelets: 148 10*3/uL — ABNORMAL LOW (ref 150–400)
RBC: 4 MIL/uL (ref 3.87–5.11)
RDW: 12 % (ref 11.5–15.5)
WBC: 6 10*3/uL (ref 4.0–10.5)
nRBC: 0 % (ref 0.0–0.2)

## 2021-09-17 LAB — LIPASE, BLOOD: Lipase: 39 U/L (ref 11–51)

## 2021-09-17 LAB — LACTIC ACID, PLASMA
Lactic Acid, Venous: 0.9 mmol/L (ref 0.5–1.9)
Lactic Acid, Venous: 1.2 mmol/L (ref 0.5–1.9)

## 2021-09-17 LAB — RESP PANEL BY RT-PCR (FLU A&B, COVID) ARPGX2
Influenza A by PCR: NEGATIVE
Influenza B by PCR: NEGATIVE
SARS Coronavirus 2 by RT PCR: NEGATIVE

## 2021-09-17 LAB — COMPREHENSIVE METABOLIC PANEL
ALT: 43 U/L (ref 0–44)
AST: 43 U/L — ABNORMAL HIGH (ref 15–41)
Albumin: 3.7 g/dL (ref 3.5–5.0)
Alkaline Phosphatase: 68 U/L (ref 38–126)
Anion gap: 7 (ref 5–15)
BUN: 12 mg/dL (ref 8–23)
CO2: 24 mmol/L (ref 22–32)
Calcium: 7.9 mg/dL — ABNORMAL LOW (ref 8.9–10.3)
Chloride: 110 mmol/L (ref 98–111)
Creatinine, Ser: 0.72 mg/dL (ref 0.44–1.00)
GFR, Estimated: >60 mL/min (ref >60)
Glucose, Bld: 116 mg/dL — ABNORMAL HIGH (ref 70–99)
Potassium: 2.9 mmol/L — ABNORMAL LOW (ref 3.5–5.1)
Sodium: 141 mmol/L (ref 135–145)
Total Bilirubin: 0.9 mg/dL (ref 0.3–1.2)
Total Protein: 6.5 g/dL (ref 6.5–8.1)

## 2021-09-17 LAB — URINALYSIS, ROUTINE W REFLEX MICROSCOPIC
Bilirubin Urine: NEGATIVE
Glucose, UA: NEGATIVE mg/dL
Hgb urine dipstick: NEGATIVE
Ketones, ur: NEGATIVE mg/dL
Leukocytes,Ua: NEGATIVE
Nitrite: NEGATIVE
Protein, ur: NEGATIVE mg/dL
Specific Gravity, Urine: 1.003 — ABNORMAL LOW (ref 1.005–1.030)
pH: 8 (ref 5.0–8.0)

## 2021-09-17 LAB — TROPONIN I (HIGH SENSITIVITY)
Troponin I (High Sensitivity): 4 ng/L (ref <18)
Troponin I (High Sensitivity): 5 ng/L (ref <18)

## 2021-09-17 LAB — PROTIME-INR
INR: 1 (ref 0.8–1.2)
Prothrombin Time: 13.4 seconds (ref 11.4–15.2)

## 2021-09-17 MED ORDER — ONDANSETRON 4 MG PO TBDP: 4.0000 mg | ORAL_TABLET | ORAL | 0 refills | Status: AC | PRN

## 2021-09-17 MED ORDER — POTASSIUM CHLORIDE 10 MEQ/100ML IV SOLN
10.0000 meq | INTRAVENOUS | Status: AC
  Administered 2021-09-17 (×2): 10 meq via INTRAVENOUS
  Filled 2021-09-17 (×2): qty 100

## 2021-09-17 MED ORDER — LACTATED RINGERS IV BOLUS
500.0000 mL | Freq: Once | INTRAVENOUS | Status: AC
  Administered 2021-09-17: 500 mL via INTRAVENOUS

## 2021-09-17 MED ORDER — POTASSIUM CHLORIDE CRYS ER 20 MEQ PO TBCR
20.0000 meq | EXTENDED_RELEASE_TABLET | Freq: Once | ORAL | Status: AC
  Administered 2021-09-17: 20 meq via ORAL
  Filled 2021-09-17: qty 1

## 2021-09-17 MED ORDER — DIAZEPAM 5 MG/ML IJ SOLN
2.5000 mg | Freq: Once | INTRAMUSCULAR | Status: AC
  Administered 2021-09-17: 2.5 mg via INTRAVENOUS
  Filled 2021-09-17: qty 2

## 2021-09-17 MED ORDER — MECLIZINE HCL 25 MG PO TABS: 25.0000 mg | ORAL_TABLET | Freq: Three times a day (TID) | ORAL | 0 refills | Status: AC | PRN

## 2021-09-17 MED ORDER — MECLIZINE HCL 25 MG PO TABS
25.0000 mg | ORAL_TABLET | Freq: Once | ORAL | Status: AC
  Administered 2021-09-17: 25 mg via ORAL
  Filled 2021-09-17: qty 1

## 2021-09-17 MED ORDER — ONDANSETRON HCL 4 MG/2ML IJ SOLN
4.0000 mg | Freq: Once | INTRAMUSCULAR | Status: AC
  Administered 2021-09-17: 4 mg via INTRAVENOUS
  Filled 2021-09-17: qty 2

## 2021-09-17 MED ORDER — POTASSIUM CHLORIDE CRYS ER 20 MEQ PO TBCR: 20.0000 meq | EXTENDED_RELEASE_TABLET | Freq: Two times a day (BID) | ORAL | 0 refills | Status: AC

## 2021-09-17 NOTE — ED Notes (Signed)
Patient transported to MRI 

## 2021-09-17 NOTE — ED Provider Notes (Addendum)
[facility] DEPT Provider Note   CSN: [id number] Arrival date & time: 09/17/21  0935     History  No chief complaint on file.   [name] is a 76 y.o. female.  HPI history of hypertension, GERD, CVA 7\2022 with subsequent transcarotid arterial revascularization 8\2022.  Patient was seen at urgent care 3 days ago and diagnosed with viral respiratory illness.  Patient was treated symptomatically with Tessalon Perles.  At that time patient reported 4 days of symptoms with chest tightness and coughing but denied fever, shortness of breath or body aches.  Chest x-ray was done at that time with no acute findings.  Patient reports after she was seen at urgent care, the cough did get somewhat better.  She does not feel that she is coughing that much and does not feel short of breath at this time.  Patient reports that what happened and she started to get very dizzy and nauseated.  She reports has been throwing up all night.  She reports she is really dizzy if she turns or moves her head.  There is a spinning quality.  No headache.  Patient does have prior history of stroke but no noted new areas of weakness numbness or tingling.  Patient reports she also feels that she has some abdominal pain.  She has discomfort in her right lower abdomen and central upper abdomen.  She reports that pain gets a lot worse when she throws up.  He denies she is having any fevers that she is aware of.  She denies pain or burning with urination.  Patient had tried coining to relieve symptoms, thus has many linear marks on her upper back chest and lower back.  Patient is seen by herself at this time.  She reports her family members are currently at work and she is supposed to call them when she is feeling better.    Home Medications Prior to Admission medications   Medication Sig Start Date End Date Taking? Authorizing Provider  meclizine (ANTIVERT) 25 MG tablet Take 1 tablet (25 mg  total) by mouth 3 (three) times daily as needed for dizziness. 09/17/21  Yes Charlesetta Shanks, MD  ondansetron (ZOFRAN-ODT) 4 MG disintegrating tablet Take 1 tablet (4 mg total) by mouth every 4 (four) hours as needed for nausea or vomiting. 09/17/21  Yes Jashanti Clinkscale, Jeannie Done, MD  potassium chloride SA (KLOR-CON M) 20 MEQ tablet Take 1 tablet (20 mEq total) by mouth 2 (two) times daily. 09/17/21  Yes Charlesetta Shanks, MD  acetaminophen (TYLENOL) 325 MG tablet Take 650 mg by mouth every 6 (six) hours as needed for mild pain or headache.    [provider]  amitriptyline (ELAVIL) 50 MG tablet Take 1 tablet (50 mg total) by mouth at bedtime. Please schedule an office visit for further refills.  Thank you 08/06/21   Gatha Mayer, MD  amLODipine (NORVASC) 10 MG tablet Take 10 mg by mouth daily.      [provider]  aspirin EC 81 MG EC tablet Take 1 tablet (81 mg total) by mouth daily. Swallow whole. 11/14/20 11/09/21  Darliss Cheney, MD  benzonatate (TESSALON) 100 MG capsule Take 1 capsule (100 mg total) by mouth every 8 (eight) hours. 09/14/21   Ward, Lenise Arena, PA-C  Cholecalciferol (VITAMIN D3 PO) Take 1 capsule by mouth daily with breakfast.    [provider]  meclizine (ANTIVERT) 12.5 MG tablet Take 12.5 mg by mouth 2 (two) times daily as  needed for dizziness. 10/27/20   [provider]  ondansetron (ZOFRAN ODT) 8 MG disintegrating tablet Take 1 tablet (8 mg total) by mouth every 8 (eight) hours as needed for nausea or vomiting. 03/02/21   Lajean Saver, MD  pantoprazole (PROTONIX) 40 MG tablet Take 1 tablet (40 mg total) by mouth daily. Please schedule a follow up for further refills.  Last seen in 2021 08/06/21   Gatha Mayer, MD  rosuvastatin (CRESTOR) 20 MG tablet Take 1 tablet (20 mg total) by mouth daily. 09/10/21   Gabriel Earing, PA-C      Allergies    Patient has no known allergies.    Review of Systems   Review of Systems 10 systems reviewed negative except as  per HPI Physical Exam Updated Vital Signs BP 130/69   Pulse 73   Temp 97.8 F (36.6 C) (Oral)   Resp 11   Ht '5\' 1"'$  (1.549 m)   Wt 42 kg   SpO2 95%   BMI 17.50 kg/m  Physical Exam Constitutional:      Comments: Patient is alert with clear mental status.  She does appear slightly frail and pale.  Follows commands appropriately.  She appears mildly tachypneic but no respiratory distress  HENT:     Mouth/Throat:     Mouth: Mucous membranes are moist.     Pharynx: Oropharynx is clear.  Eyes:     Extraocular Movements: Extraocular movements intact.     Pupils: Pupils are equal, round, and reactive to light.  Cardiovascular:     Rate and Rhythm: Normal rate and regular rhythm.  Pulmonary:     Comments: Mild tachypnea.  Breath sounds are slightly soft but symmetric.  No gross wheeze rhonchi or rails. Abdominal:     Comments: Abdomen soft without guarding.  Patient does endorse discomfort in the right lower abdomen in the epigastrium.  No appreciable masses  Musculoskeletal:        General: Normal range of motion.     Cervical back: Neck supple.     Comments: No peripheral edema.  Calves are soft and nontender.  Extremities are thin.  Feet are in good condition with no wounds or redness or swelling.  Skin:    General: Skin is warm and dry.     Comments: See attached image for linear abrasions consistent with coining.  Skin otherwise normal without rashes.  Neurological:     General: No focal deficit present.     Mental Status: She is oriented to person, place, and time.     Coordination: Coordination normal.     Comments: Patient is generally weak but able to follow commands to reposition to both sides in the stretcher to assist in examination.  There are no focal motor deficits present.  Speech content is situationally normal.     ED Results / Procedures / Treatments   Labs (all labs ordered are listed, but only abnormal results are displayed) Labs Reviewed  COMPREHENSIVE  METABOLIC PANEL - Abnormal; Notable for the following components:      Result Value   Potassium 2.9 (*)    Glucose, Bld 116 (*)    Calcium 7.9 (*)    AST 43 (*)    All other components within normal limits  CBC WITH DIFFERENTIAL/PLATELET - Abnormal; Notable for the following components:   Hemoglobin 11.8 (*)    HCT 35.1 (*)    Platelets 148 (*)    Lymphs Abs 0.4 (*)    All  other components within normal limits  URINALYSIS, ROUTINE W REFLEX MICROSCOPIC - Abnormal; Notable for the following components:   Color, Urine COLORLESS (*)    Specific Gravity, Urine 1.003 (*)    All other components within normal limits  RESP PANEL BY RT-PCR (FLU A&B, COVID) ARPGX2  LACTIC ACID, PLASMA  LACTIC ACID, PLASMA  LIPASE, BLOOD  PROTIME-INR  TROPONIN I (HIGH SENSITIVITY)  TROPONIN I (HIGH SENSITIVITY)    EKG None  Radiology DG Chest 1 View  Result Date: 09/17/2021 CLINICAL DATA:  Cough. EXAM: CHEST  1 VIEW COMPARISON:  09/14/2021 FINDINGS: The cardiac silhouette, mediastinal and hilar contours are normal. The lungs demonstrate hyperinflation and stable emphysematous changes but no acute overlying pulmonary process. No pleural effusions. No pulmonary lesions. The bony thorax is intact. IMPRESSION: Chronic emphysematous changes but no acute overlying pulmonary process. Electronically Signed   By: Marijo Sanes M.D.   On: 09/17/2021 12:25   CT Head Wo Contrast  Result Date: 09/17/2021 CLINICAL DATA:  Dizziness. EXAM: CT HEAD WITHOUT CONTRAST TECHNIQUE: Contiguous axial images were obtained from the base of the skull through the vertex without intravenous contrast. RADIATION DOSE REDUCTION: This exam was performed according to the departmental dose-optimization program which includes automated exposure control, adjustment of the mA and/or kV according to patient size and/or use of iterative reconstruction technique. COMPARISON:  March 02, 2021. FINDINGS: Brain: No evidence of acute infarction,  hemorrhage, hydrocephalus, extra-axial collection or mass lesion/mass effect. Vascular: No hyperdense vessel or unexpected calcification. Skull: Normal. Negative for fracture or focal lesion. Sinuses/Orbits: No acute finding. Other: None. IMPRESSION: No acute intracranial abnormality seen. Electronically Signed   By: Marijo Conception M.D.   On: 09/17/2021 10:47   MR BRAIN WO CONTRAST  Result Date: 09/17/2021 CLINICAL DATA:  Provided history: Neuro deficit, acute, stroke suspected. EXAM: MRI HEAD WITHOUT CONTRAST TECHNIQUE: Multiplanar, multiecho pulse sequences of the brain and surrounding structures were obtained without intravenous contrast. COMPARISON:  Prior head CT examinations 09/17/2021 and earlier. Brain MRI 11/09/2020. Brain MRI 11/05/2020. FINDINGS: Brain: Mild generalized cerebral atrophy. Redemonstrated patchy chronic cortical/subcortical infarcts within the right frontoparietal lobes (within the right MCA territory, as well as right MCA/ACA and right MCA/PCA watershed territories). Minimal chronic hemosiderin deposition at site of these infarcts. Background mild multifocal T2 FLAIR hyperintense signal abnormality within the cerebral white matter, nonspecific but compatible with chronic small vessel ischemic disease. There is no acute infarct. No evidence of an intracranial mass. No extra-axial fluid collection. No midline shift. Vascular: Maintained flow voids within the proximal large arterial vessels. Skull and upper cervical spine: No focal suspicious marrow lesion. Sinuses/Orbits: No mass or acute finding within the imaged orbits. Prior bilateral ocular lens replacement. Mild mucosal thickening versus small mucous retention cyst within the left sphenoid sinus. Minimal mucosal thickening within the bilateral ethmoid sinuses. IMPRESSION: No evidence of acute intracranial abnormality. Known chronic cortical/subcortical infarcts within the right frontoparietal lobes (within the right MCA vascular  territory, as well as right MCA/ACA and right MCA/PCA watershed territories). Background mild chronic small vessel ischemic changes within the cerebral white matter, similar to the prior brain MRI of 11/05/2020. Mild generalized cerebral atrophy. Mild paranasal sinus disease, as described. Electronically Signed   By: Kellie Simmering D.O.   On: 09/17/2021 15:53    Procedures Procedures    Medications Ordered in ED Medications  potassium chloride 10 mEq in 100 mL IVPB (10 mEq Intravenous Not Given 09/17/21 1723)  potassium chloride SA (KLOR-CON M) CR tablet 20  mEq (has no administration in time range)  lactated ringers bolus 500 mL (0 mLs Intravenous Stopped 09/17/21 1322)  ondansetron (ZOFRAN) injection 4 mg (4 mg Intravenous Given 09/17/21 1051)  meclizine (ANTIVERT) tablet 25 mg (25 mg Oral Given 09/17/21 1054)  diazepam (VALIUM) injection 2.5 mg (2.5 mg Intravenous Given 09/17/21 1459)    ED Course/ Medical Decision Making/ A&P                           Medical Decision Making Amount and/or Complexity of Data Reviewed Labs: ordered. Radiology: ordered.  Risk Prescription drug management.   Patient had been seen 3 days ago with suspected viral respiratory illness.  At this time patient is describing those symptoms as improved but now has significant vertigo vomiting and nausea and vomiting all night.  He also endorses abdominal discomfort.  We will proceed with diagnostic evaluation.  With patient's age we will proceed with CT abdomen and pelvis for pain with vomiting.  Possible pancreatitis\bowel obstruction\other surgical abdominal process.  At this time lower suspicion for ischemic bowel.  Patient's pain does not seem disproportionate to symptoms.  Predominant symptom is nausea and vomiting with vertigo.  We will also proceed with CT head with history of multi-infarct as well as benign vertigo.  We will start symptomatic treatment for vertigo with meclizine Zofran.  CT head reviewed by radiology  without acute findings.  Labs show mild hypokalemia at 2.9.  Will replace with peripheral IV potassium given patient's nausea and vomiting.  12: 32 reassessment after meclizine and Zofran.  Patient reports she does feel some improvement but still feels dizzy if she moves.  She has not had further vomiting but has some persistent nausea.  She remains alert and answers questions without difficulty.  Vital signs remained stable.  We will give Valium 2.5 mg for vertigo and proceed with MRI to rule out acute cerebellar stroke.  Patient does have significant CVA history and at this point with history of both vertigo as well as CVA it is difficult to differentiate symptoms as being peripheral versus central.  MRI has returned without any evidence of acute stroke.  Patient has mild hypokalemia.  Likely secondary to acute vomiting.  Will replace with peripheral IV potassium.  16: 50 patient rechecked.  Symptoms are improved.  She reports she does frequently have dizziness off and on and at this time her nausea is better and she has not had any further vomiting.  I have called the patient's daughter on the patient's cell phone.  We discussed the findings.  Patient's daughter reports that they have family members at home to help with administration of medications and supervision.  She feels comfortable taking the patient home.  Patient's son-in-law will be coming to pick her up.  I reviewed this plan with the patient.  She reports she is ready to go home.  At this time with cerebellar CVA ruled out, stable vital signs and patient symptomatically improved, I feel she is appropriate for continued home management.  We will prescribe Antivert and Zofran on as-needed basis.  Patient had hypokalemia at 2.9.  At this time she has had replacement in the emergency department and will give additional oral replacement.  I suspect this is secondary to vomiting.  We will have the patient's start twice daily potassium for the next  several days with recommendation for follow-up with PCP and repeat potassium testing to determine if this is a needed ongoing  prescription versus temporary replacement. I do recommend close follow-up for recheck as well as return if any concerning or new symptoms        Final Clinical Impression(s) / ED Diagnoses Final diagnoses:  Vertigo  Nausea and vomiting, unspecified vomiting type  Severe comorbid illness  Hypokalemia    Rx / DC Orders ED Discharge Orders          Ordered    meclizine (ANTIVERT) 25 MG tablet  3 times daily PRN        09/17/21 1654    ondansetron (ZOFRAN-ODT) 4 MG disintegrating tablet  Every 4 hours PRN        09/17/21 1654    potassium chloride SA (KLOR-CON M) 20 MEQ tablet  2 times daily        09/17/21 1722              Charlesetta Shanks, MD 09/17/21 1657    Charlesetta Shanks, MD 09/17/21 1725

## 2021-09-17 NOTE — Discharge Instructions (Addendum)
1.  Take meclizine as prescribed every 8 hours if needed for dizziness.  If you do not feel dizzy, do not take the medicine. 2.  Take Zofran if needed for nausea. 3.  Schedule a recheck with your doctor as soon as possible. 4.  Return to the emergency department if you have new worsening or concerning symptoms 5.  Your potassium was low today.  You have been prescribed a potassium tablet to take twice a day.  Take this twice a day and have your doctor recheck your levels within the next 3 to 7 days.

## 2021-09-17 NOTE — ED Notes (Signed)
Patient transported to CT 

## 2021-09-17 NOTE — ED Triage Notes (Signed)
Pt BIB [facility] from home. Patient seen last Thursday for generalized weakness and cough/runny nose. Patient family states that patient has not improved since. Patient was prescribed cough medication without relief. EMS gave 4 of zofran IV.

## 2021-10-05 ENCOUNTER — Other Ambulatory Visit: Payer: Self-pay | Admitting: Internal Medicine

## 2021-11-04 ENCOUNTER — Other Ambulatory Visit: Payer: Self-pay | Admitting: Internal Medicine

## 2021-11-04 ENCOUNTER — Other Ambulatory Visit: Payer: Self-pay

## 2021-11-04 MED ORDER — ASPIRIN 81 MG PO TBEC: 81.0000 mg | DELAYED_RELEASE_TABLET | Freq: Every day | ORAL | 11 refills | Status: DC

## 2021-12-04 ENCOUNTER — Other Ambulatory Visit: Payer: Self-pay | Admitting: Internal Medicine

## 2022-01-03 ENCOUNTER — Other Ambulatory Visit: Payer: Self-pay | Admitting: Internal Medicine

## 2022-02-02 ENCOUNTER — Other Ambulatory Visit: Payer: Self-pay | Admitting: Internal Medicine

## 2022-03-04 ENCOUNTER — Other Ambulatory Visit: Payer: Self-pay | Admitting: Internal Medicine

## 2022-06-10 ENCOUNTER — Other Ambulatory Visit: Payer: Self-pay | Admitting: Internal Medicine

## 2022-07-22 ENCOUNTER — Other Ambulatory Visit: Payer: Self-pay | Admitting: Internal Medicine

## 2022-07-22 ENCOUNTER — Ambulatory Visit
Admission: RE | Admit: 2022-07-22 | Discharge: 2022-07-22 | Disposition: A | Discharge status: Home / Self Care | Payer: Medicare Other | Source: Ambulatory Visit | Attending: Internal Medicine | Admitting: Internal Medicine

## 2022-07-22 DIAGNOSIS — R519 Headache, unspecified: Secondary | ICD-10-CM

## 2022-07-22 DIAGNOSIS — Z8673 Personal history of transient ischemic attack (TIA), and cerebral infarction without residual deficits: Secondary | ICD-10-CM

## 2022-07-22 DIAGNOSIS — R531 Weakness: Secondary | ICD-10-CM

## 2022-08-03 IMAGING — CT CT ANGIO HEAD-NECK (W OR W/O PERF)
1 of 10 series · 6 of 33 positions shown · IV contrast (omnipaque)
Comparison: None.

CLINICAL DATA: Ataxia, stroke suspected

EXAM:
CT ANGIOGRAPHY HEAD AND NECK
TECHNIQUE: Multidetector CT imaging of the head and neck was performed using
the standard protocol during bolus administration of intravenous
contrast. Multiplanar CT image reconstructions and MIPs were
obtained to evaluate the vascular anatomy. Carotid stenosis
measurements (when applicable) are obtained utilizing NASCET
criteria, using the distal internal carotid diameter as the
denominator.
CONTRAST:  75mL OMNIPAQUE IOHEXOL 350 MG/ML SOLN

[Series 11: ax thin · axial · 0.51mm/px · z∈[-271,-34]mm · 6 of 309 slices shown]
[im 45/309  soft-tissue]
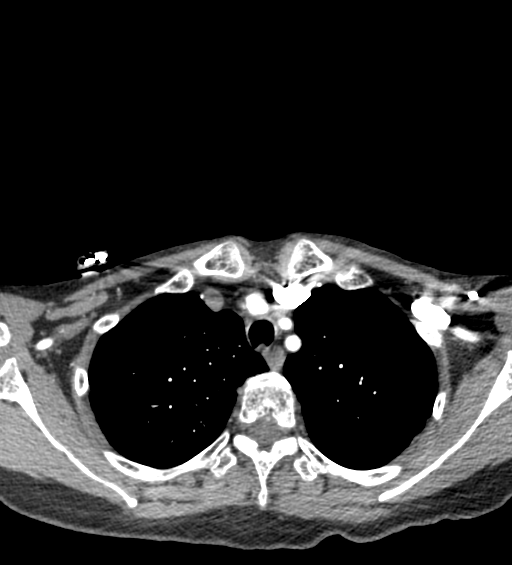
[im 89/309  bone]
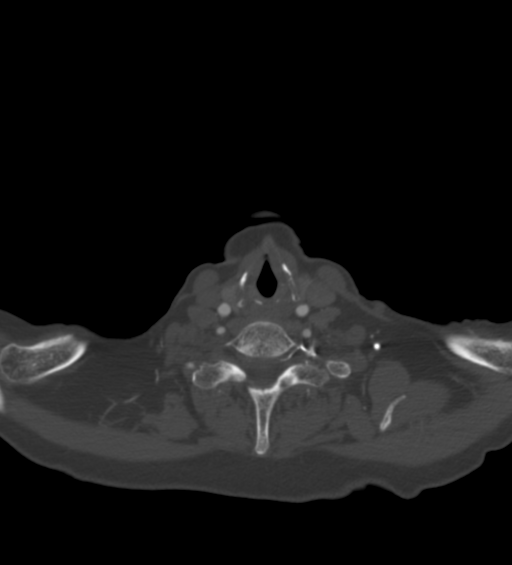
[im 133/309  soft-tissue]
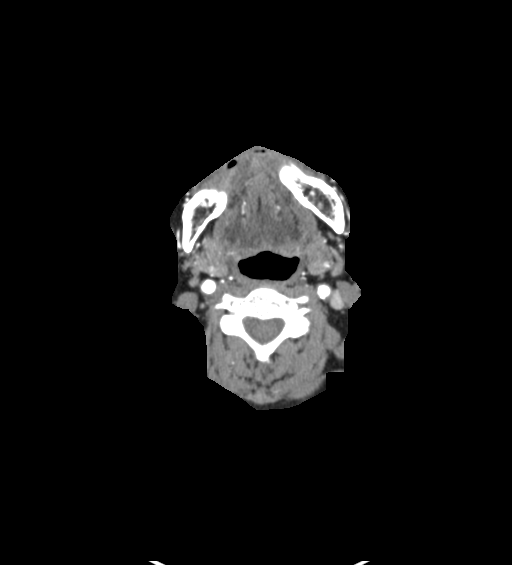
[im 177/309  bone]
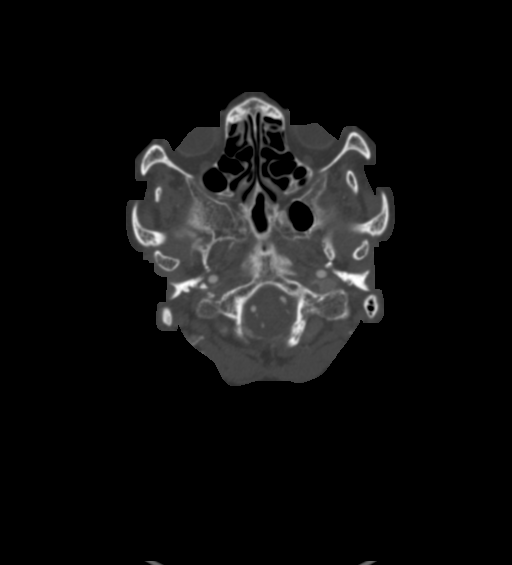
[im 221/309  soft-tissue]
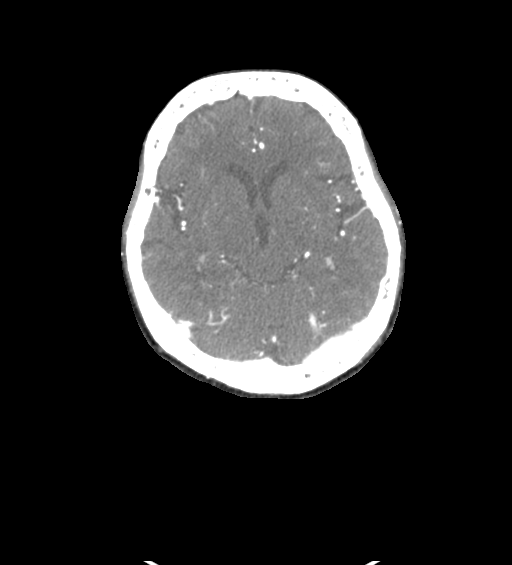
[im 265/309  bone]
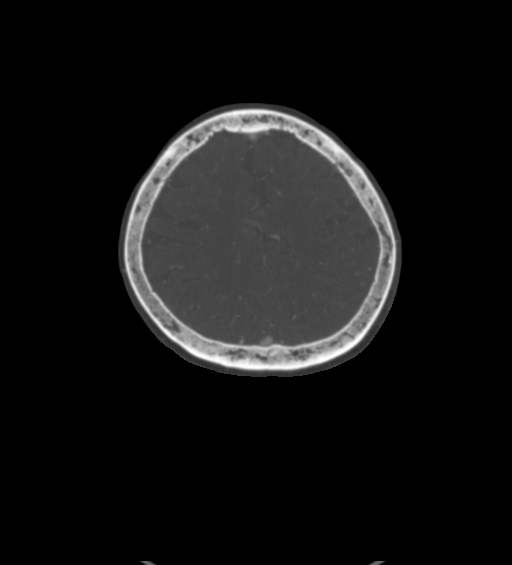

[6 of 33 positions shown; findings below may reference images not displayed]

FINDINGS: CT HEAD FINDINGS

Brain: There is no mass, hemorrhage or extra-axial collection. The
size and configuration of the ventricles and extra-axial CSF spaces
are normal. There is no acute or chronic infarction. The brain
parenchyma is normal.

Skull: The visualized skull base, calvarium and extracranial soft
tissues are normal.

Sinuses/Orbits: No fluid levels or advanced mucosal thickening of
the visualized paranasal sinuses. No mastoid or middle ear effusion.
The orbits are normal.

CTA NECK FINDINGS

SKELETON: There is no bony spinal canal stenosis. No lytic or
blastic lesion.

OTHER NECK: Normal pharynx, larynx and major salivary glands. No
cervical lymphadenopathy. Unremarkable thyroid gland.

UPPER CHEST: No pneumothorax or pleural effusion. No nodules or
masses.

AORTIC ARCH:

There is calcific atherosclerosis of the aortic arch. There is no
aneurysm, dissection or hemodynamically significant stenosis of the
visualized portion of the aorta. Conventional 3 vessel aortic
branching pattern. The visualized proximal subclavian arteries are
widely patent.

RIGHT CAROTID SYSTEM: No dissection, occlusion or aneurysm. There is
mixed density atherosclerosis extending into the proximal ICA,
resulting in 80% stenosis.

LEFT CAROTID SYSTEM: Normal without aneurysm, dissection or
stenosis.

VERTEBRAL ARTERIES: Left dominant configuration. Both origins are
clearly patent. There is no dissection, occlusion or flow-limiting
stenosis to the skull base (V1-V3 segments).

CTA HEAD FINDINGS

POSTERIOR CIRCULATION:

--Vertebral arteries: Normal V4 segments.

--Inferior cerebellar arteries: Normal.

--Basilar artery: Normal.

--Superior cerebellar arteries: Normal.

--Posterior cerebral arteries (PCA): Normal.

ANTERIOR CIRCULATION:

--Intracranial internal carotid arteries: Normal.

--Anterior cerebral arteries (ACA): Normal. Both A1 segments are
present. Patent anterior communicating artery (a-comm).

--Middle cerebral arteries (MCA): Normal.

VENOUS SINUSES: As permitted by contrast timing, patent.

ANATOMIC VARIANTS: None

Review of the MIP images confirms the above findings.
IMPRESSION: 1. No intracranial arterial occlusion or high-grade stenosis.
2. 80% stenosis of the proximal right internal carotid artery
secondary to mixed density atherosclerosis.

Aortic Atherosclerosis ([id number]).

## 2022-08-08 IMAGING — US US ABDOMEN LIMITED
1 series · 14 of 25 positions shown · non-contrast
Comparison: March 05, 2014.

CLINICAL DATA: Transaminitis.

EXAM:
ULTRASOUND ABDOMEN LIMITED RIGHT UPPER QUADRANT

[Series 1: us abdomen limited ruq (liver/gb) · 14 of 37 slices shown]
[im 1/37]
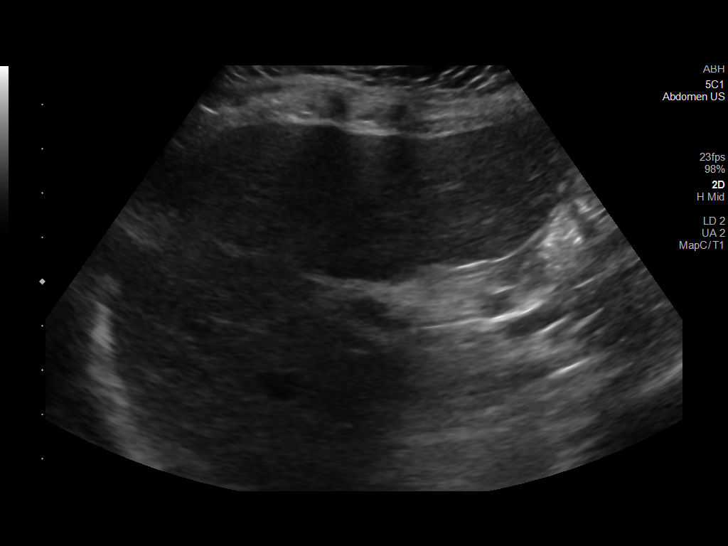
[im 4/37]
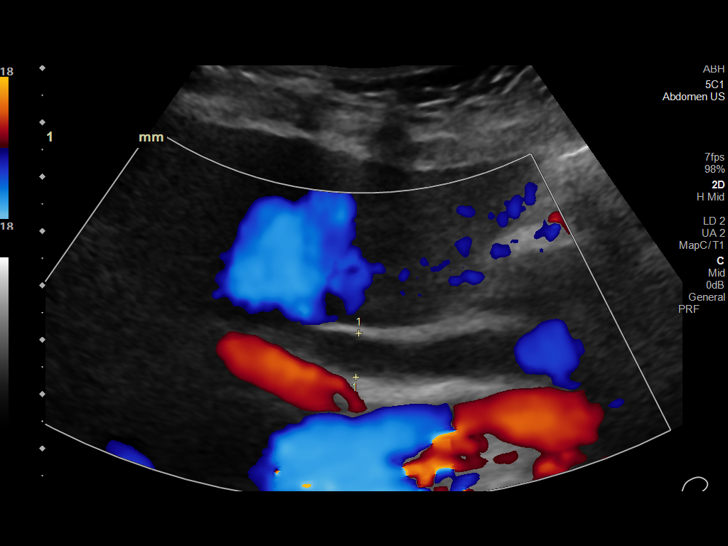
[im 7/37]
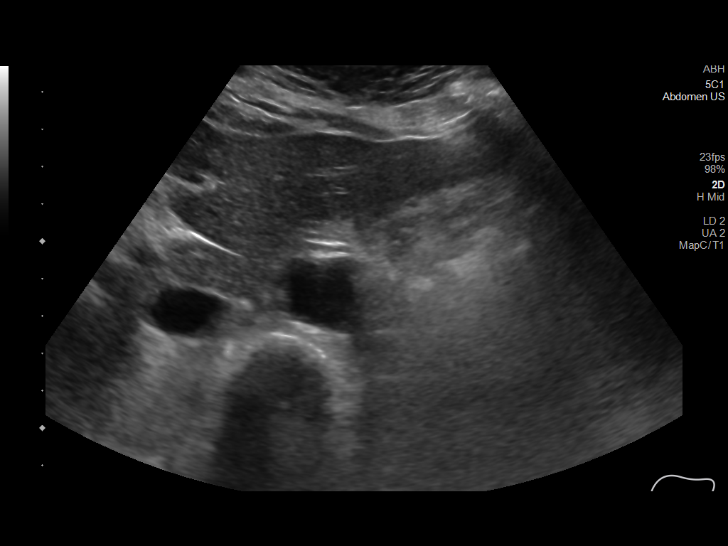
[im 10/37]
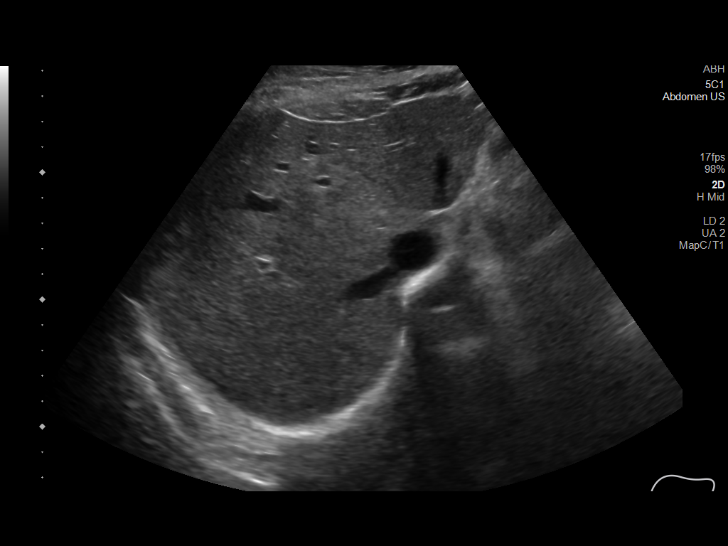
[im 13/37]
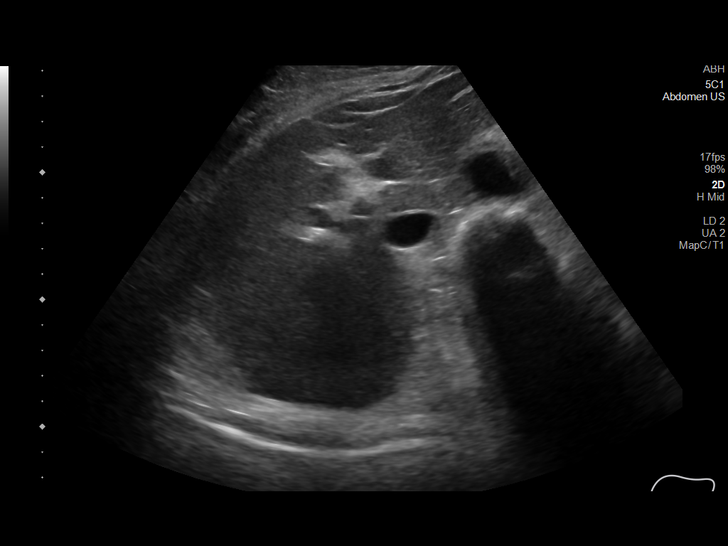
[im 14/37]
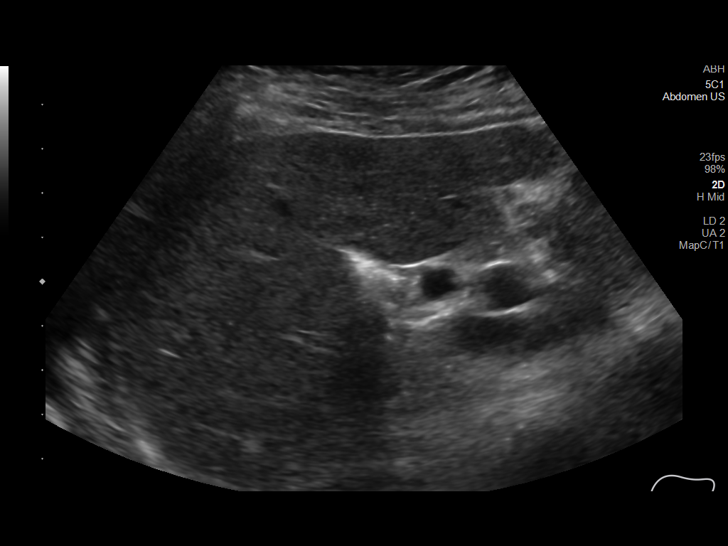
[im 17/37]
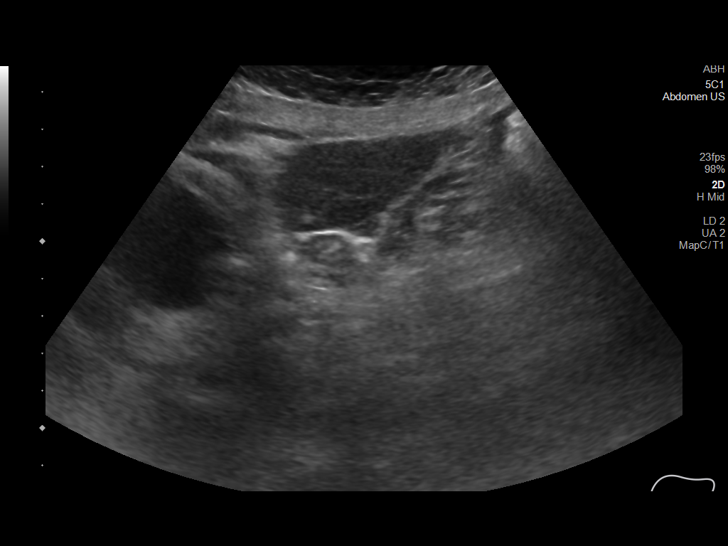
[im 20/37]
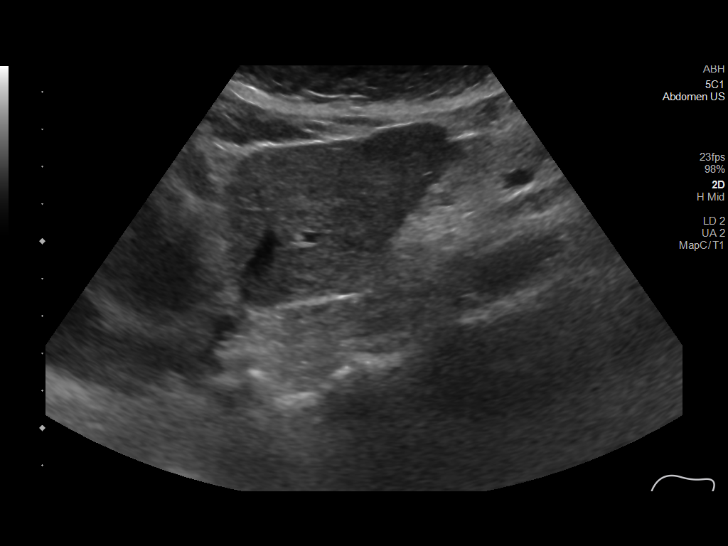
[im 23/37]
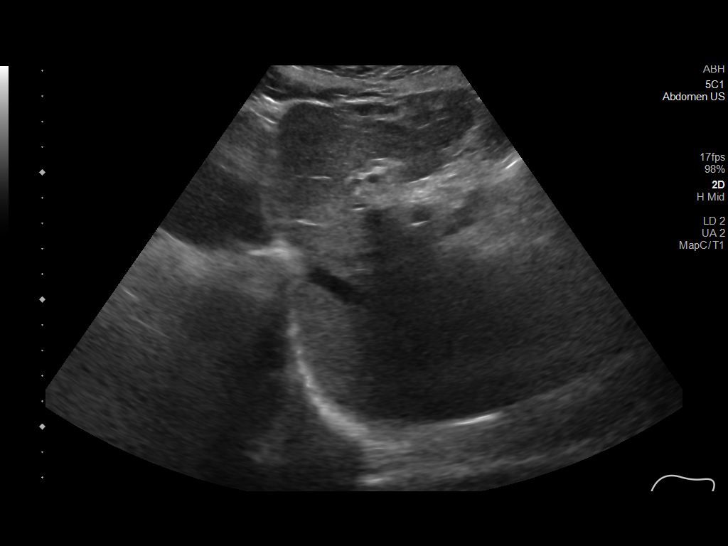
[im 25/37]
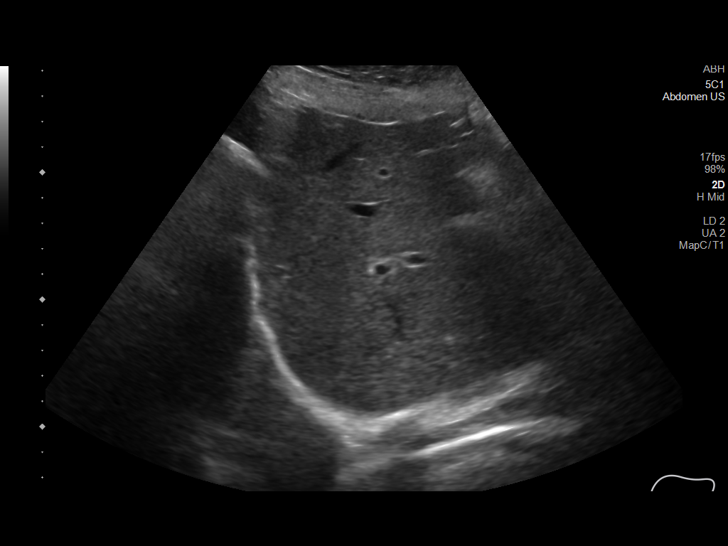
[im 28/37]
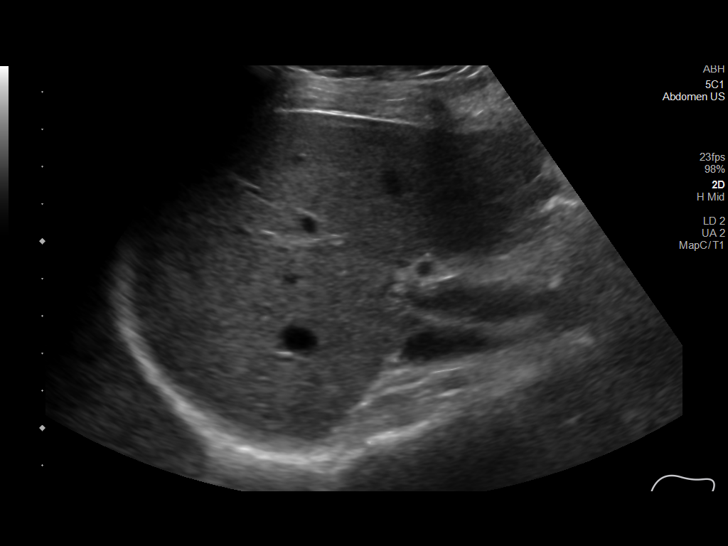
[im 31/37]
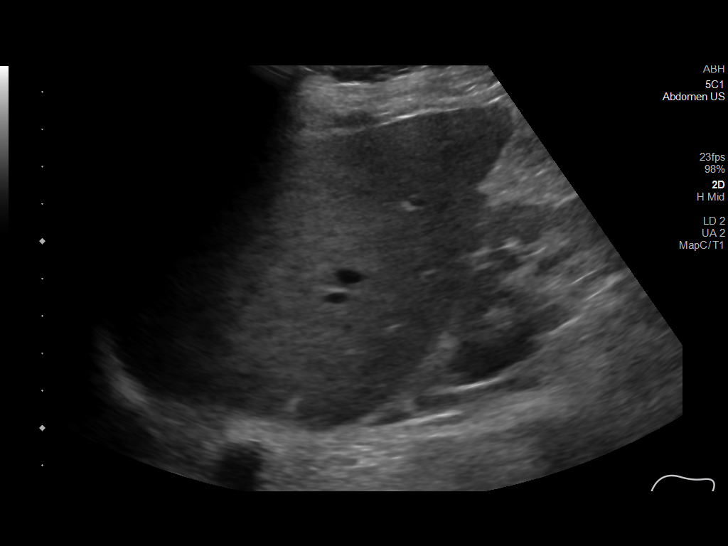
[im 34/37]
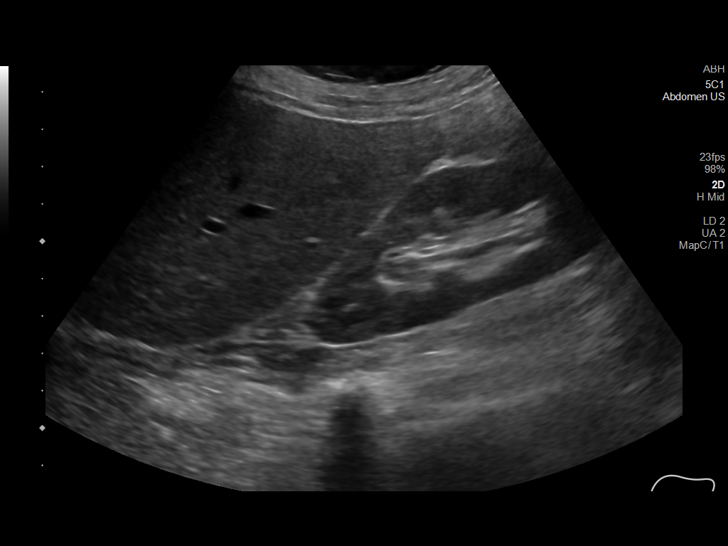
[im 37/37]
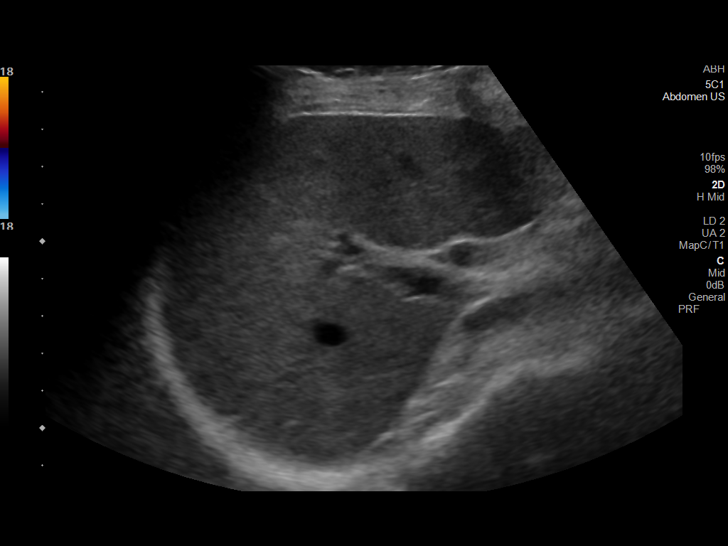

[14 of 25 positions shown; findings below may reference images not displayed]

FINDINGS: Gallbladder:

Status post cholecystectomy.

Common bile duct:

Diameter: 8 mm which is within normal limits given post
cholecystectomy status.

Liver:

No focal lesion identified. Within normal limits in parenchymal
echogenicity. Portal vein is patent on color Doppler imaging with
normal direction of blood flow towards the liver.

Other: None.
IMPRESSION: Status post cholecystectomy. No other abnormality seen in the right
upper quadrant of the abdomen.

## 2022-09-17 ENCOUNTER — Other Ambulatory Visit: Payer: Self-pay | Admitting: Physician Assistant

## 2022-11-22 ENCOUNTER — Other Ambulatory Visit: Payer: Self-pay | Admitting: Internal Medicine

## 2022-11-22 ENCOUNTER — Ambulatory Visit
Admission: RE | Admit: 2022-11-22 | Discharge: 2022-11-22 | Disposition: A | Discharge status: Home / Self Care | Payer: Medicare Other | Source: Ambulatory Visit | Attending: Internal Medicine | Admitting: Internal Medicine

## 2022-11-22 DIAGNOSIS — M25551 Pain in right hip: Secondary | ICD-10-CM

## 2022-11-22 DIAGNOSIS — R0602 Shortness of breath: Secondary | ICD-10-CM

## 2022-12-01 ENCOUNTER — Other Ambulatory Visit (HOSPITAL_COMMUNITY): Payer: Self-pay | Admitting: Internal Medicine

## 2022-12-01 DIAGNOSIS — R0602 Shortness of breath: Secondary | ICD-10-CM

## 2022-12-11 ENCOUNTER — Other Ambulatory Visit: Payer: Self-pay | Admitting: Internal Medicine

## 2022-12-17 ENCOUNTER — Other Ambulatory Visit: Payer: Self-pay | Admitting: Vascular Surgery

## 2022-12-22 ENCOUNTER — Ambulatory Visit (HOSPITAL_COMMUNITY): Payer: Medicare Other | Attending: Internal Medicine

## 2022-12-22 DIAGNOSIS — R0602 Shortness of breath: Secondary | ICD-10-CM | POA: Diagnosis not present

## 2022-12-22 LAB — ECHOCARDIOGRAM COMPLETE
Area-P 1/2: 3.12 cm2
S' Lateral: 1.7 cm

## 2023-01-14 ENCOUNTER — Other Ambulatory Visit: Payer: Self-pay | Admitting: Vascular Surgery

## 2023-01-16 ENCOUNTER — Other Ambulatory Visit: Payer: Self-pay | Admitting: Vascular Surgery

## 2023-02-23 DIAGNOSIS — H353132 Nonexudative age-related macular degeneration, bilateral, intermediate dry stage: Secondary | ICD-10-CM | POA: Diagnosis not present

## 2023-02-23 DIAGNOSIS — H35372 Puckering of macula, left eye: Secondary | ICD-10-CM | POA: Diagnosis not present

## 2023-02-23 DIAGNOSIS — H43813 Vitreous degeneration, bilateral: Secondary | ICD-10-CM | POA: Diagnosis not present

## 2023-03-01 DIAGNOSIS — H35372 Puckering of macula, left eye: Secondary | ICD-10-CM | POA: Diagnosis not present

## 2023-03-01 DIAGNOSIS — H33312 Horseshoe tear of retina without detachment, left eye: Secondary | ICD-10-CM | POA: Diagnosis not present

## 2023-04-06 DIAGNOSIS — H353132 Nonexudative age-related macular degeneration, bilateral, intermediate dry stage: Secondary | ICD-10-CM | POA: Diagnosis not present

## 2023-06-01 DIAGNOSIS — Z79899 Other long term (current) drug therapy: Secondary | ICD-10-CM | POA: Diagnosis not present

## 2023-06-01 DIAGNOSIS — N1831 Chronic kidney disease, stage 3a: Secondary | ICD-10-CM | POA: Diagnosis not present

## 2023-06-01 DIAGNOSIS — Z8509 Personal history of malignant neoplasm of other digestive organs: Secondary | ICD-10-CM | POA: Diagnosis not present

## 2023-06-01 DIAGNOSIS — G629 Polyneuropathy, unspecified: Secondary | ICD-10-CM | POA: Diagnosis not present

## 2023-06-01 DIAGNOSIS — E119 Type 2 diabetes mellitus without complications: Secondary | ICD-10-CM | POA: Diagnosis not present

## 2023-06-01 DIAGNOSIS — Z8673 Personal history of transient ischemic attack (TIA), and cerebral infarction without residual deficits: Secondary | ICD-10-CM | POA: Diagnosis not present

## 2023-06-01 DIAGNOSIS — R200 Anesthesia of skin: Secondary | ICD-10-CM | POA: Diagnosis not present

## 2023-06-01 DIAGNOSIS — I10 Essential (primary) hypertension: Secondary | ICD-10-CM | POA: Diagnosis not present

## 2023-06-01 DIAGNOSIS — E559 Vitamin D deficiency, unspecified: Secondary | ICD-10-CM | POA: Diagnosis not present

## 2023-06-01 DIAGNOSIS — Z0000 Encounter for general adult medical examination without abnormal findings: Secondary | ICD-10-CM | POA: Diagnosis not present

## 2023-06-01 DIAGNOSIS — M79641 Pain in right hand: Secondary | ICD-10-CM | POA: Diagnosis not present

## 2023-06-01 DIAGNOSIS — E7800 Pure hypercholesterolemia, unspecified: Secondary | ICD-10-CM | POA: Diagnosis not present

## 2023-06-01 DIAGNOSIS — M810 Age-related osteoporosis without current pathological fracture: Secondary | ICD-10-CM | POA: Diagnosis not present

## 2023-06-01 DIAGNOSIS — R42 Dizziness and giddiness: Secondary | ICD-10-CM | POA: Diagnosis not present

## 2023-12-01 DIAGNOSIS — E119 Type 2 diabetes mellitus without complications: Secondary | ICD-10-CM | POA: Diagnosis not present

## 2023-12-01 DIAGNOSIS — I10 Essential (primary) hypertension: Secondary | ICD-10-CM | POA: Diagnosis not present

## 2023-12-01 DIAGNOSIS — R636 Underweight: Secondary | ICD-10-CM | POA: Diagnosis not present

## 2023-12-01 DIAGNOSIS — Z8673 Personal history of transient ischemic attack (TIA), and cerebral infarction without residual deficits: Secondary | ICD-10-CM | POA: Diagnosis not present

## 2023-12-01 DIAGNOSIS — Z681 Body mass index (BMI) 19 or less, adult: Secondary | ICD-10-CM | POA: Diagnosis not present

## 2023-12-01 DIAGNOSIS — R42 Dizziness and giddiness: Secondary | ICD-10-CM | POA: Diagnosis not present

## 2023-12-01 DIAGNOSIS — Z136 Encounter for screening for cardiovascular disorders: Secondary | ICD-10-CM | POA: Diagnosis not present

## 2023-12-01 DIAGNOSIS — K219 Gastro-esophageal reflux disease without esophagitis: Secondary | ICD-10-CM | POA: Diagnosis not present

## 2023-12-01 DIAGNOSIS — N1831 Chronic kidney disease, stage 3a: Secondary | ICD-10-CM | POA: Diagnosis not present
